# Patient Record
Sex: Male | Born: 1940 | Race: White | Hispanic: No | Marital: Single | Smoking: Former smoker
Health system: Southern US, Community
[De-identification: ages and names within clinical notes are randomized; demographics above are authoritative.]

## PROBLEM LIST (undated history)

## (undated) DIAGNOSIS — I509 Heart failure, unspecified: Secondary | ICD-10-CM

## (undated) DIAGNOSIS — M1990 Unspecified osteoarthritis, unspecified site: Secondary | ICD-10-CM

## (undated) DIAGNOSIS — N400 Enlarged prostate without lower urinary tract symptoms: Secondary | ICD-10-CM

## (undated) DIAGNOSIS — I10 Essential (primary) hypertension: Secondary | ICD-10-CM

## (undated) DIAGNOSIS — I499 Cardiac arrhythmia, unspecified: Secondary | ICD-10-CM

## (undated) DIAGNOSIS — I482 Chronic atrial fibrillation, unspecified: Secondary | ICD-10-CM

## (undated) DIAGNOSIS — E785 Hyperlipidemia, unspecified: Secondary | ICD-10-CM

## (undated) DIAGNOSIS — H547 Unspecified visual loss: Secondary | ICD-10-CM

## (undated) HISTORY — PX: KNEE SURGERY: SHX244

## (undated) HISTORY — PX: HERNIA REPAIR: SHX51

---

## 2004-01-20 ENCOUNTER — Other Ambulatory Visit: Payer: Self-pay

## 2009-10-21 ENCOUNTER — Emergency Department: Payer: Self-pay | Admitting: Emergency Medicine

## 2012-07-17 ENCOUNTER — Ambulatory Visit: Payer: Self-pay | Admitting: General Practice

## 2012-07-17 LAB — URINALYSIS, COMPLETE
Bilirubin,UR: NEGATIVE
Glucose,UR: NEGATIVE mg/dL (ref 0–75)
Ketone: NEGATIVE
Leukocyte Esterase: NEGATIVE
Specific Gravity: 1.026 (ref 1.003–1.030)
Squamous Epithelial: <1

## 2012-07-17 LAB — MRSA PCR SCREENING

## 2012-07-17 LAB — CBC
HCT: 43.7 % (ref 40.0–52.0)
HGB: 14.7 g/dL (ref 13.0–18.0)
MCH: 29.1 pg (ref 26.0–34.0)
MCHC: 33.6 g/dL (ref 32.0–36.0)
RBC: 5.05 10*6/uL (ref 4.40–5.90)
WBC: 6.2 10*3/uL (ref 3.8–10.6)

## 2012-07-17 LAB — BASIC METABOLIC PANEL
BUN: 23 mg/dL — ABNORMAL HIGH (ref 7–18)
Co2: 32 mmol/L (ref 21–32)
Creatinine: 1.13 mg/dL (ref 0.60–1.30)
EGFR (African American): >60
Glucose: 92 mg/dL (ref 65–99)
Sodium: 143 mmol/L (ref 136–145)

## 2012-07-17 LAB — PROTIME-INR: Prothrombin Time: 21.8 secs — ABNORMAL HIGH (ref 11.5–14.7)

## 2012-07-17 LAB — SEDIMENTATION RATE: Erythrocyte Sed Rate: 7 mm/hr (ref 0–20)

## 2012-08-14 ENCOUNTER — Inpatient Hospital Stay: Payer: Self-pay | Admitting: General Practice

## 2012-08-15 LAB — PROTIME-INR: INR: 1.3

## 2012-08-15 LAB — HEMOGLOBIN: HGB: 11.8 g/dL — ABNORMAL LOW (ref 13.0–18.0)

## 2012-08-15 LAB — BASIC METABOLIC PANEL
EGFR (African American): >60
EGFR (Non-African Amer.): >60
Sodium: 141 mmol/L (ref 136–145)

## 2012-08-15 LAB — PLATELET COUNT: Platelet: 131 10*3/uL — ABNORMAL LOW (ref 150–440)

## 2012-08-16 LAB — BASIC METABOLIC PANEL
Anion Gap: 2 — ABNORMAL LOW (ref 7–16)
BUN: 15 mg/dL (ref 7–18)
Calcium, Total: 7.8 mg/dL — ABNORMAL LOW (ref 8.5–10.1)
Chloride: 100 mmol/L (ref 98–107)
EGFR (African American): >60
Osmolality: 274 (ref 275–301)
Potassium: 3.1 mmol/L — ABNORMAL LOW (ref 3.5–5.1)
Sodium: 136 mmol/L (ref 136–145)

## 2012-08-16 LAB — PLATELET COUNT: Platelet: 138 10*3/uL — ABNORMAL LOW (ref 150–440)

## 2012-08-16 LAB — URIC ACID: Uric Acid: 6.7 mg/dL (ref 3.5–7.2)

## 2012-08-17 LAB — BASIC METABOLIC PANEL
Anion Gap: 0 — ABNORMAL LOW (ref 7–16)
Chloride: 99 mmol/L (ref 98–107)
Co2: 37 mmol/L — ABNORMAL HIGH (ref 21–32)
EGFR (African American): >60
EGFR (Non-African Amer.): 58 — ABNORMAL LOW

## 2012-08-18 ENCOUNTER — Encounter: Payer: Self-pay | Admitting: Internal Medicine

## 2012-08-18 LAB — PROTIME-INR
INR: 1.4
Prothrombin Time: 17.2 secs — ABNORMAL HIGH (ref 11.5–14.7)

## 2012-08-19 LAB — PROTIME-INR: Prothrombin Time: 19.3 secs — ABNORMAL HIGH (ref 11.5–14.7)

## 2012-08-22 ENCOUNTER — Encounter: Payer: Self-pay | Admitting: Internal Medicine

## 2013-01-02 ENCOUNTER — Ambulatory Visit: Payer: Self-pay | Admitting: General Practice

## 2013-01-02 LAB — APTT: Activated PTT: 31.8 secs (ref 23.6–35.9)

## 2013-01-02 LAB — CBC
HCT: 39.6 % — ABNORMAL LOW (ref 40.0–52.0)
HGB: 13.6 g/dL (ref 13.0–18.0)
MCH: 28.6 pg (ref 26.0–34.0)
Platelet: 180 10*3/uL (ref 150–440)
RBC: 4.75 10*6/uL (ref 4.40–5.90)
RDW: 14.4 % (ref 11.5–14.5)
WBC: 5.5 10*3/uL (ref 3.8–10.6)

## 2013-01-02 LAB — BASIC METABOLIC PANEL
Anion Gap: 6 — ABNORMAL LOW (ref 7–16)
BUN: 21 mg/dL — ABNORMAL HIGH (ref 7–18)
Calcium, Total: 9 mg/dL (ref 8.5–10.1)
Chloride: 101 mmol/L (ref 98–107)
Co2: 33 mmol/L — ABNORMAL HIGH (ref 21–32)
EGFR (African American): >60
EGFR (Non-African Amer.): 57 — ABNORMAL LOW
Osmolality: 286 (ref 275–301)
Sodium: 140 mmol/L (ref 136–145)

## 2013-01-02 LAB — URINALYSIS, COMPLETE
Bilirubin,UR: NEGATIVE
Blood: NEGATIVE
Glucose,UR: NEGATIVE mg/dL (ref 0–75)
Hyaline Cast: 3
Leukocyte Esterase: NEGATIVE
Nitrite: NEGATIVE
Squamous Epithelial: <1

## 2013-01-02 LAB — SEDIMENTATION RATE: Erythrocyte Sed Rate: 6 mm/hr (ref 0–20)

## 2013-01-02 LAB — PROTIME-INR: INR: 1.5

## 2013-01-15 ENCOUNTER — Inpatient Hospital Stay: Payer: Self-pay | Admitting: General Practice

## 2013-01-16 LAB — BASIC METABOLIC PANEL
Anion Gap: 4 — ABNORMAL LOW (ref 7–16)
Calcium, Total: 8.6 mg/dL (ref 8.5–10.1)
Co2: 32 mmol/L (ref 21–32)
EGFR (African American): >60
Glucose: 108 mg/dL — ABNORMAL HIGH (ref 65–99)
Osmolality: 283 (ref 275–301)
Potassium: 3.1 mmol/L — ABNORMAL LOW (ref 3.5–5.1)

## 2013-01-16 LAB — PLATELET COUNT: Platelet: 165 10*3/uL (ref 150–440)

## 2013-01-16 LAB — PROTIME-INR
INR: 1.2
Prothrombin Time: 15.1 s — ABNORMAL HIGH

## 2013-01-16 LAB — HEMOGLOBIN: HGB: 12.5 g/dL — ABNORMAL LOW (ref 13.0–18.0)

## 2013-01-17 LAB — BASIC METABOLIC PANEL WITH GFR
Anion Gap: 6 — ABNORMAL LOW
BUN: 18 mg/dL
Calcium, Total: 8.9 mg/dL
Chloride: 100 mmol/L
Co2: 30 mmol/L
Creatinine: 1.16 mg/dL
EGFR (African American): >60
EGFR (Non-African Amer.): >60
Glucose: 133 mg/dL — ABNORMAL HIGH
Osmolality: 276
Potassium: 2.9 mmol/L — ABNORMAL LOW
Sodium: 136 mmol/L

## 2013-01-18 LAB — BASIC METABOLIC PANEL
Anion Gap: 5 — ABNORMAL LOW (ref 7–16)
Calcium, Total: 8.8 mg/dL (ref 8.5–10.1)
Co2: 34 mmol/L — ABNORMAL HIGH (ref 21–32)
Creatinine: 1.3 mg/dL (ref 0.60–1.30)
EGFR (Non-African Amer.): 54 — ABNORMAL LOW
Glucose: 104 mg/dL — ABNORMAL HIGH (ref 65–99)
Osmolality: 282 (ref 275–301)
Potassium: 3.2 mmol/L — ABNORMAL LOW (ref 3.5–5.1)
Sodium: 139 mmol/L (ref 136–145)

## 2013-01-18 LAB — PROTIME-INR: INR: 1.3

## 2013-04-16 ENCOUNTER — Emergency Department: Payer: Self-pay | Admitting: Emergency Medicine

## 2013-04-16 LAB — PROTIME-INR
INR: 1.4
Prothrombin Time: 17.1 secs — ABNORMAL HIGH (ref 11.5–14.7)

## 2014-09-13 NOTE — Op Note (Signed)
PATIENT NAME:  Luis Byrd, Luis Byrd MR#:  [id number] DATE OF BIRTH:  1940-07-16  DATE OF PROCEDURE:  08/14/2012   PREOPERATIVE DIAGNOSIS: Degenerative arthrosis of the left knee.   POSTOPERATIVE DIAGNOSIS: Degenerative arthrosis of the left knee.   PROCEDURE PERFORMED: Left total knee arthroplasty using computer-assisted navigation.   SURGEON: Fredirick MaudlinJames P. James Hooten Jr., M.D.   ASSISTANT: Van ClinesJon Wolfe, PA.   ANESTHESIA: Femoral nerve block and spinal.   ESTIMATED BLOOD LOSS: 100 mL.   FLUIDS REPLACED: 1300 mL of crystalloid.   TOURNIQUET TIME: 84 minutes.   DRAINS: Two medium drains to reinfusion system.   SOFT TISSUE RELEASES: Anterior cruciate ligament, posterior cruciate ligament, deep medial collateral ligament and patellofemoral ligament.   IMPLANTS UTILIZED: DePuy PFC Sigma size 4 posterior stabilized femoral component (cemented), size 4, MBT tibial component (cemented), 38 mm, 3-peg oval dome patella (cemented), and a 12.5 mm stabilized rotating platform polyethylene insert.   INDICATIONS FOR SURGERY: The patient is a 74 year old male who has been seen for complaints of progressive left knee pain. X-rays demonstrated severe degenerative changes in a tricompartmental fashion with varus deformity. After discussion of the risks and benefits of surgical intervention, the patient expressed understanding of the risks and benefits and agreed with plans for surgical intervention.   PROCEDURE IN DETAIL: The patient was brought to the Operating Room and after adequate femoral nerve block and spinal anesthesia was achieved, a tourniquet was placed on the patient's upper left thigh. The patient's left knee and leg were cleaned and prepped with alcohol and DuraPrep draped in the usual sterile fashion. A "timeout" was performed as per usual protocol. The left lower extremity was exsanguinated using an Esmarch, and the tourniquet was inflated to 300 mmHg. An anterior longitudinal incision was made  followed by a standard mid vastus approach. A moderate effusion was evacuated. The deep fibers of the medial collateral ligament were elevated in a subperiosteal fashion off the medial flare of the tibia so as to maintain a continuous soft tissue sleeve. The patella was subluxed laterally, and the patellofemoral ligament was incised. Inspection of the knee demonstrated severe degenerative changes in tricompartmental fashion with full thickness loss of articular cartilage to the medial compartment and relative erosion to the medial tibial plateau. Prominent osteophytes were debrided using a rongeur. The anterior and posterior cruciate ligaments were excised. Two 4.0 mm Schanz pins were inserted into the femur and into the tibia for attachment of the array of trackers used for computer-assisted navigation. Hip center was identified using circumduction technique, and distal landmarks were mapped using the computer. The distal femur and proximal tibia were mapped using the computer. Distal femoral cutting guide was positioned using computer-assisted navigation so as to achieve a 5-degree distal valgus cut. Cut was performed and verified using the computer. The distal femur was sized, and it was felt that a size 4 femoral component was appropriate. A size 4 cutting guide was positioned, and the anterior cut was performed and verified using computer. This was followed by completion of the posterior and chamfer cuts. Femoral cutting guide for a central box was then positioned, and the central box cut was performed.   Attention was then directed to the proximal tibia. The medial and lateral menisci were excised. The extramedullary tibial cutting guide was positioned using computer-assisted navigation so as to achieve a 0-degree varus valgus alignment and a 0-degree posterior slope. The cut was performed and verified using the computer. The proximal tibial was sized, and it was felt  that a size 4 tibial tray was  appropriate. Tibial and femoral trials were inserted followed by insertion of first a 10 and subsequently a 12.5 mm polyethylene insert. Excellent mediolateral soft tissue balancing was appreciated both in extension and in flexion. Finally, the patella was cut and prepared so as to accommodate a 38 mm, 3-peg oval dome patella. Patellar trial was placed, and the knee was placed through a range of motion with excellent patellar tracking appreciated.   The femoral component was removed after debridement of posterior osteophytes. Central _____ for the tibial component was reamed followed by insertion of a keel punch. The tibial tray was then removed. The cut surfaces of bone were irrigated with copious amounts of normal saline with antibiotic solution using pulsatile lavage and then suctioned dry. Polymethyl methacrylate cement was prepared in the usual fashion using a vacuum mixer. Cement was applied to the cut surface of the proximal tibia as well as along the undersurface of a size 4 MBT tibial component. The tibial component was positioned and impacted into place. Excess cement was removed using freer elevators. Cement was then applied to the cut surface of the femur as well as along the posterior flanges of the size 4 posterior stabilized femoral component. The femoral component was positioned and impacted into place. Excess cement was removed using freer elevators. A 12.5 mm polyethylene trial was inserted, and the knee was brought into full extension with steady axial compression applied. Finally, cement was applied to the backside of a 38 mm, 3-peg oval dome patella, and the patellar component was positioned and patellar clamp applied. Excess cement was removed using freer elevators.   After adequate curing of cement, the tourniquet was deflated after total tourniquet time of 84 minutes. Hemostasis was achieved using electrocautery. The knee was irrigated with copious amounts of normal saline with antibiotic  solution using pulsatile lavage and then suctioned dry. The knee was inspected for any residual cement debris. Then, 30 mL of 0.25% Marcaine with epinephrine was injected along the posterior capsule. A 12.5 mm stabilized rotating platform polyethylene insert was inserted, and the knee was placed through a range of motion. Excellent mediolateral soft tissue balancing was appreciated both in full extension and in flexion. Excellent patellar tracking was appreciated. Two medium drains were placed in the wound bed and brought out through a separate stab incision to be attached to a reinfusion system. The medial parapatellar portion of the incision was reapproximated using interrupted sutures of #1 Vicryl. The subcutaneous tissue was approximated using first #0 Vicryl followed by 2-0 Vicryl. The skin was closed with skin staples. Sterile dressing was applied.   The patient tolerated the procedure well. He was transported to the recovery room in stable condition.    ____________________________ Illene Labrador. Angie Fava., MD [email or web address] D: 08/14/2012 10:47:29 ET T: 08/14/2012 10:58:59 ET JOB#: [id number]  cc: Illene Labrador. Angie Fava., MD, <Dictator> Illene Labrador Angie Fava MD ELECTRONICALLY SIGNED 08/19/2012 12:29

## 2014-09-13 NOTE — Discharge Summary (Signed)
PATIENT NAME:  Luis RamsayROUP, Khallid B MR#:  [id number] DATE OF BIRTH:  02-Nov-1940  DATE OF ADMISSION:  01/15/2013 DATE OF DISCHARGE:  01/18/2013  ADMITTING DIAGNOSIS: Degenerative arthrosis of the right knee.   DISCHARGE DIAGNOSIS: Degenerative arthritis of the right knee.   OPERATION: On 01/15/2013, the patient had a right total knee arthroplasty using computer-aided navigation.   SURGEON: Dr. Ernest PineHooten, M.D.   ASSISTANT: Van ClinesJon Wolfe, P.A.  ANESTHESIA: Spinal.   ESTIMATED BLOOD LOSS: 150 mL.   TOURNIQUET TIME: 112 minutes.   IMPLANTS USED: DePuy PFC Sigma size 4 posterior-stabilized femoral component that was cemented, size four MBT tibial component (cemented), 38 mm 3-pegged oval dome patella that was cemented, a 10 mm stabilized rotating platform polyethylene insert.   The patient was stabilized, brought to the recovery room, brought down to the orthopedic floor where he was treated for pain control and physical therapy.   HISTORY: The patient is a 74 year old male who presented for an upcoming total knee replacement on the right knee. The patient has been refractory to conservative management. The patient is status post left total knee arthroplasty about 4 months ago and did very well.   PHYSICAL EXAMINATION: GENERAL: Alert male, with a slight antalgic component involving the right knee. The patient does have some mild stiffness in the left knee.   LUNGS: Clear to auscultation.  CARDIAC: Regular rate and rhythm.  MUSCULOSKELETAL: In regard to the right knee the patient has -10 degrees of extension and 120 degrees of flexion. The patient has medial joint line tenderness, with crepitance.   HOSPITAL COURSE: After initial admission after surgery the patient was brought to the ortho  floor after surgery. The patient had a hemoglobin of 12.5 on postoperative day 1, which remained stable there. The patient did have a potassium of 3.1 on postoperative day 1, which dropped down to 2.9 on postoperative  day 2. They increased his K-Dur t.i.d., which increased his potassium to 3.2. The patient also had his lisinopril and hydrochlorothiazide held, and did well. The patient had a protime INR which at 1.2 after surgery, and up to 1.3 before discharge. The patient will go home on Coumadin, and no Lovenox temporarily.   CONDITION AT DISCHARGE: Stable.   DISPOSITION: The patient was sent home with home health physical therapy.   DISCHARGE INSTRUCTIONS: [facility] with Van ClinesJon Wolfe. The patient will do physical therapy, doing gait training and range of motion activities. The patient is to weight-bear as tolerated. The patient will elevate the affected leg on 1 to 2 pillows and use thigh-high TED hose on both legs, removed at bedtime. The patient will elevate his heels off the bed.   The patient's diet is regular.   The patient will use Polar Care to decrease swelling. The patient will keep his dressing clean and try not to get it wet.   The patient will call the clinic if there is any bright red bleeding, calf pain, bowel or bladder difficulty, or fever greater than 101.5.   DISCHARGE MEDICATIONS: Resume home medications, except for oxycodone 5 mg 1 tablet q.4h. as needed for severe pain. Celebrex 2 mg b.i.d.     ____________________________ J. Dedra Skeensodd Rivky Clendenning, [place] [email or web address]:dm D: 01/18/2013 07:33:03 ET T: 01/18/2013 08:50:30 ET JOB#: [id number]  cc: J. Dedra Skeensodd Ceniya Fowers, [place]PA, <Dictator> J Caspian Deleonardis [facility] PA ELECTRONICALLY SIGNED 02/03/2013 6:23

## 2014-09-13 NOTE — Discharge Summary (Signed)
PATIENT NAME:  Luis Byrd, Luis Byrd MR#:  [id number] DATE OF BIRTH:  [birth year]  DATE OF ADMISSION:  08/14/2012 DATE OF DISCHARGE:  08/19/2012   DICTATING FOR: Fayrene Fearing P. Hooten, M.D.  ADMITTING DIAGNOSIS: Degenerative arthrosis of the left knee.   DISCHARGE DIAGNOSES:  1.  Degenerative arthrosis of the left knee. 2.  Pain, ball of foot bilaterally with swelling felt to be gout versus tendinitis.   HISTORY: The patient is a 74 year old who has been followed at [facility] for progression of bilateral knee pain for several years. Stated that the left knee was more symptomatic than the right. The patient had been treated with meloxicam as well as also received intra-articular cortisone injections, Hyalgan injections and Synvisc injections and had also been fitted with an osteoarthritis unloading knee brace. The patient had reported swelling of the left knee as well as near giving way. He had also localized most of the pain along the medial aspect of the knee. His pain was aggravated with weight-bearing activities. At the time of surgery, he was not using any ambulatory aid. The pain had increased to the point that it was significantly interfering with his activities of daily living. X-rays taken in [facility] showed narrowing of the medial cartilage space, with bone-on-bone articulation noted as well as patient being associated with some varus alignment. Osteophyte as well as subchondral sclerosis was noted. He was noted to have degenerative changes to the patellofemoral articulation as well. After discussion of the risks and benefits of surgical intervention, the patient expressed his understanding of the risks and benefits and agreed for surgical intervention.   PROCEDURE: Left total knee arthroplasty using computer-assisted navigation.   ANESTHESIA: Femoral nerve block with spinal.   SOFT TISSUE RELEASE: Anterior cruciate ligament, posterior cruciate ligament, deep medial collateral  ligaments as well as patellofemoral ligament.   IMPLANTS UTILIZED: DePuy PFC Sigma size 4 posterior stabilized femoral component (cemented), size 4 MBT tibial component (cemented), 38 mm 3-pegged oval dome patella (cemented), and a 12.5 mm stabilized rotating platform polyethylene insert.   HOSPITAL COURSE: The patient tolerated the procedure very well. He had no complications. He was then taken to PACU where he was stabilized and then transferred to the orthopedic floor. The patient began receiving anticoagulation therapy of Coumadin 7.5 mg q. 5:00 p.m. His INR was monitored closely. Upon being discharged, INR was 1.4. The patient was also fitted with TED stockings bilaterally. These were allowed to be removed 1 hour per 8-hour shift. The left one was applied on day 2 following removal of the Hemovac and dressing change. The patient was also fitted with the AV-I compression foot pumps set at 80 mm mmHg. These had to be discontinued after the first day secondary to patient complaining of pain to the feet. He has had no evidence of any DVTs of the lower extremities. Calves have been nontender. Negative Homans sign. Heels were elevated off the bed using rolled towels.   The patient has denied any chest pains or any shortness of breath. Vital signs have been stable. He has been afebrile. Hemodynamically he was stable. No transfusions were given other than the Autovac transfusion given the first 6 hours postoperatively.   The patient did complain of severe pain to the balls of the feet bilaterally as well as swelling. This occurred on day 2. He was noted to have some pain to the dorsum aspect of the foot at that time as well. He was having problems with dorsiflexion of the foot  and plantar flexion because of the pain. This was also being treated with passive motion. It was felt that that time to be secondary to tendinitis from the boots. However, the patient insisted that a uric acid level be drawn. He felt he  had a gout attack. There was no family history of any gout in his family, but a uric acid level was performed which was 6.7, which was at the upper limits of normal. He was voicing no other joint complaints.   Physical therapy was initiated on day 1 for gait training and transfers. He did extremely well the first day postoperatively and was ambulating very well without any problems, had good range of motion. However, his feet caused him to have difficulty with ambulation and this slowed his rehab down tremendously. The patient was walking 95 feet on day 1. Upon being discharged, this was significantly less, but had improved over the last several days. His feet also had improved as far as the swelling and discomfort. He was placed on Celebrex and ice was applied to the feet. Also, the foot of the bed was elevated along with the heels been elevated off the bed. Occupational therapy was also initiated on day 1 for ADLs and assistive devices.   The patient's IV, Foley and Hemovac were discontinued on day 2 along with a dressing change. The wound was free of any drainage or signs of any infection.   DRUG ALLERGIES: No known drug allergies.   MEDICATIONS: Norvasc 10 mg b.i.d., Celebrex 200 mg b.i.d., Lanoxin 0.25 mg daily, Senokot-S 1 tablet b.i.d., hydrochlorothiazide 25 mg daily, multivitamin, B-complex with C and folic acid 2 tablets daily, saw palmetto 2 capsules daily, pantoprazole 40 mg b.i.d., potassium chloride ER 20 mEq daily, Coumadin 7.5 mg q. 5:00 p.m., Tylenol ES 500 to 1000 mg q.4-6 hours p.r.n., Mylanta DS 30 mL q.6 hours p.r.n., Dulcolax suppositories 10 mg rectally daily p.r.n., milk of magnesia 30 mL b.i.d. p.r.n., Roxicodone 5 to 10 mg q.4-6 hours p.r.n. for pain, Ultram 50 to 100 mg q.4-6 hours p.r.n. for pain, enema soapsuds if no results with milk of magnesia or Dulcolax.   PAST MEDICAL HISTORY: Atrial fibrillation, congestive heart failure, dilated cardiomyopathy, hyperlipidemia,  thrombocytopenia, leukopenia, benign prostatic hypertrophy, venous stasis with peripheral edema, hypertension, arthritis.    ____________________________ Van ClinesJon Dymphna Wadley, PA [id number] D: 08/18/2012 13:00:00 ET T: 08/18/2012 13:37:18 ET JOB#: [id number]  cc: Van ClinesJon Dejon Lukas, PA, <Dictator> Corinthian Kemler PA ELECTRONICALLY SIGNED 08/19/2012 8:03

## 2014-09-13 NOTE — Op Note (Signed)
PATIENT NAME:  Luis Byrd, Luis Byrd MR#:  [id number] DATE OF BIRTH:  Jul 10, 1940  DATE OF PROCEDURE:  01/15/2013  PREOPERATIVE DIAGNOSIS: Degenerative arthrosis of the right knee.   POSTOPERATIVE DIAGNOSIS: Degenerative arthrosis of the right knee.   PROCEDURE PERFORMED: Right total knee arthroplasty using computer-assisted navigation.   SURGEON: Illene Labrador. Hooten, MD  ASSISTANT: Van Clines, PA (required to maintain retraction throughout the procedure)   ANESTHESIA: Spinal.   ESTIMATED BLOOD LOSS: 150 mL.   FLUIDS REPLACED: 1000 mL of crystalloid.   TOURNIQUET TIME: 112 minutes.   DRAINS: Two medium drains to reinfusion system.   SOFT TISSUE RELEASES: Anterior cruciate ligament, posterior cruciate ligament, deep medial collateral ligament, and patellofemoral ligament.   IMPLANTS UTILIZED: DePuy PFC Sigma size 4 posterior stabilized femoral component (cemented), size 4 MBT tibial component (cemented), 38 mm three-peg oval dome patella (cemented), and a 10 mm stabilized rotating platform polyethylene insert.   INDICATIONS FOR SURGERY: The patient is a 74 year old gentleman who has been seen for complaints of progressive right knee pain. X-rays demonstrated severe degenerative changes with varus deformity. He had previously undergone successful left total knee arthroplasty. The risks and benefits of surgical intervention were discussed with the patient. He expressed understanding of the risks and benefits and agreed with plans for surgical intervention.   PROCEDURE IN DETAIL: The patient was brought to the operating room and, after adequate spinal anesthesia was achieved, a tourniquet was placed on the patient's upper right thigh. The patient's right knee and leg were cleaned and prepped with alcohol and DuraPrep and draped in the usual sterile fashion. A "timeout" was performed as per usual protocol. The right lower extremity was exsanguinated using an Esmarch, and the tourniquet was inflated to 300  mmHg. Anterior longitudinal incision was made followed by a standard mid vastus approach. A moderate effusion was evacuated. Deep fibers of the medial collateral ligament were elevated in subperiosteal fashion off the medial flare of the tibia so as to maintain a continuous soft tissue sleeve. The patella was subluxed laterally, and the patellofemoral ligament was incised. Inspection of the knee demonstrated severe degenerative change with full thickness loss of articular cartilage to the medial compartment. Prominent osteophytes were debrided using a rongeur. Anterior and posterior cruciate ligaments were excised. Two 4.0 mm Schanz pins were inserted into the femur and into the tibia for attachment of the ray of trackers used for computer-assisted navigation. Hip center was identified using circumduction technique. Distal landmarks were mapped using the computer. The distal femur and proximal tibia were mapped using the computer. When the distal femoral cutting guide was positioned, there was an obvious discrepancy between calculated resection levels as per the computer navigation and the actual cuts. It was thus elected to go back and remap the leg with hip center first identified using a circumduction technique, and the distal landmarks were mapped followed by mapping of the distal femur and proximal tibia. The distal femoral cutting guide was again positioned so as to achieve a 5 degree distal valgus cut. More appropriate resection levels were appreciated and cut was performed and verified using the computer. Distal femur was sized and it was felt that a size 4 femoral component was appropriate. A size 4 cutting guide was positioned and the anterior cut was performed and verified using the computer. This was followed by completion of the posterior and chamfer cuts. The femoral cutting guide for the central box was then positioned, and the central box cut was performed.   Attention  was then directed to the  proximal tibia. Medial and lateral menisci were excised. The extramedullary tibial cutting guide was positioned using computer-assisted navigation so as to achieve 0 degree varus valgus alignment and 0 degrees posterior slope. Cut was performed and verified using the computer. The proximal tibia was sized and it was felt that a size 4 tibial tray was appropriate. Tibial and femoral trials were inserted followed by insertion of a 10 mm polyethylene insert. Excellent mediolateral soft tissue balancing was appreciated both in full extension and in flexion. Finally, the patella was cut and prepared so as to accommodate a 38 mm three-peg oval dome patella. Patellar trial was placed and the knee was placed through a range of motion with excellent patellar tracking appreciated. The femoral trial was removed. Central post hole for the tibial component was reamed followed by insertion of a keel punch. Tibial trial was then removed. The cut surfaces of bone were irrigated with copious amounts of normal saline with antibiotic solution using pulsatile lavage and then suctioned dry. Polymethyl methacrylate cement with gentamicin was prepared in the usual fashion using a vacuum mixer. Cement was applied to the cut surface of the proximal tibia as well as along the undersurface of a size 4 MBT tibial component. The tibial component was positioned and impacted into place. Excess cement was removed using Personal assistantreer elevators. Cement was then applied to the cut surface of the femur as well as along the posterior flanges of a size 4 posterior stabilized femoral component. Femoral component was positioned and impacted into place. Excess cement was removed using freer elevators. A 10 mm stabilized rotating platform polyethylene insert was inserted and the knee was brought in full extension with steady axial compression applied. Finally, cement was applied to the backside of a 38 mm three-peg oval dome patella, and the patellar component was  positioned and patellar clamp applied. Excess cement was removed using freer elevators.   After adequate curing of cement, the tourniquet was deflated after total tourniquet time of 112 minutes. Hemostasis was achieved using electrocautery. The knee was irrigated with copious amounts of normal saline with antibiotic solution using pulsatile lavage and then suctioned dry. The knee was inspected for any residual cement debris. 20 mL of Exparel and 40 mL of normal saline was injected along the posterior capsule as well as along the medial and lateral gutters and along the arthrotomy site. A 10 mm stabilized rotating platform polyethylene insert was inserted and the knee was placed through range of motion. Again, excellent medial and lateral soft tissue balancing was appreciated and excellent patellar tracking appreciated. Two medium drains were placed in the wound bed and brought out through a separate stab incision to be attached to a reinfusion system. The medial parapatellar portion of the incision was reapproximated using interrupted sutures of #1 Vicryl. The subcutaneous tissue was approximated in layers using first #0 Vicryl followed by #2-0 Vicryl. Skin was closed with skin staples. A sterile dressing was applied.   The patient tolerated the procedure well. He was transported to the recovery room in stable condition.  ____________________________ Illene LabradorJames P. Angie FavaHooten Jr., MD jph:sb D: 01/15/2013 21:56:48 ET T: 01/16/2013 07:05:14 ET JOB#: [id number]  cc: Illene LabradorJames P. Angie FavaHooten Jr., MD, <Dictator> JAMES P Angie FavaHOOTEN JR MD ELECTRONICALLY SIGNED 01/31/2013 7:13

## 2015-09-17 ENCOUNTER — Other Ambulatory Visit: Payer: Self-pay | Admitting: Ophthalmology

## 2015-09-17 ENCOUNTER — Other Ambulatory Visit
Admission: RE | Admit: 2015-09-17 | Discharge: 2015-09-17 | Disposition: A | Discharge status: Home / Self Care | Payer: Medicare Other | Source: Ambulatory Visit | Attending: Ophthalmology | Admitting: Ophthalmology

## 2015-09-17 DIAGNOSIS — H3412 Central retinal artery occlusion, left eye: Secondary | ICD-10-CM | POA: Insufficient documentation

## 2015-09-17 LAB — CBC WITH DIFFERENTIAL/PLATELET
BASOS PCT: 0 %
Basophils Absolute: 0 10*3/uL (ref 0–0.1)
EOS ABS: 0 10*3/uL (ref 0–0.7)
Eosinophils Relative: 1 %
HCT: 40.5 % (ref 40.0–52.0)
Hemoglobin: 13.8 g/dL (ref 13.0–18.0)
LYMPHS ABS: 0.7 10*3/uL — AB (ref 1.0–3.6)
Lymphocytes Relative: 12 %
MCH: 29.5 pg (ref 26.0–34.0)
MCHC: 34.1 g/dL (ref 32.0–36.0)
MCV: 86.5 fL (ref 80.0–100.0)
Monocytes Absolute: 0.3 10*3/uL (ref 0.2–1.0)
Monocytes Relative: 6 %
NEUTROS PCT: 81 %
Neutro Abs: 4.5 10*3/uL (ref 1.4–6.5)
Platelets: 145 10*3/uL — ABNORMAL LOW (ref 150–440)
RBC: 4.68 MIL/uL (ref 4.40–5.90)
RDW: 14.7 % — AB (ref 11.5–14.5)
WBC: 5.6 10*3/uL (ref 3.8–10.6)

## 2015-09-17 LAB — SEDIMENTATION RATE: SED RATE: 5 mm/h (ref 0–20)

## 2015-09-17 LAB — C-REACTIVE PROTEIN: CRP: 0.6 mg/dL (ref <1.0)

## 2015-09-18 LAB — PAN-ANCA: Atypical P-ANCA titer: <1:20 {titer}

## 2015-09-25 ENCOUNTER — Ambulatory Visit
Admission: RE | Admit: 2015-09-25 | Discharge: 2015-09-25 | Disposition: A | Discharge status: Home / Self Care | Payer: Medicare Other | Source: Ambulatory Visit | Attending: Ophthalmology | Admitting: Ophthalmology

## 2015-09-25 DIAGNOSIS — G360 Neuromyelitis optica [Devic]: Secondary | ICD-10-CM | POA: Diagnosis not present

## 2015-09-25 DIAGNOSIS — I6523 Occlusion and stenosis of bilateral carotid arteries: Secondary | ICD-10-CM

## 2015-09-25 DIAGNOSIS — H3412 Central retinal artery occlusion, left eye: Secondary | ICD-10-CM

## 2015-09-25 DIAGNOSIS — H5461 Unqualified visual loss, right eye, normal vision left eye: Secondary | ICD-10-CM | POA: Diagnosis not present

## 2015-09-27 ENCOUNTER — Emergency Department: Payer: Medicare Other

## 2015-09-27 ENCOUNTER — Inpatient Hospital Stay
Admission: EM | Admit: 2015-09-27 | Discharge: 2015-10-01 | DRG: 059 | Disposition: A | Discharge status: Other Acute Inpatient Hospital | Payer: Medicare Other | Attending: Internal Medicine | Admitting: Internal Medicine

## 2015-09-27 DIAGNOSIS — Z96653 Presence of artificial knee joint, bilateral: Secondary | ICD-10-CM | POA: Diagnosis present

## 2015-09-27 DIAGNOSIS — I482 Chronic atrial fibrillation, unspecified: Secondary | ICD-10-CM | POA: Diagnosis present

## 2015-09-27 DIAGNOSIS — G360 Neuromyelitis optica [Devic]: Principal | ICD-10-CM | POA: Diagnosis present

## 2015-09-27 DIAGNOSIS — E785 Hyperlipidemia, unspecified: Secondary | ICD-10-CM | POA: Diagnosis present

## 2015-09-27 DIAGNOSIS — R42 Dizziness and giddiness: Secondary | ICD-10-CM

## 2015-09-27 DIAGNOSIS — Z79899 Other long term (current) drug therapy: Secondary | ICD-10-CM

## 2015-09-27 DIAGNOSIS — R946 Abnormal results of thyroid function studies: Secondary | ICD-10-CM | POA: Diagnosis present

## 2015-09-27 DIAGNOSIS — H53461 Homonymous bilateral field defects, right side: Secondary | ICD-10-CM | POA: Diagnosis present

## 2015-09-27 DIAGNOSIS — H469 Unspecified optic neuritis: Secondary | ICD-10-CM | POA: Diagnosis present

## 2015-09-27 DIAGNOSIS — H9190 Unspecified hearing loss, unspecified ear: Secondary | ICD-10-CM | POA: Diagnosis present

## 2015-09-27 DIAGNOSIS — Z87891 Personal history of nicotine dependence: Secondary | ICD-10-CM

## 2015-09-27 DIAGNOSIS — I10 Essential (primary) hypertension: Secondary | ICD-10-CM | POA: Diagnosis present

## 2015-09-27 DIAGNOSIS — Z7952 Long term (current) use of systemic steroids: Secondary | ICD-10-CM | POA: Diagnosis not present

## 2015-09-27 DIAGNOSIS — E7800 Pure hypercholesterolemia, unspecified: Secondary | ICD-10-CM | POA: Diagnosis present

## 2015-09-27 DIAGNOSIS — H3410 Central retinal artery occlusion, unspecified eye: Secondary | ICD-10-CM | POA: Diagnosis present

## 2015-09-27 DIAGNOSIS — I639 Cerebral infarction, unspecified: Secondary | ICD-10-CM | POA: Diagnosis present

## 2015-09-27 DIAGNOSIS — Z7901 Long term (current) use of anticoagulants: Secondary | ICD-10-CM | POA: Diagnosis not present

## 2015-09-27 DIAGNOSIS — R519 Headache, unspecified: Secondary | ICD-10-CM

## 2015-09-27 DIAGNOSIS — H3412 Central retinal artery occlusion, left eye: Secondary | ICD-10-CM | POA: Diagnosis present

## 2015-09-27 DIAGNOSIS — H5461 Unqualified visual loss, right eye, normal vision left eye: Secondary | ICD-10-CM | POA: Diagnosis present

## 2015-09-27 DIAGNOSIS — R51 Headache: Secondary | ICD-10-CM

## 2015-09-27 DIAGNOSIS — H547 Unspecified visual loss: Secondary | ICD-10-CM

## 2015-09-27 HISTORY — DX: Unspecified osteoarthritis, unspecified site: M19.90

## 2015-09-27 HISTORY — DX: Heart failure, unspecified: I50.9

## 2015-09-27 HISTORY — DX: Benign prostatic hyperplasia without lower urinary tract symptoms: N40.0

## 2015-09-27 HISTORY — DX: Essential (primary) hypertension: I10

## 2015-09-27 HISTORY — DX: Chronic atrial fibrillation, unspecified: I48.20

## 2015-09-27 HISTORY — DX: Hyperlipidemia, unspecified: E78.5

## 2015-09-27 LAB — BASIC METABOLIC PANEL
Anion gap: 10 (ref 5–15)
BUN: 27 mg/dL — ABNORMAL HIGH (ref 6–20)
CALCIUM: 9.7 mg/dL (ref 8.9–10.3)
CO2: 30 mmol/L (ref 22–32)
CREATININE: 1.66 mg/dL — AB (ref 0.61–1.24)
Chloride: 100 mmol/L — ABNORMAL LOW (ref 101–111)
GFR, EST AFRICAN AMERICAN: 45 mL/min — AB (ref >60)
GFR, EST NON AFRICAN AMERICAN: 38 mL/min — AB (ref >60)
Glucose, Bld: 149 mg/dL — ABNORMAL HIGH (ref 65–99)
Potassium: 3.2 mmol/L — ABNORMAL LOW (ref 3.5–5.1)
Sodium: 140 mmol/L (ref 135–145)

## 2015-09-27 LAB — CBC WITH DIFFERENTIAL/PLATELET
BASOS ABS: 0 10*3/uL (ref 0–0.1)
BASOS PCT: 1 %
EOS ABS: 0.1 10*3/uL (ref 0–0.7)
Eosinophils Relative: 2 %
HCT: 43.6 % (ref 40.0–52.0)
HEMOGLOBIN: 14.5 g/dL (ref 13.0–18.0)
Lymphocytes Relative: 15 %
Lymphs Abs: 0.9 10*3/uL — ABNORMAL LOW (ref 1.0–3.6)
MCH: 29 pg (ref 26.0–34.0)
MCHC: 33.2 g/dL (ref 32.0–36.0)
MCV: 87.4 fL (ref 80.0–100.0)
MONOS PCT: 8 %
Monocytes Absolute: 0.4 10*3/uL (ref 0.2–1.0)
NEUTROS ABS: 4.2 10*3/uL (ref 1.4–6.5)
NEUTROS PCT: 74 %
Platelets: 142 10*3/uL — ABNORMAL LOW (ref 150–440)
RBC: 4.99 MIL/uL (ref 4.40–5.90)
RDW: 14.7 % — ABNORMAL HIGH (ref 11.5–14.5)
WBC: 5.7 10*3/uL (ref 3.8–10.6)

## 2015-09-27 LAB — PROTIME-INR
INR: 3.36
PROTHROMBIN TIME: 33.3 s — AB (ref 11.4–15.0)

## 2015-09-27 MED ORDER — TETRACAINE HCL 0.5 % OP SOLN
OPHTHALMIC | Status: AC
  Administered 2015-09-27: 23:00:00
  Filled 2015-09-27: qty 2

## 2015-09-27 MED ORDER — PREDNISONE 10 MG PO TABS
10.0000 mg | ORAL_TABLET | Freq: Every day | ORAL | Status: DC
  Administered 2015-09-28: 10 mg via ORAL
  Filled 2015-09-27: qty 1

## 2015-09-27 MED ORDER — ONDANSETRON HCL 4 MG/2ML IJ SOLN
4.0000 mg | Freq: Once | INTRAMUSCULAR | Status: AC
  Administered 2015-09-27: 4 mg via INTRAVENOUS
  Filled 2015-09-27: qty 2

## 2015-09-27 MED ORDER — STROKE: EARLY STAGES OF RECOVERY BOOK
Freq: Once | Status: AC
  Administered 2015-09-27

## 2015-09-27 MED ORDER — FLUORESCEIN SODIUM 1 MG OP STRP
ORAL_STRIP | OPHTHALMIC | Status: AC
  Administered 2015-09-27: 23:00:00
  Filled 2015-09-27: qty 1

## 2015-09-27 MED ORDER — OXYCODONE-ACETAMINOPHEN 5-325 MG PO TABS
1.0000 | ORAL_TABLET | Freq: Once | ORAL | Status: AC
  Administered 2015-09-27: 1 via ORAL
  Filled 2015-09-27: qty 1

## 2015-09-27 NOTE — ED Notes (Signed)
Pt. Able to ambulate to bathroom without difficulty.

## 2015-09-27 NOTE — ED Notes (Signed)
Pt saw eye dr for vision loss in left eye 2-3 weeks ago and was told that he had an "eye stroke" - had an us of his bil carotids two days ago. Awoke today with decreased vision to his rt eye.

## 2015-09-27 NOTE — ED Provider Notes (Signed)
[facility] Emergency Department Provider Note   ____________________________________________  Time seen: Approximately 6pm I have reviewed the triage vital signs and the triage nursing note.  HISTORY  Chief Complaint Eye Problem   Historian Patient, somewhat poor historian, difficult to obtain the history Family members provided some history as well.. He has not had a head CT or MRI of the brain or echocardiogram of the heart.  HPI Luis Byrd is a 75 y.o. 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  He's taken coumadin for years.  He's had left eye pain and headache for which he's been prescribed oxycodone.  4 days ago he reports starting to have a milky problem with right eye vision, similar to how the CRAO on the left started, and has progressed to dim vision on the right. Today he is experienced some lightheadedness and dizziness. Not exactly off balance, or any weakness or numbness or slurred speech.  He called to [facility] and Dr. Brooke DareKing referred patient to come to the ED for evaluation of possible stroke given the complaint now that includes dizziness in addition to the vision changes, which are suspected to be CRAO.  He has had an ultrasound of the neck which was reportedly negative, but no CT of the head or MRI of the head.    Past Medical History  Diagnosis Date  . Hypertension   . Arthritis     There are no active problems to display for this patient.   Past Surgical History  Procedure Laterality Date  . Hernia repair    . Knee surgery      Current Outpatient Rx  Name  Route  Sig  Dispense  Refill  . amLODipine (NORVASC) 10 MG tablet   Oral   Take 10 mg by mouth 2 (two) times daily.         Marland Kitchen. DIGOX 125 MCG tablet   Oral   Take 125 mcg by mouth daily.           Dispense as written.   . docusate sodium (COLACE) 50 MG capsule   Oral   Take 150 mg  by mouth at bedtime.          . Glucosamine-Chondroitin 250-200 MG TABS   Oral   Take 2 tablets by mouth daily.         . hydrochlorothiazide (HYDRODIURIL) 25 MG tablet   Oral   Take 25 mg by mouth 2 (two) times daily.         Marland Kitchen. OVER THE COUNTER MEDICATION   Oral   Take 2 tablets by mouth daily. Jonette Eva*Antiiva* for prostate health         . oxyCODONE-acetaminophen (PERCOCET/ROXICET) 5-325 MG tablet   Oral   Take 1 tablet by mouth every 6 (six) hours as needed. For pain.         . potassium chloride SA (K-DUR,KLOR-CON) 20 MEQ tablet   Oral   Take 40 mEq by mouth 2 (two) times daily.         . predniSONE (DELTASONE) 10 MG tablet   Oral   Take 10 mg by mouth daily.         Marland Kitchen. warfarin (COUMADIN) 2 MG tablet   Oral   Take 6 mg by mouth daily.         . Zinc 50 MG TABS   Oral   Take 50 mg by  mouth every morning.             Allergies Review of patient's allergies indicates no known allergies.  No family history on file.  Social History Social History  Substance Use Topics  . Smoking status: Former Games developer  . Smokeless tobacco: None  . Alcohol Use: None    Review of Systems  Constitutional: Negative for fever. Eyes:  ENT: Negative for sore throat. Cardiovascular: Negative for chest pain. Respiratory: Negative for shortness of breath. Gastrointestinal: Negative for abdominal pain, vomiting and diarrhea. Genitourinary: Negative for dysuria. Musculoskeletal: Negative for back pain. Skin: Negative for rash. Neurological: Positive for headache. 10 point Review of Systems otherwise negative ____________________________________________   PHYSICAL EXAM:  VITAL SIGNS: ED Triage Vitals  Enc Vitals Group     BP 09/27/15 1744 157/89 mmHg     Pulse Rate 09/27/15 1744 94     Resp 09/27/15 1744 20     Temp 09/27/15 1744 98.1 F (36.7 C)     Temp Source 09/27/15 1744 Oral     SpO2 09/27/15 1744 97 %     Weight 09/27/15 1744 190 lb (86.183 kg)      Height 09/27/15 1744 5\' 9"  (1.753 m)     Head Cir --      Peak Flow --      Pain Score 09/27/15 1745 8     Pain Loc --      Pain Edu? --      Excl. in GC? --      Constitutional: Alert and orientedBut a bit of a poor historian. Well appearing and in no distress. HEENT   Head: Normocephalic and atraumatic.      Eyes: Conjunctiva slightly injected lateral left sclera.  PERRL. Normal extraocular movements.  Unable to visualize fundus with pinpoint pupils with light.  Intraocular pressure Right .  Left eye unable to appreciate light/dark or vision.  Right eye able to see light/dark as well as holding up fingers.      Ears:         Nose: No congestion/rhinnorhea.   Mouth/Throat: Mucous membranes are moist.   Neck: No stridor. Cardiovascular/Chest: Irregularly irregular, normal rate.  No murmurs, rubs, or gallops. Respiratory: Normal respiratory effort without tachypnea nor retractions. Breath sounds are clear and equal bilaterally. No wheezes/rales/rhonchi. Gastrointestinal: Soft. No distention, no guarding, no rebound. Nontender.    Genitourinary/rectal:Deferred Musculoskeletal: Nontender with normal range of motion in all extremities. No joint effusions.  No lower extremity tenderness.  No edema. Neurologic:  Normal speech and language. No gross or focal neurologic deficits are appreciated. Skin:  Skin is warm, dry and intact. No rash noted. Psychiatric: Mood and affect are normal. Speech and behavior are normal. Patient exhibits appropriate insight and judgment.  ____________________________________________   EKG I, Governor Rooks, MD, the attending physician have personally viewed and interpreted all ECGs.  85 bpm. Atrial fibrillation. Narrow QRS. Normal axis. Nonspecific T-wave ____________________________________________  LABS (pertinent positives/negatives)  Basic metabolic panel significant for potassium 3.2, BUN 27 and creatinine 1.66 CBC within normal limits  other than platelet 132 INR 3.36  ____________________________________________  RADIOLOGY All Xrays were viewed by me. Imaging interpreted by Radiologist.  CT without contrast:  IMPRESSION: No acute intracranial findings. 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 __________________________________________  PROCEDURES  Procedure(s) performed: None  Critical Care performed: CRITICAL CARE Performed by: Governor Rooks   Total critical care time: 30 minutes  Critical care time was exclusive of separately billable procedures and treating other patients.  Critical care was necessary to treat or prevent imminent or life-threatening deterioration.  Critical care was time spent personally by me on the following activities: development of treatment plan with patient and/or surrogate as well as nursing, discussions with consultants, evaluation of patient's response to treatment, examination of patient, obtaining history from patient or surrogate, ordering and performing treatments and interventions, ordering and review of laboratory studies, ordering and review of radiographic studies, pulse oximetry and re-evaluation of patient's condition.   ____________________________________________   ED COURSE / ASSESSMENT AND PLAN  Pertinent labs & imaging results that were available during my care of the patient were reviewed by me and considered in my medical decision making (see chart for details).   This patient arrived with decreased vision in the right I, ongoing for about 4 days now, and associated with some dizziness and intermittent headache for about 2 days as well.  If the symptoms are due to acute stroke, it seems like he is outside any window for tPA for bedtime standpoint. His history is somewhat inconsistent however, because at one point I asked him what time the headache and dizziness  starting said today, this morning, and at other times it sounds like he's had it intermittently over the past 2-4 days.  I was able to speak with Dr. Brooke Dare, ophthalmologist at [facility], who was able to confirm the patient's recent diagnosis of left eye CRAO, and potential concern for right CRAO, but also stroke with a headache and dizziness.  Dr. Brooke Dare recommended no additional emergency evaluation for vision changes, no additional emergency diagnoses or interventions from an ophthalmologic perspective.  I did check the intraocular pressure which was in the normal range.  Patient's head CT is negative for acute finding. I am going to admit him for stroke workup/rule out.  From the standpoint of risk factor for anticoagulation status, his INR is 3.36 today.  CONSULTATIONS: Phone consultation with ophthalmologist, Dr. Brooke Dare. Hospitalist for admission.   Patient / Family / Caregiver informed of clinical course, medical decision-making process, and agree with plan.     ___________________________________________   FINAL CLINICAL IMPRESSION(S) / ED DIAGNOSES   Final diagnoses:  Dim vision of right eye  Dizziness  Acute nonintractable headache, unspecified headache type              Note: This dictation was prepared with Dragon dictation. Any transcriptional errors that result from this process are unintentional   Governor Rooks, MD 09/27/15 2056

## 2015-09-27 NOTE — ED Notes (Signed)
Pt sent by MD Brooke DareKing at eye center.  Pt sts that he has been losing vision in R eye, sts that he has no vision in L eye.  Pt sts he lost vision in L eye 2-3 wks ago.  Pts son sts that pt has been running fever and having h/a for 2-3 days.

## 2015-09-27 NOTE — H&P (Signed)
[facility] [place] [facility]   PATIENT NAME: Luis Byrd    MR#:  [id number]  DATE OF BIRTH:  1941/04/11  DATE OF ADMISSION:  09/27/2015  PRIMARY CARE PHYSICIAN: Marguarite Arbour, MD   REQUESTING/REFERRING PHYSICIAN: Shaune Pollack, MD  CHIEF COMPLAINT:   Chief Complaint  Patient presents with  . Eye Problem    HISTORY OF PRESENT ILLNESS:  [name]  is a 75 y.o. male who presents with Progressing right eye vision loss. 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 Over the last couple of weeks he's been having progressive vision loss in his right eye. He describes this as loss of his right eye peripheral vision. Her last couple days he's also had some dizziness. He states he's been following with his ophthalmologist, though he has not had a full stroke workup. Hospitals were called for admission  PAST MEDICAL HISTORY:   Past Medical History  Diagnosis Date  . Hypertension   . Arthritis   . Chronic a-fib (HCC)   . CHF (congestive heart failure) (HCC)   . HLD (hyperlipidemia)   . BPH (benign prostatic hyperplasia)     PAST SURGICAL HISTORY:   Past Surgical History  Procedure Laterality Date  . Hernia repair    . Knee surgery      SOCIAL HISTORY:   Social History  Substance Use Topics  . Smoking status: Former Games developer  . Smokeless tobacco: Not on file  . Alcohol Use: No    FAMILY HISTORY:   Family History  Problem Relation Age of Onset  . Heart attack Father   . Cancer Mother     DRUG ALLERGIES:  No Known Allergies  MEDICATIONS AT HOME:   Prior to Admission medications   Medication Sig Start Date End Date Taking? Authorizing Provider  amLODipine (NORVASC) 10 MG tablet Take 10 mg by mouth 2 (two) times daily. 07/17/15  Yes Historical Provider, MD  DIGOX 125 MCG tablet Take 125 mcg by mouth daily. 07/17/15  Yes Historical Provider, MD  docusate sodium (COLACE) 50  MG capsule Take 150 mg by mouth at bedtime.    Yes Historical Provider, MD  Glucosamine-Chondroitin 250-200 MG TABS Take 2 tablets by mouth daily.   Yes Historical Provider, MD  hydrochlorothiazide (HYDRODIURIL) 25 MG tablet Take 25 mg by mouth 2 (two) times daily. 07/17/15  Yes Historical Provider, MD  OVER THE COUNTER MEDICATION Take 2 tablets by mouth daily. Jonette Eva* for prostate health   Yes Historical Provider, MD  oxyCODONE-acetaminophen (PERCOCET/ROXICET) 5-325 MG tablet Take 1 tablet by mouth every 6 (six) hours as needed. For pain. 09/24/15  Yes Historical Provider, MD  potassium chloride SA (K-DUR,KLOR-CON) 20 MEQ tablet Take 40 mEq by mouth 2 (two) times daily. 07/17/15  Yes Historical Provider, MD  predniSONE (DELTASONE) 10 MG tablet Take 10 mg by mouth daily. 07/17/15  Yes Historical Provider, MD  warfarin (COUMADIN) 2 MG tablet Take 6 mg by mouth daily.   Yes Historical Provider, MD  Zinc 50 MG TABS Take 50 mg by mouth every morning.   Yes Historical Provider, MD    REVIEW OF SYSTEMS:  Review of Systems  Constitutional: Negative for fever, chills, weight loss and malaise/fatigue.  HENT: Negative for ear pain, hearing loss and tinnitus.   Eyes: Positive for pain. Negative for blurred vision, double vision and redness.       Peripheral vision loss right eye  Respiratory:  Negative for cough, hemoptysis and shortness of breath.   Cardiovascular: Negative for chest pain, palpitations, orthopnea and leg swelling.  Gastrointestinal: Negative for nausea, vomiting, abdominal pain, diarrhea and constipation.  Genitourinary: Negative for dysuria, frequency and hematuria.  Musculoskeletal: Negative for back pain, joint pain and neck pain.  Skin:       No acne, rash, or lesions  Neurological: Negative for dizziness, tremors, focal weakness and weakness.  Endo/Heme/Allergies: Negative for polydipsia. Does not bruise/bleed easily.  Psychiatric/Behavioral: Negative for depression. The patient is  not nervous/anxious and does not have insomnia.      VITAL SIGNS:   Filed Vitals:   09/27/15 1900 09/27/15 1901 09/27/15 1930 09/27/15 2054  BP: 160/92 160/92 155/72 123/89  Pulse: 83 87 94 106  Temp:      TempSrc:      Resp:    21  Height:      Weight:      SpO2: 97% 99% 98% 99%   Wt Readings from Last 3 Encounters:  09/27/15 86.183 kg (190 lb)    PHYSICAL EXAMINATION:  Physical Exam  Vitals reviewed. Constitutional: He is oriented to person, place, and time. He appears well-developed and well-nourished. No distress.  HENT:  Head: Normocephalic and atraumatic.  Mouth/Throat: Oropharynx is clear and moist.  Eyes: Conjunctivae and EOM are normal. Pupils are equal, round, and reactive to light. No scleral icterus.  Neck: Normal range of motion. Neck supple. No JVD present. No thyromegaly present.  Cardiovascular: Normal rate, regular rhythm and intact distal pulses.  Exam reveals no gallop and no friction rub.   No murmur heard. Respiratory: Effort normal and breath sounds normal. No respiratory distress. He has no wheezes. He has no rales.  GI: Soft. Bowel sounds are normal. He exhibits no distension. There is no tenderness.  Musculoskeletal: Normal range of motion. He exhibits no edema.  No arthritis, no gout  Lymphadenopathy:    He has no cervical adenopathy.  Neurological: He is alert and oriented to person, place, and time. No cranial nerve deficit.  Neurologic: Cranial nerves II-XII intact, Sensation intact to light touch/pinprick, 5/5 strength in all extremities, no dysarthria, no aphasia, no dysphagia, memory intact, left eye total field cut, right eye lateral field cut upper and lower quadrants.  Skin: Skin is warm and dry. No rash noted. No erythema.  Psychiatric: He has a normal mood and affect. His behavior is normal. Judgment and thought content normal.    LABORATORY PANEL:   CBC  Recent Labs Lab 09/27/15 1858  WBC 5.7  HGB 14.5  HCT 43.6  PLT 142*    ------------------------------------------------------------------------------------------------------------------  Chemistries   Recent Labs Lab 09/27/15 1858  NA 140  K 3.2*  CL 100*  CO2 30  GLUCOSE 149*  BUN 27*  CREATININE 1.66*  CALCIUM 9.7   ------------------------------------------------------------------------------------------------------------------  Cardiac Enzymes No results for input(s): TROPONINI in the last 168 hours. ------------------------------------------------------------------------------------------------------------------  RADIOLOGY:  Ct Head Wo Contrast  09/27/2015  CLINICAL DATA:  Fever.  Loss of vision. EXAM: CT HEAD WITHOUT CONTRAST TECHNIQUE: Contiguous axial images were obtained from the base of the skull through the vertex without intravenous contrast. COMPARISON:  None. FINDINGS: There is no intracranial hemorrhage, mass or evidence of acute infarction. There is mild generalized atrophy. There is white matter hypodensity which likely chronic and due to small vessel disease. 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 IMPRESSION: No acute intracranial findings. 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 Electronically Signed   By: Ellery Plunk M.D.   On: 09/27/2015 18:43    EKG:   Orders placed or performed in visit on 01/20/04  . EKG 12-Lead    IMPRESSION AND PLAN:  Principal Problem:   Stroke [facility]) - Admit per stroke order set with MRI/MRA, corresponding lab checks including lipids and A1c, echocardiogram, neurology consult. Patient is already anticoagulated, see below. 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 Ophthalmology consult to evaluate right eye for any similar condition.   Chronic atrial fibrillation (HCC) - continue rate  controlling medications and anticoagulation   HTN (hypertension) - permissive hypertension first 24 hours blood pressure goal less than 220/120, then continue home meds   HLD (hyperlipidemia) - continue home meds, lipid check as above  All the records are reviewed and case discussed with ED provider. Management plans discussed with the patient and/or family.  DVT PROPHYLAXIS: Systemic anticoagulation  GI PROPHYLAXIS: None  ADMISSION STATUS: Inpatient  CODE STATUS: Full Code Status History    This patient does not have a recorded code status. Please follow your organizational policy for patients in this situation.      TOTAL TIME TAKING CARE OF THIS PATIENT: 45 minutes.    Antha Niday FIELDING 09/27/2015, 9:47 PM  [facility]  Office  [phone]  CC: Primary care physician; Marguarite Arbour, MD

## 2015-09-28 ENCOUNTER — Inpatient Hospital Stay
Admit: 2015-09-28 | Discharge: 2015-09-28 | Disposition: A | Discharge status: Home / Self Care | Payer: Medicare Other | Attending: Internal Medicine | Admitting: Internal Medicine

## 2015-09-28 ENCOUNTER — Inpatient Hospital Stay: Payer: Medicare Other

## 2015-09-28 DIAGNOSIS — H469 Unspecified optic neuritis: Secondary | ICD-10-CM

## 2015-09-28 LAB — POTASSIUM: Potassium: 3.9 mmol/L (ref 3.5–5.1)

## 2015-09-28 LAB — PROTIME-INR
INR: 3.18
PROTHROMBIN TIME: 32 s — AB (ref 11.4–15.0)

## 2015-09-28 LAB — LIPID PANEL
CHOL/HDL RATIO: 7.2 ratio
CHOLESTEROL: 186 mg/dL (ref 0–200)
HDL: 26 mg/dL — ABNORMAL LOW (ref >40)
LDL Cholesterol: 120 mg/dL — ABNORMAL HIGH (ref 0–99)
TRIGLYCERIDES: 201 mg/dL — AB (ref <150)
VLDL: 40 mg/dL (ref 0–40)

## 2015-09-28 LAB — BASIC METABOLIC PANEL
ANION GAP: 8 (ref 5–15)
BUN: 25 mg/dL — ABNORMAL HIGH (ref 6–20)
CALCIUM: 9.2 mg/dL (ref 8.9–10.3)
CO2: 32 mmol/L (ref 22–32)
Chloride: 98 mmol/L — ABNORMAL LOW (ref 101–111)
Creatinine, Ser: 1.42 mg/dL — ABNORMAL HIGH (ref 0.61–1.24)
GFR calc Af Amer: 54 mL/min — ABNORMAL LOW (ref >60)
GFR calc non Af Amer: 46 mL/min — ABNORMAL LOW (ref >60)
Glucose, Bld: 132 mg/dL — ABNORMAL HIGH (ref 65–99)
Potassium: 2.8 mmol/L — CL (ref 3.5–5.1)
Sodium: 138 mmol/L (ref 135–145)

## 2015-09-28 LAB — SEDIMENTATION RATE: Sed Rate: 6 mm/hr (ref 0–20)

## 2015-09-28 LAB — TSH: TSH: 0.331 u[IU]/mL — AB (ref 0.350–4.500)

## 2015-09-28 LAB — MAGNESIUM: Magnesium: 1.8 mg/dL (ref 1.7–2.4)

## 2015-09-28 LAB — HEMOGLOBIN A1C: Hgb A1c MFr Bld: 5.8 % (ref 4.0–6.0)

## 2015-09-28 LAB — GLUCOSE, CAPILLARY: Glucose-Capillary: 109 mg/dL — ABNORMAL HIGH (ref 65–99)

## 2015-09-28 MED ORDER — SENNOSIDES-DOCUSATE SODIUM 8.6-50 MG PO TABS
2.0000 | ORAL_TABLET | Freq: Two times a day (BID) | ORAL | Status: DC | PRN
  Administered 2015-09-28 – 2015-10-01 (×3): 2 via ORAL
  Filled 2015-09-28 (×3): qty 2

## 2015-09-28 MED ORDER — POTASSIUM CHLORIDE CRYS ER 20 MEQ PO TBCR
20.0000 meq | EXTENDED_RELEASE_TABLET | Freq: Once | ORAL | Status: AC
  Administered 2015-09-28: 20 meq via ORAL
  Filled 2015-09-28: qty 1

## 2015-09-28 MED ORDER — DIGOXIN 125 MCG PO TABS
0.1250 mg | ORAL_TABLET | Freq: Every day | ORAL | Status: DC
  Administered 2015-09-28 – 2015-10-01 (×4): 0.125 mg via ORAL
  Filled 2015-09-28 (×4): qty 1

## 2015-09-28 MED ORDER — POTASSIUM CHLORIDE 20 MEQ PO PACK
40.0000 meq | PACK | Freq: Once | ORAL | Status: AC
  Administered 2015-09-28: 10:00:00 40 meq via ORAL
  Filled 2015-09-28: qty 2

## 2015-09-28 MED ORDER — SODIUM CHLORIDE 0.9 % IV SOLN
250.0000 mg | Freq: Four times a day (QID) | INTRAVENOUS | Status: DC
  Administered 2015-09-28 – 2015-10-01 (×12): 250 mg via INTRAVENOUS
  Filled 2015-09-28 (×15): qty 2

## 2015-09-28 MED ORDER — PROMETHAZINE HCL 25 MG PO TABS
12.5000 mg | ORAL_TABLET | Freq: Four times a day (QID) | ORAL | Status: DC | PRN
  Administered 2015-09-28: 12.5 mg via ORAL
  Filled 2015-09-28: qty 1

## 2015-09-28 MED ORDER — POTASSIUM CHLORIDE 10 MEQ/100ML IV SOLN
10.0000 meq | INTRAVENOUS | Status: AC
  Administered 2015-09-28 (×4): 10 meq via INTRAVENOUS
  Filled 2015-09-28 (×4): qty 100

## 2015-09-28 MED ORDER — AMLODIPINE BESYLATE 10 MG PO TABS
10.0000 mg | ORAL_TABLET | Freq: Every day | ORAL | Status: DC
  Administered 2015-09-28 – 2015-10-01 (×4): 10 mg via ORAL
  Filled 2015-09-28 (×4): qty 1

## 2015-09-28 MED ORDER — HYDROCODONE-ACETAMINOPHEN 5-325 MG PO TABS
1.0000 | ORAL_TABLET | ORAL | Status: DC | PRN
  Administered 2015-09-28 (×4): 1 via ORAL
  Filled 2015-09-28 (×4): qty 1

## 2015-09-28 MED ORDER — WARFARIN - PHARMACIST DOSING INPATIENT
Freq: Every day | Status: DC
  Administered 2015-09-29 – 2015-09-30 (×2)

## 2015-09-28 MED ORDER — GADOBENATE DIMEGLUMINE 529 MG/ML IV SOLN
10.0000 mL | Freq: Once | INTRAVENOUS | Status: AC | PRN
  Administered 2015-09-28: 9 mL via INTRAVENOUS

## 2015-09-28 MED ORDER — MORPHINE SULFATE (PF) 2 MG/ML IV SOLN: 2.0000 mg | INTRAVENOUS | Status: DC | PRN

## 2015-09-28 NOTE — Progress Notes (Signed)
Dr. Amado CoeGouru made aware of potassium 2.8.  Dr. Amado CoeGouru to put in new orders.  Orson Apeanielle Darlyn Repsher, RN

## 2015-09-28 NOTE — Progress Notes (Signed)
*  PRELIMINARY RESULTS* Echocardiogram 2D Echocardiogram has been performed.  Luis Byrd 09/28/2015, 8:27 AM

## 2015-09-28 NOTE — Consult Note (Addendum)
Referring Physician: Gouru    Chief Complaint: Vision loss  HPI: Luis Byrd is an 75 y.o. male who presents with Progressing right eye vision loss. 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  Had an outpatient evaluation at that time that he reports included an echocardiogram and carotid doppler that were unremarkable.  Over the last couple of weeks he describes progressive vision loss in his right eye. He describes this as loss of his right eye peripheral vision. Her last couple days he's also had some dizziness. Initial NIHSS of 3.    Date last known well: Unable to determine Time last known well: Unable to determine tPA Given: No: Outside time window, On Coumadin with supratherapeutic INR  Past Medical History  Diagnosis Date  . Hypertension   . Arthritis   . Chronic a-fib ([place])   . CHF (congestive heart failure) ([place])   . HLD (hyperlipidemia)   . BPH (benign prostatic hyperplasia)     Past Surgical History  Procedure Laterality Date  . Hernia repair    . Knee surgery      Family History  Problem Relation Age of Onset  . Heart attack Father   . Cancer Mother    Social History:  reports that he has quit smoking. He does not have any smokeless tobacco history on file. He reports that he does not drink alcohol or use illicit drugs.  Allergies: No Known Allergies  Medications:  I have reviewed the patient's current medications. Prior to Admission:  Prescriptions prior to admission  Medication Sig Dispense Refill Last Dose  . amLODipine (NORVASC) 10 MG tablet Take 10 mg by mouth 2 (two) times daily.   09/27/2015 at Unknown time  . DIGOX 125 MCG tablet Take 125 mcg by mouth daily.   09/27/2015 at Unknown time  . docusate sodium (COLACE) 50 MG capsule Take 150 mg by mouth at bedtime.    09/26/2015 at Unknown time  . Glucosamine-Chondroitin 250-200 MG TABS Take 2 tablets by mouth daily.   09/27/2015 at  Unknown time  . hydrochlorothiazide (HYDRODIURIL) 25 MG tablet Take 25 mg by mouth 2 (two) times daily.   09/27/2015 at Unknown time  . OVER THE COUNTER MEDICATION Take 2 tablets by mouth daily. Joneen Caraway* for prostate health   09/27/2015 at Unknown time  . oxyCODONE-acetaminophen (PERCOCET/ROXICET) 5-325 MG tablet Take 1 tablet by mouth every 6 (six) hours as needed. For pain.   09/27/2015 at Unknown time  . potassium chloride SA (K-DUR,KLOR-CON) 20 MEQ tablet Take 40 mEq by mouth 2 (two) times daily.   09/27/2015 at Unknown time  . predniSONE (DELTASONE) 10 MG tablet Take 10 mg by mouth daily.   09/27/2015 at Unknown time  . warfarin (COUMADIN) 2 MG tablet Take 6 mg by mouth daily.   09/26/2015 at Unknown time  . Zinc 50 MG TABS Take 50 mg by mouth every morning.   09/27/2015 at Unknown time   Scheduled: . amLODipine  10 mg Oral Daily  . digoxin  0.125 mg Oral Daily  . potassium chloride  10 mEq Intravenous Q1 Hr x 4  . predniSONE  10 mg Oral Daily  . [START ON 09/29/2015] Warfarin - Pharmacist Dosing Inpatient   Does not apply q1800    ROS: History obtained from the patient  General ROS: negative for - chills, fatigue, fever, night sweats, weight gain or weight loss Psychological ROS: negative for -  behavioral disorder, hallucinations, memory difficulties, mood swings or suicidal ideation Ophthalmic ROS: negative for - blurry vision, double vision, eye pain or loss of vision ENT ROS: negative for - epistaxis, nasal discharge, oral lesions, sore throat, tinnitus or vertigo Allergy and Immunology ROS: negative for - hives or itchy/watery eyes Hematological and Lymphatic ROS: negative for - bleeding problems, bruising or swollen lymph nodes Endocrine ROS: negative for - galactorrhea, hair pattern changes, polydipsia/polyuria or temperature intolerance Respiratory ROS: negative for - cough, hemoptysis, shortness of breath or wheezing Cardiovascular ROS: negative for - chest pain, dyspnea on exertion, edema  or irregular heartbeat Gastrointestinal ROS: negative for - abdominal pain, diarrhea, hematemesis, nausea/vomiting or stool incontinence Genito-Urinary ROS: negative for - dysuria, hematuria, incontinence or urinary frequency/urgency Musculoskeletal ROS: negative for - joint swelling or muscular weakness Neurological ROS: as noted in HPI Dermatological ROS: negative for rash and skin lesion changes  Physical Examination: Blood pressure 140/78, pulse 72, temperature 98.2 F (36.8 C), temperature source Oral, resp. rate 20, height 5' 9"  (1.753 m), weight 86.183 kg (190 lb), SpO2 94 %.  HEENT-  Normocephalic, no lesions, without obvious abnormality.  Normal external eye and conjunctiva.  Normal TM's bilaterally.  Normal auditory canals and external ears. Normal external nose, mucus membranes and septum.  Normal pharynx. Cardiovascular- S1, S2 normal, pulses palpable throughout   Lungs- chest clear, no wheezing, rales, normal symmetric air entry Abdomen- soft, non-tender; bowel sounds normal; no masses,  no organomegaly Extremities- no edema Lymph-no adenopathy palpable Musculoskeletal-no joint tenderness, deformity or swelling Skin-warm and dry, no hyperpigmentation, vitiligo, or suspicious lesions  Neurological Examination Mental Status: Alert, oriented, thought content appropriate.  Speech fluent without evidence of aphasia.  Able to follow 3 step commands without difficulty. Cranial Nerves: II: Blind left eye.  No vision in temporal visual field of right eye.  20/50 vision in medial vision with glasses on.   III,IV, VI: ptosis not present, extra-ocular motions intact bilaterally V,VII: smile symmetric, facial light touch sensation normal bilaterally VIII: hearing normal bilaterally IX,X: gag reflex present XI: bilateral shoulder shrug XII: midline tongue extension Motor: Right : Upper extremity   5/5    Left:     Upper extremity   5/5  Lower extremity   5/5     Lower extremity    5/5 Tone and bulk:normal tone throughout; no atrophy noted Sensory: Pinprick and light touch intact throughout, bilaterally Deep Tendon Reflexes: 2+ in the upper extremities and mildly increased in the LLE as compared to the RLE Plantars: Right: downgoing   Left: downgoing Cerebellar: Normal finger-to-nose and normal heel-to-shin testing bilaterally Gait: not tested due to safety concerns   Laboratory Studies:  Basic Metabolic Panel:  Recent Labs Lab 09/27/15 1858 09/28/15 0922  NA 140 138  K 3.2* 2.8*  CL 100* 98*  CO2 30 32  GLUCOSE 149* 132*  BUN 27* 25*  CREATININE 1.66* 1.42*  CALCIUM 9.7 9.2    Liver Function Tests: No results for input(s): AST, ALT, ALKPHOS, BILITOT, PROT, ALBUMIN in the last 168 hours. No results for input(s): LIPASE, AMYLASE in the last 168 hours. No results for input(s): AMMONIA in the last 168 hours.  CBC:  Recent Labs Lab 09/27/15 1858  WBC 5.7  NEUTROABS 4.2  HGB 14.5  HCT 43.6  MCV 87.4  PLT 142*    Cardiac Enzymes: No results for input(s): CKTOTAL, CKMB, CKMBINDEX, TROPONINI in the last 168 hours.  BNP: Invalid input(s): POCBNP  CBG:  Recent Labs Lab 09/28/15  [place]    Microbiology: Results for orders placed or performed in visit on 01/02/13  Urine culture     Status: None   Collection Time: 01/02/13 10:14 AM  Result Value Ref Range Status   Micro Text Report   Final       SOURCE: [facility] CATCH    ORGANISM 1                1000 CFU/ML GRAM NEGATIVE ROD   ANTIBIOTIC                                                      MRSA PCR Screening     Status: None   Collection Time: 01/02/13 10:25 AM  Result Value Ref Range Status   Micro Text Report   Final       COMMENT                   NEGATIVE - MRSA target DNA not detected   ANTIBIOTIC                                                        Coagulation Studies:  Recent Labs  09/27/15 1858 09/28/15 0922  LABPROT 33.3* 32.0*  INR 3.36 3.18     Urinalysis: No results for input(s): COLORURINE, LABSPEC, PHURINE, GLUCOSEU, HGBUR, BILIRUBINUR, KETONESUR, PROTEINUR, UROBILINOGEN, NITRITE, LEUKOCYTESUR in the last 168 hours.  Invalid input(s): APPERANCEUR  Lipid Panel:    Component Value Date/Time   CHOL 186 09/28/2015 0608   TRIG 201* 09/28/2015 0608   HDL 26* 09/28/2015 0608   CHOLHDL 7.2 09/28/2015 0608   VLDL 40 09/28/2015 0608   LDLCALC 120* 09/28/2015 0608    HgbA1C: No results found for: HGBA1C  Urine Drug Screen:  No results found for: LABOPIA, COCAINSCRNUR, LABBENZ, AMPHETMU, THCU, LABBARB  Alcohol Level: No results for input(s): ETH in the last 168 hours.   Imaging: Ct Head Wo Contrast  09/27/2015  CLINICAL DATA:  Fever.  Loss of vision. EXAM: CT HEAD WITHOUT CONTRAST TECHNIQUE: Contiguous axial images were obtained from the base of the skull through the vertex without intravenous contrast. COMPARISON:  None. FINDINGS: There is no intracranial hemorrhage, mass or evidence of acute infarction. There is mild generalized atrophy. There is white matter hypodensity which likely chronic and due to small vessel disease. 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 IMPRESSION: No acute intracranial findings. 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 Electronically Signed   By: Andreas Newport M.D.   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 Right visual loss occurring over the past couple weeks. Subsequent encounter. EXAM: MR HEAD WITHOUT CONTRAST MR CIRCLE OF WILLIS WITHOUT CONTRAST MRA OF THE NECK WITHOUT AND WITH CONTRAST TECHNIQUE: Multiplanar, multiecho pulse sequences of the brain and surrounding structures were obtained according to standard protocol without  intravenous contrast.; Angiographic images of the Circle of Willis were obtained using MRA technique  without intravenous contrast.; Multiplanar and multiecho pulse sequences of the neck were obtained without and with intravenous contrast. Angiographic images of the neck were obtained using MRA technique without and with intravenous contast. CONTRAST:  9 cc MultiHance. Half dose contrast utilized secondary to GFR of 39. COMPARISON:  09/27/2015 CT.  No comparison MR. FINDINGS: MR HEAD FINDINGS Abnormal signal within the left optic nerve, optic chiasm bilaterally greater on the left and along the optic tracts bilaterally greater on the left. This includes restricted motion on diffusion imaging. Although ischemia has been clinically considered as a possible cause of visual loss, the distribution raises possibility of result of demyelinating process (neuromyelitis optica), inflammatory process, infectious process or vasculitis as a cause of the optic nerve/chiasm/ track abnormality. Tumor is a less likely consideration. The present brain MR was performed without contrast. Patient received contrast for MR angiogram of the neck. Patient has a low GFR of 39 and half dose contrast was utilized. If dedicated contrast enhanced orbital MR were to be obtained, exam should be delayed for at least 48 hours (to avoid multiple dosing of gadolinium in a patient with a low GFR secondary to possibility of developing nephrogenic systemic fibrosis) and patient's GFR should be rechecked prior to administration contrast (contact neuro radiologist if there is any question). Minimal exophthalmos. Symmetric normal appearance of the extra-ocular muscles. Mild to moderate nonspecific periventricular and subcortical white matter changes. Typically in a hypertensive patient of this age, these white matter changes are felt to be related to result of chronic microvascular changes. Tiny area of altered signal intensity deep dependent aspect of the  left lateral ventricle without fluid level may represent a tiny calcification rather intracranial hemorrhage. Overall no intracranial hemorrhage noted. 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 Global atrophy without hydrocephalus. Minimal mucosal thickening ethmoid sinus air cells. MR CIRCLE OF WILLIS FINDINGS Artifact extends through the vertebral arteries which are patent on MR angiogram of the neck. Nonvisualized right posterior inferior cerebellar artery and left anterior inferior cerebellar artery. No significant stenosis of the basilar artery. Mild irregularity and narrowing of portions of the superior cerebellar arteries and distal posterior cerebral artery branches bilaterally. Mild to moderate narrowing distal M1 segment left middle cerebral artery. Otherwise anterior circulation without medium or large size vessel significant stenosis or occlusion. Branch vessel irregularity. No aneurysm noted. MRA NECK FINDINGS Three vessel arch. Mild to moderate narrowing proximal right subclavian artery. Plaque right carotid bifurcation without measurable stenosis of the proximal right internal carotid artery. Plaque left carotid bifurcation with 34% diameter stenosis proximal left internal carotid artery. Codominant vertebral arteries without significant narrowing. IMPRESSION: MR HEAD Abnormal signal within the left optic nerve, optic chiasm bilaterally greater on the left and along the optic tracts bilaterally greater on the left. This includes restricted motion on diffusion imaging. Although ischemia has been clinically considered as a possible cause of visual loss, the distribution raises possibility of result of demyelinating process (neuromyelitis optica), inflammatory process, infectious process or vasculitis as a cause of the optic nerve/chiasm/ track abnormality. Tumor is a less likely consideration. Prior to considering contrast enhanced orbital MR,  please see above discussion. MR CIRCLE OF WILLIS Nonvisualized right posterior inferior cerebellar artery and left anterior inferior cerebellar artery. Mild irregularity and narrowing of portions of the superior cerebellar arteries and distal posterior cerebral artery branches bilaterally. Mild to moderate narrowing distal M1 segment left middle cerebral artery. Otherwise anterior circulation without medium or large size vessel significant  stenosis or occlusion. Branch vessel irregularity. MRA NECK Mild to moderate narrowing proximal right subclavian artery. Plaque right carotid bifurcation without measurable stenosis of the proximal right internal carotid artery. Plaque left carotid bifurcation with 34% diameter stenosis proximal left internal carotid artery. Codominant vertebral arteries without significant narrowing. These results will be called to the ordering clinician or representative by the Radiologist Assistant, and communication documented in the PACS or zVision Dashboard. Electronically Signed   By: Genia Del M.D.   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 Right visual loss occurring over the past couple weeks. Subsequent encounter. EXAM: MR HEAD WITHOUT CONTRAST MR CIRCLE OF WILLIS WITHOUT CONTRAST MRA OF THE NECK WITHOUT AND WITH CONTRAST TECHNIQUE: Multiplanar, multiecho pulse sequences of the brain and surrounding structures were obtained according to standard protocol without intravenous contrast.; Angiographic images of the Circle of Willis were obtained using MRA technique without intravenous contrast.; Multiplanar and multiecho pulse sequences of the neck were obtained without and with intravenous contrast. Angiographic images of the neck were obtained using MRA technique without and with intravenous contast. CONTRAST:  9 cc MultiHance. Half dose contrast  utilized secondary to GFR of 39. COMPARISON:  09/27/2015 CT.  No comparison MR. FINDINGS: MR HEAD FINDINGS Abnormal signal within the left optic nerve, optic chiasm bilaterally greater on the left and along the optic tracts bilaterally greater on the left. This includes restricted motion on diffusion imaging. Although ischemia has been clinically considered as a possible cause of visual loss, the distribution raises possibility of result of demyelinating process (neuromyelitis optica), inflammatory process, infectious process or vasculitis as a cause of the optic nerve/chiasm/ track abnormality. Tumor is a less likely consideration. The present brain MR was performed without contrast. Patient received contrast for MR angiogram of the neck. Patient has a low GFR of 39 and half dose contrast was utilized. If dedicated contrast enhanced orbital MR were to be obtained, exam should be delayed for at least 48 hours (to avoid multiple dosing of gadolinium in a patient with a low GFR secondary to possibility of developing nephrogenic systemic fibrosis) and patient's GFR should be rechecked prior to administration contrast (contact neuro radiologist if there is any question). Minimal exophthalmos. Symmetric normal appearance of the extra-ocular muscles. Mild to moderate nonspecific periventricular and subcortical white matter changes. Typically in a hypertensive patient of this age, these white matter changes are felt to be related to result of chronic microvascular changes. Tiny area of altered signal intensity deep dependent aspect of the left lateral ventricle without fluid level may represent a tiny calcification rather intracranial hemorrhage. Overall no intracranial hemorrhage noted. 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 Global atrophy without hydrocephalus. Minimal mucosal thickening ethmoid sinus air cells. MR CIRCLE OF WILLIS FINDINGS Artifact extends  through the vertebral arteries which are patent on MR angiogram of the neck. Nonvisualized right posterior inferior cerebellar artery and left anterior inferior cerebellar artery. No significant stenosis of the basilar artery. Mild irregularity and narrowing of portions of the superior cerebellar arteries and distal posterior cerebral artery branches bilaterally. Mild to moderate narrowing distal M1 segment left middle cerebral artery. Otherwise anterior circulation without medium or large size vessel significant stenosis or occlusion. Branch vessel irregularity. No aneurysm noted. MRA NECK FINDINGS Three vessel arch. Mild to moderate narrowing proximal right subclavian artery. Plaque right  carotid bifurcation without measurable stenosis of the proximal right internal carotid artery. Plaque left carotid bifurcation with 34% diameter stenosis proximal left internal carotid artery. Codominant vertebral arteries without significant narrowing. IMPRESSION: MR HEAD Abnormal signal within the left optic nerve, optic chiasm bilaterally greater on the left and along the optic tracts bilaterally greater on the left. This includes restricted motion on diffusion imaging. Although ischemia has been clinically considered as a possible cause of visual loss, the distribution raises possibility of result of demyelinating process (neuromyelitis optica), inflammatory process, infectious process or vasculitis as a cause of the optic nerve/chiasm/ track abnormality. Tumor is a less likely consideration. Prior to considering contrast enhanced orbital MR, please see above discussion. MR CIRCLE OF WILLIS Nonvisualized right posterior inferior cerebellar artery and left anterior inferior cerebellar artery. Mild irregularity and narrowing of portions of the superior cerebellar arteries and distal posterior cerebral artery branches bilaterally. Mild to moderate narrowing distal M1 segment left middle cerebral artery. Otherwise anterior  circulation without medium or large size vessel significant stenosis or occlusion. Branch vessel irregularity. MRA NECK Mild to moderate narrowing proximal right subclavian artery. Plaque right carotid bifurcation without measurable stenosis of the proximal right internal carotid artery. Plaque left carotid bifurcation with 34% diameter stenosis proximal left internal carotid artery. Codominant vertebral arteries without significant narrowing. These results will be called to the ordering clinician or representative by the Radiologist Assistant, and communication documented in the PACS or zVision Dashboard. Electronically Signed   By: Genia Del M.D.   On: 09/28/2015 12:46   Mr Luis Byrd Head/brain Wo Cm  09/28/2015  CLINICAL DATA:  75 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 Right visual loss occurring over the past couple weeks. Subsequent encounter. EXAM: MR HEAD WITHOUT CONTRAST MR CIRCLE OF WILLIS WITHOUT CONTRAST MRA OF THE NECK WITHOUT AND WITH CONTRAST TECHNIQUE: Multiplanar, multiecho pulse sequences of the brain and surrounding structures were obtained according to standard protocol without intravenous contrast.; Angiographic images of the Circle of Willis were obtained using MRA technique without intravenous contrast.; Multiplanar and multiecho pulse sequences of the neck were obtained without and with intravenous contrast. Angiographic images of the neck were obtained using MRA technique without and with intravenous contast. CONTRAST:  9 cc MultiHance. Half dose contrast utilized secondary to GFR of 39. COMPARISON:  09/27/2015 CT.  No comparison MR. FINDINGS: MR HEAD FINDINGS Abnormal signal within the left optic nerve, optic chiasm bilaterally greater on the left and along the optic tracts bilaterally greater on the left. This includes restricted motion on diffusion imaging. Although ischemia has been clinically considered as a possible cause  of visual loss, the distribution raises possibility of result of demyelinating process (neuromyelitis optica), inflammatory process, infectious process or vasculitis as a cause of the optic nerve/chiasm/ track abnormality. Tumor is a less likely consideration. The present brain MR was performed without contrast. Patient received contrast for MR angiogram of the neck. Patient has a low GFR of 39 and half dose contrast was utilized. If dedicated contrast enhanced orbital MR were to be obtained, exam should be delayed for at least 48 hours (to avoid multiple dosing of gadolinium in a patient with a low GFR secondary to possibility of developing nephrogenic systemic fibrosis) and patient's GFR should be rechecked prior to administration contrast (contact neuro radiologist if there is any question). Minimal exophthalmos. Symmetric normal appearance of the extra-ocular muscles. Mild to moderate nonspecific periventricular and subcortical white matter changes. Typically in a  hypertensive patient of this age, these white matter changes are felt to be related to result of chronic microvascular changes. Tiny area of altered signal intensity deep dependent aspect of the left lateral ventricle without fluid level may represent a tiny calcification rather intracranial hemorrhage. Overall no intracranial hemorrhage noted. 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 Global atrophy without hydrocephalus. Minimal mucosal thickening ethmoid sinus air cells. MR CIRCLE OF WILLIS FINDINGS Artifact extends through the vertebral arteries which are patent on MR angiogram of the neck. Nonvisualized right posterior inferior cerebellar artery and left anterior inferior cerebellar artery. No significant stenosis of the basilar artery. Mild irregularity and narrowing of portions of the superior cerebellar arteries and distal posterior cerebral artery branches bilaterally. Mild to moderate  narrowing distal M1 segment left middle cerebral artery. Otherwise anterior circulation without medium or large size vessel significant stenosis or occlusion. Branch vessel irregularity. No aneurysm noted. MRA NECK FINDINGS Three vessel arch. Mild to moderate narrowing proximal right subclavian artery. Plaque right carotid bifurcation without measurable stenosis of the proximal right internal carotid artery. Plaque left carotid bifurcation with 34% diameter stenosis proximal left internal carotid artery. Codominant vertebral arteries without significant narrowing. IMPRESSION: MR HEAD Abnormal signal within the left optic nerve, optic chiasm bilaterally greater on the left and along the optic tracts bilaterally greater on the left. This includes restricted motion on diffusion imaging. Although ischemia has been clinically considered as a possible cause of visual loss, the distribution raises possibility of result of demyelinating process (neuromyelitis optica), inflammatory process, infectious process or vasculitis as a cause of the optic nerve/chiasm/ track abnormality. Tumor is a less likely consideration. Prior to considering contrast enhanced orbital MR, please see above discussion. MR CIRCLE OF WILLIS Nonvisualized right posterior inferior cerebellar artery and left anterior inferior cerebellar artery. Mild irregularity and narrowing of portions of the superior cerebellar arteries and distal posterior cerebral artery branches bilaterally. Mild to moderate narrowing distal M1 segment left middle cerebral artery. Otherwise anterior circulation without medium or large size vessel significant stenosis or occlusion. Branch vessel irregularity. MRA NECK Mild to moderate narrowing proximal right subclavian artery. Plaque right carotid bifurcation without measurable stenosis of the proximal right internal carotid artery. Plaque left carotid bifurcation with 34% diameter stenosis proximal left internal carotid artery.  Codominant vertebral arteries without significant narrowing. These results will be called to the ordering clinician or representative by the Radiologist Assistant, and communication documented in the PACS or zVision Dashboard. Electronically Signed   By: Genia Del M.D.   On: 09/28/2015 12:46    Assessment: 75 y.o. male with vision loss over the past three weeks, first involving the left and now involving the right.  MRI of the brain personally reviewed and is concerning for an inflammatory process as opposed to an ischemic one.  Patient on Coumadin.  Would benefit from a trial of steroids at this point.  Stroke work up includes a carotid doppler that shows no hemodynamically significant stenosis.  Echocardiogram is pending.  A1c is 5.8.  LDL 120.    Stroke Risk Factors - atrial fibrillation, hyperlipidemia and hypertension  Plan: 1. Solumedrol 245m IV q 6 hours.  D/C prednisone. 2. GI prophylaxis 3. Lipid management 4. ESR, TSH 5. Obtain old records  LAlexis Goodell MD Neurology [phone]/11/2015, 1:49 PM

## 2015-09-28 NOTE — Evaluation (Signed)
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   Clinical Impression  Patient with recent L eye vision loss, now beginning to develop R eye peripheral vision deficits. Lateral superior and inferior fields are not present currently, though no other strength/sensory deficits found by this author in LEs/UEs. During ambulation he tends to drift to his left, likely a result of vision loss. No loss of balance though and patient appeared to be able to self correct with cuing to scan the environment by PT. Would recommend home safety/set up evaluation given his visual deficits, he does not appear to require assistive device currently.     Follow Up Recommendations Home health PT    Equipment Recommendations       Recommendations for Other Services       Precautions / Restrictions Precautions Precautions: Fall Restrictions Weight Bearing Restrictions: No      Mobility  Bed Mobility Overal bed mobility: Independent             General bed mobility comments: No deficits in bed mobility.   Transfers Overall transfer level: Needs assistance Equipment used: None Transfers: Sit to/from Stand Sit to Stand: Supervision         General transfer comment: No loss of balance or requirement for PT assistance noted.   Ambulation/Gait Ambulation/Gait assistance: Min guard Ambulation Distance (Feet): 360 Feet   Gait Pattern/deviations: Decreased step length - right;Decreased step length - left;Drifts right/left   Gait velocity interpretation: Below normal speed for age/gender General Gait Details: Patient does not show L side neglect, however he drifts to the  left during gait due to decreased visual input. No loss of balance with head turns, decreased stride length noted bilaterally.   Stairs            Wheelchair Mobility    Modified Rankin (Stroke Patients Only)       Balance Overall balance assessment: Needs assistance Sitting-balance support: No upper extremity supported Sitting balance-Leahy Scale: Good     Standing balance support: No upper extremity supported Standing balance-Leahy Scale: Fair Standing balance comment: Modified DGI of 11/12 without AD                              Pertinent Vitals/Pain Pain Assessment: No/denies pain    Home Living Family/patient expects to be discharged to:: Private residence Living Arrangements: Spouse/significant other Available Help at Discharge: Family (Son can drive to store) Type of Home: House Home Access: Stairs to enter Entrance Stairs-Rails: Left Entrance Stairs-Number of Steps: 3 Home Layout: One level Home Equipment: Walker - 2 wheels;Cane - single point      Prior Function Level of Independence: Independent         Comments: Patient reports no falls, no recent use of AD.      Hand Dominance        Extremity/Trunk Assessment   Upper Extremity Assessment: Overall WFL for tasks assessed           Lower Extremity Assessment: Overall WFL for tasks assessed (No deficits in strength/sensation noted)         Communication   Communication: No difficulties  Cognition Arousal/Alertness: Awake/alert Behavior During Therapy: WFL for tasks assessed/performed Overall Cognitive Status: Within Functional Limits for tasks assessed                      General Comments      Exercises        Assessment/Plan    PT Assessment Patient needs continued PT services  PT Diagnosis Difficulty walking   PT Problem List Decreased strength;Decreased safety awareness;Decreased balance;Decreased mobility;Other (comment) (Visual deficits leading  to mobility deficits)  PT Treatment Interventions DME instruction;Gait training;Stair training;Therapeutic activities;Therapeutic exercise;Balance training;Patient/family education   PT Goals (Current goals can be found in the Care Plan section) Acute Rehab PT Goals Patient Stated Goal: To return home  PT Goal Formulation: With patient/family Time For Goal Achievement: 10/12/15 Potential to Achieve Goals: Good    Frequency Min 2X/week   Barriers to discharge Decreased caregiver support 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     Co-evaluation               End of Session Equipment Utilized During Treatment: Gait belt Activity Tolerance: Patient tolerated treatment well Patient left: in chair;with call bell/phone within reach;with chair alarm set;with family/visitor present Nurse Communication: Mobility status         Time: 1001-1017 PT Time Calculation (min) (ACUTE ONLY): 16 min   Charges:   PT Evaluation $PT Eval Moderate Complexity: 1 Procedure     PT G Codes:       Kerin RansomPatrick A McNamara, PT, DPT    09/28/2015, 1:02 PM

## 2015-09-28 NOTE — Progress Notes (Addendum)
Made Dr. Amado CoeGouru aware that lab called second potassium of 2.9.  Potassium had to be slowed down due to pt c/o burning.  Pt still has 1 more potassium bag to infuse however potassium has barely increased.  One time order given by Dr. Amado CoeGouru for K-dur 20MEq once.  Orson Apeanielle Jhoselin Crume, RN

## 2015-09-28 NOTE — Progress Notes (Signed)
[facility] - [place] at [facility]   PATIENT NAME: Luis Byrd    MR#:  [id number]  DATE OF BIRTH:  03/16/1941  SUBJECTIVE:  CHIEF COMPLAINT:  Patient is reporting balance issues and loss of vision on the left side a few weeks ago and now decreased vision on the right side. Hard of hearing. Son at bedside.  REVIEW OF SYSTEMS:  CONSTITUTIONAL: No fever, fatigue or weakness.  EYES: Reporting loss of vision in the left side and now evolving on the right EARS, NOSE, AND THROAT: No tinnitus or ear pain.  RESPIRATORY: No cough, shortness of breath, wheezing or hemoptysis.  CARDIOVASCULAR: No chest pain, orthopnea, edema.  GASTROINTESTINAL: No nausea, vomiting, diarrhea or abdominal pain.  GENITOURINARY: No dysuria, hematuria.  ENDOCRINE: No polyuria, nocturia,  HEMATOLOGY: No anemia, easy bruising or bleeding SKIN: No rash or lesion. MUSCULOSKELETAL: No joint pain or arthritis.   NEUROLOGIC: No tingling, numbness, weakness.  PSYCHIATRY: No anxiety or depression.   DRUG ALLERGIES:  No Known Allergies  VITALS:  Blood pressure 140/78, pulse 72, temperature 98.2 F (36.8 C), temperature source Oral, resp. rate 20, height 5\' 9"  (1.753 m), weight 86.183 kg (190 lb), SpO2 94 %.  PHYSICAL EXAMINATION:  GENERAL:  75 y.o.-year-old patient lying in the bed with no acute distress.  EYES: Pupils equal, round, reactive to light and accommodation. No scleral icterus. Extraocular muscles intact.  HEENT: Head atraumatic, normocephalic. Oropharynx and nasopharynx clear.  NECK:  Supple, no jugular venous distention. No thyroid enlargement, no tenderness.  LUNGS: Normal breath sounds bilaterally, no wheezing, rales,rhonchi or crepitation. No use of accessory muscles of respiration.  CARDIOVASCULAR: S1, S2 normal. No murmurs, rubs, or gallops.  ABDOMEN: Soft, nontender, nondistended. Bowel sounds present. No organomegaly or mass.  EXTREMITIES: No pedal edema, cyanosis, or  clubbing.  NEUROLOGIC: Left eye is blind and decreased vision on the right  Muscle strength 5/5 in all extremities. Sensation intact. Gait not checked.  PSYCHIATRIC: The patient is alert and oriented x 3.  SKIN: No obvious rash, lesion, or ulcer.    LABORATORY PANEL:   CBC  Recent Labs Lab 09/27/15 1858  WBC 5.7  HGB 14.5  HCT 43.6  PLT 142*   ------------------------------------------------------------------------------------------------------------------  Chemistries   Recent Labs Lab 09/28/15 0922  NA 138  K 2.8*  CL 98*  CO2 32  GLUCOSE 132*  BUN 25*  CREATININE 1.42*  CALCIUM 9.2   ------------------------------------------------------------------------------------------------------------------  Cardiac Enzymes No results for input(s): TROPONINI in the last 168 hours. ------------------------------------------------------------------------------------------------------------------  RADIOLOGY:  Ct Head Wo Contrast  09/27/2015  CLINICAL DATA:  Fever.  Loss of vision. EXAM: CT HEAD WITHOUT CONTRAST TECHNIQUE: Contiguous axial images were obtained from the base of the skull through the vertex without intravenous contrast. COMPARISON:  None. FINDINGS: There is no intracranial hemorrhage, mass or evidence of acute infarction. There is mild generalized atrophy. There is white matter hypodensity which likely chronic and due to small vessel disease. 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 IMPRESSION: No acute intracranial findings. 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 Electronically Signed   By: Ellery Plunk M.D.   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 Right visual loss occurring over the past  couple weeks. Subsequent encounter. EXAM: MR HEAD WITHOUT CONTRAST  MR CIRCLE OF WILLIS WITHOUT CONTRAST MRA OF THE NECK WITHOUT AND WITH CONTRAST TECHNIQUE: Multiplanar, multiecho pulse sequences of the brain and surrounding structures were obtained according to standard protocol without intravenous contrast.; Angiographic images of the Circle of Willis were obtained using MRA technique without intravenous contrast.; Multiplanar and multiecho pulse sequences of the neck were obtained without and with intravenous contrast. Angiographic images of the neck were obtained using MRA technique without and with intravenous contast. CONTRAST:  9 cc MultiHance. Half dose contrast utilized secondary to GFR of 39. COMPARISON:  09/27/2015 CT.  No comparison MR. FINDINGS: MR HEAD FINDINGS Abnormal signal within the left optic nerve, optic chiasm bilaterally greater on the left and along the optic tracts bilaterally greater on the left. This includes restricted motion on diffusion imaging. Although ischemia has been clinically considered as a possible cause of visual loss, the distribution raises possibility of result of demyelinating process (neuromyelitis optica), inflammatory process, infectious process or vasculitis as a cause of the optic nerve/chiasm/ track abnormality. Tumor is a less likely consideration. The present brain MR was performed without contrast. Patient received contrast for MR angiogram of the neck. Patient has a low GFR of 39 and half dose contrast was utilized. If dedicated contrast enhanced orbital MR were to be obtained, exam should be delayed for at least 48 hours (to avoid multiple dosing of gadolinium in a patient with a low GFR secondary to possibility of developing nephrogenic systemic fibrosis) and patient's GFR should be rechecked prior to administration contrast (contact neuro radiologist if there is any question). Minimal exophthalmos. Symmetric normal appearance of the extra-ocular muscles. Mild  to moderate nonspecific periventricular and subcortical white matter changes. Typically in a hypertensive patient of this age, these white matter changes are felt to be related to result of chronic microvascular changes. Tiny area of altered signal intensity deep dependent aspect of the left lateral ventricle without fluid level may represent a tiny calcification rather intracranial hemorrhage. Overall no intracranial hemorrhage noted. 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 Global atrophy without hydrocephalus. Minimal mucosal thickening ethmoid sinus air cells. MR CIRCLE OF WILLIS FINDINGS Artifact extends through the vertebral arteries which are patent on MR angiogram of the neck. Nonvisualized right posterior inferior cerebellar artery and left anterior inferior cerebellar artery. No significant stenosis of the basilar artery. Mild irregularity and narrowing of portions of the superior cerebellar arteries and distal posterior cerebral artery branches bilaterally. Mild to moderate narrowing distal M1 segment left middle cerebral artery. Otherwise anterior circulation without medium or large size vessel significant stenosis or occlusion. Branch vessel irregularity. No aneurysm noted. MRA NECK FINDINGS Three vessel arch. Mild to moderate narrowing proximal right subclavian artery. Plaque right carotid bifurcation without measurable stenosis of the proximal right internal carotid artery. Plaque left carotid bifurcation with 34% diameter stenosis proximal left internal carotid artery. Codominant vertebral arteries without significant narrowing. IMPRESSION: MR HEAD Abnormal signal within the left optic nerve, optic chiasm bilaterally greater on the left and along the optic tracts bilaterally greater on the left. This includes restricted motion on diffusion imaging. Although ischemia has been clinically considered as a possible cause of visual loss, the  distribution raises possibility of result of demyelinating process (neuromyelitis optica), inflammatory process, infectious process or vasculitis as a cause of the optic nerve/chiasm/ track abnormality. Tumor is a less likely consideration. Prior to considering contrast enhanced orbital MR, please see above discussion. MR CIRCLE OF WILLIS Nonvisualized right posterior inferior cerebellar  artery and left anterior inferior cerebellar artery. Mild irregularity and narrowing of portions of the superior cerebellar arteries and distal posterior cerebral artery branches bilaterally. Mild to moderate narrowing distal M1 segment left middle cerebral artery. Otherwise anterior circulation without medium or large size vessel significant stenosis or occlusion. Branch vessel irregularity. MRA NECK Mild to moderate narrowing proximal right subclavian artery. Plaque right carotid bifurcation without measurable stenosis of the proximal right internal carotid artery. Plaque left carotid bifurcation with 34% diameter stenosis proximal left internal carotid artery. Codominant vertebral arteries without significant narrowing. These results will be called to the ordering clinician or representative by the Radiologist Assistant, and communication documented in the PACS or zVision Dashboard. Electronically Signed   By: Lacy Duverney M.D.   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 Right visual loss occurring over the past couple weeks. Subsequent encounter. EXAM: MR HEAD WITHOUT CONTRAST MR CIRCLE OF WILLIS WITHOUT CONTRAST MRA OF THE NECK WITHOUT AND WITH CONTRAST TECHNIQUE: Multiplanar, multiecho pulse sequences of the brain and surrounding structures were obtained according to standard protocol without intravenous contrast.; Angiographic images of the Circle of Willis were obtained using MRA  technique without intravenous contrast.; Multiplanar and multiecho pulse sequences of the neck were obtained without and with intravenous contrast. Angiographic images of the neck were obtained using MRA technique without and with intravenous contast. CONTRAST:  9 cc MultiHance. Half dose contrast utilized secondary to GFR of 39. COMPARISON:  09/27/2015 CT.  No comparison MR. FINDINGS: MR HEAD FINDINGS Abnormal signal within the left optic nerve, optic chiasm bilaterally greater on the left and along the optic tracts bilaterally greater on the left. This includes restricted motion on diffusion imaging. Although ischemia has been clinically considered as a possible cause of visual loss, the distribution raises possibility of result of demyelinating process (neuromyelitis optica), inflammatory process, infectious process or vasculitis as a cause of the optic nerve/chiasm/ track abnormality. Tumor is a less likely consideration. The present brain MR was performed without contrast. Patient received contrast for MR angiogram of the neck. Patient has a low GFR of 39 and half dose contrast was utilized. If dedicated contrast enhanced orbital MR were to be obtained, exam should be delayed for at least 48 hours (to avoid multiple dosing of gadolinium in a patient with a low GFR secondary to possibility of developing nephrogenic systemic fibrosis) and patient's GFR should be rechecked prior to administration contrast (contact neuro radiologist if there is any question). Minimal exophthalmos. Symmetric normal appearance of the extra-ocular muscles. Mild to moderate nonspecific periventricular and subcortical white matter changes. Typically in a hypertensive patient of this age, these white matter changes are felt to be related to result of chronic microvascular changes. Tiny area of altered signal intensity deep dependent aspect of the left lateral ventricle without fluid level may represent a tiny calcification rather  intracranial hemorrhage. Overall no intracranial hemorrhage noted. 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 Global atrophy without hydrocephalus. Minimal mucosal thickening ethmoid sinus air cells. MR CIRCLE OF WILLIS FINDINGS Artifact extends through the vertebral arteries which are patent on MR angiogram of the neck. Nonvisualized right posterior inferior cerebellar artery and left anterior inferior cerebellar artery. No significant stenosis of the basilar artery. Mild irregularity and narrowing of portions of the superior cerebellar arteries and distal posterior cerebral artery branches bilaterally.  Mild to moderate narrowing distal M1 segment left middle cerebral artery. Otherwise anterior circulation without medium or large size vessel significant stenosis or occlusion. Branch vessel irregularity. No aneurysm noted. MRA NECK FINDINGS Three vessel arch. Mild to moderate narrowing proximal right subclavian artery. Plaque right carotid bifurcation without measurable stenosis of the proximal right internal carotid artery. Plaque left carotid bifurcation with 34% diameter stenosis proximal left internal carotid artery. Codominant vertebral arteries without significant narrowing. IMPRESSION: MR HEAD Abnormal signal within the left optic nerve, optic chiasm bilaterally greater on the left and along the optic tracts bilaterally greater on the left. This includes restricted motion on diffusion imaging. Although ischemia has been clinically considered as a possible cause of visual loss, the distribution raises possibility of result of demyelinating process (neuromyelitis optica), inflammatory process, infectious process or vasculitis as a cause of the optic nerve/chiasm/ track abnormality. Tumor is a less likely consideration. Prior to considering contrast enhanced orbital MR, please see above discussion. MR CIRCLE OF WILLIS Nonvisualized right posterior inferior  cerebellar artery and left anterior inferior cerebellar artery. Mild irregularity and narrowing of portions of the superior cerebellar arteries and distal posterior cerebral artery branches bilaterally. Mild to moderate narrowing distal M1 segment left middle cerebral artery. Otherwise anterior circulation without medium or large size vessel significant stenosis or occlusion. Branch vessel irregularity. MRA NECK Mild to moderate narrowing proximal right subclavian artery. Plaque right carotid bifurcation without measurable stenosis of the proximal right internal carotid artery. Plaque left carotid bifurcation with 34% diameter stenosis proximal left internal carotid artery. Codominant vertebral arteries without significant narrowing. These results will be called to the ordering clinician or representative by the Radiologist Assistant, and communication documented in the PACS or zVision Dashboard. Electronically Signed   By: Lacy Duverney M.D.   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 Right visual loss occurring over the past couple weeks. Subsequent encounter. EXAM: MR HEAD WITHOUT CONTRAST MR CIRCLE OF WILLIS WITHOUT CONTRAST MRA OF THE NECK WITHOUT AND WITH CONTRAST TECHNIQUE: Multiplanar, multiecho pulse sequences of the brain and surrounding structures were obtained according to standard protocol without intravenous contrast.; Angiographic images of the Circle of Willis were obtained using MRA technique without intravenous contrast.; Multiplanar and multiecho pulse sequences of the neck were obtained without and with intravenous contrast. Angiographic images of the neck were obtained using MRA technique without and with intravenous contast. CONTRAST:  9 cc MultiHance. Half dose contrast utilized secondary to GFR of 39. COMPARISON:  09/27/2015 CT.  No comparison MR.  FINDINGS: MR HEAD FINDINGS Abnormal signal within the left optic nerve, optic chiasm bilaterally greater on the left and along the optic tracts bilaterally greater on the left. This includes restricted motion on diffusion imaging. Although ischemia has been clinically considered as a possible cause of visual loss, the distribution raises possibility of result of demyelinating process (neuromyelitis optica), inflammatory process, infectious process or vasculitis as a cause of the optic nerve/chiasm/ track abnormality. Tumor is a less likely consideration. The present brain MR was performed without contrast. Patient received contrast for MR angiogram of the neck. Patient has a low GFR of 39 and half dose contrast was utilized. If dedicated contrast enhanced orbital MR were to be obtained, exam should be delayed for at least 48 hours (to avoid multiple dosing of gadolinium in a patient with a low GFR secondary to  possibility of developing nephrogenic systemic fibrosis) and patient's GFR should be rechecked prior to administration contrast (contact neuro radiologist if there is any question). Minimal exophthalmos. Symmetric normal appearance of the extra-ocular muscles. Mild to moderate nonspecific periventricular and subcortical white matter changes. Typically in a hypertensive patient of this age, these white matter changes are felt to be related to result of chronic microvascular changes. Tiny area of altered signal intensity deep dependent aspect of the left lateral ventricle without fluid level may represent a tiny calcification rather intracranial hemorrhage. Overall no intracranial hemorrhage noted. 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 Global atrophy without hydrocephalus. Minimal mucosal thickening ethmoid sinus air cells. MR CIRCLE OF WILLIS FINDINGS Artifact extends through the vertebral arteries which are patent on MR angiogram of the neck.  Nonvisualized right posterior inferior cerebellar artery and left anterior inferior cerebellar artery. No significant stenosis of the basilar artery. Mild irregularity and narrowing of portions of the superior cerebellar arteries and distal posterior cerebral artery branches bilaterally. Mild to moderate narrowing distal M1 segment left middle cerebral artery. Otherwise anterior circulation without medium or large size vessel significant stenosis or occlusion. Branch vessel irregularity. No aneurysm noted. MRA NECK FINDINGS Three vessel arch. Mild to moderate narrowing proximal right subclavian artery. Plaque right carotid bifurcation without measurable stenosis of the proximal right internal carotid artery. Plaque left carotid bifurcation with 34% diameter stenosis proximal left internal carotid artery. Codominant vertebral arteries without significant narrowing. IMPRESSION: MR HEAD Abnormal signal within the left optic nerve, optic chiasm bilaterally greater on the left and along the optic tracts bilaterally greater on the left. This includes restricted motion on diffusion imaging. Although ischemia has been clinically considered as a possible cause of visual loss, the distribution raises possibility of result of demyelinating process (neuromyelitis optica), inflammatory process, infectious process or vasculitis as a cause of the optic nerve/chiasm/ track abnormality. Tumor is a less likely consideration. Prior to considering contrast enhanced orbital MR, please see above discussion. MR CIRCLE OF WILLIS Nonvisualized right posterior inferior cerebellar artery and left anterior inferior cerebellar artery. Mild irregularity and narrowing of portions of the superior cerebellar arteries and distal posterior cerebral artery branches bilaterally. Mild to moderate narrowing distal M1 segment left middle cerebral artery. Otherwise anterior circulation without medium or large size vessel significant stenosis or occlusion.  Branch vessel irregularity. MRA NECK Mild to moderate narrowing proximal right subclavian artery. Plaque right carotid bifurcation without measurable stenosis of the proximal right internal carotid artery. Plaque left carotid bifurcation with 34% diameter stenosis proximal left internal carotid artery. Codominant vertebral arteries without significant narrowing. These results will be called to the ordering clinician or representative by the Radiologist Assistant, and communication documented in the PACS or zVision Dashboard. Electronically Signed   By: Lacy Duverney M.D.   On: 09/28/2015 12:46    EKG:   Orders placed or performed in visit on 01/20/04  . 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 Ophthalmology consult to evaluate right eye for any similar condition.  #Chronic atrial fibrillation (HCC) - continue rate controlling medications and anticoagulation with Coumadin and INR at 3.18  # HTN (hypertension) - blood pressure is better today. Continue close monitoring. continue home meds   #HLD (hyperlipidemia) - continue home meds, LDL at 120     All the records are reviewed and case discussed with Care Management/Social Workerr. Management plans discussed with the patient, and his son at bedside. They both verbalized understanding of the plan    CODE STATUS: fc, son is the HCPOA  TOTAL TIME TAKING CARE OF THIS PATIENT: 35 minutes.   POSSIBLE D/C IN 2  DAYS, DEPENDING ON CLINICAL CONDITION.   Ramonita Lab M.D on 09/28/2015 at 2:51 PM  Between 7am to 6pm - Pager - [phone] After 6pm go to [email or web address] - password [email or web address] [facility]  [place] [place] [facility]  Office  [phone]  CC: Primary care physician; Marguarite Arbour, MD

## 2015-09-28 NOTE — Progress Notes (Signed)
ANTICOAGULATION CONSULT NOTE - Initial Consult  Pharmacy Consult for warfarin dosing Indication: atrial fibrillation  No Known Allergies  Patient Measurements: Height: 5\' 9"  (175.3 cm) Weight: 190 lb (86.183 kg) IBW/kg (Calculated) : 70.7 Heparin Dosing Weight: n/a  Vital Signs: Temp: 100.3 F (37.9 C) (05/07 0529) Temp Source: Oral (05/07 0529) BP: 133/72 mmHg (05/07 0529) Pulse Rate: 80 (05/07 0529)  Labs:  Recent Labs  09/27/15 1858  HGB 14.5  HCT 43.6  PLT 142*  LABPROT 33.3*  INR 3.36  CREATININE 1.66*    Estimated Creatinine Clearance: 41.2 mL/min (by C-G formula based on Cr of 1.66).   Medical History: Past Medical History  Diagnosis Date  . Hypertension   . Arthritis   . Chronic a-fib (HCC)   . CHF (congestive heart failure) (HCC)   . HLD (hyperlipidemia)   . BPH (benign prostatic hyperplasia)     Medications:    Assessment: INR 3.36  Goal of Therapy:  INR 2-3 Monitor platelets by anticoagulation protocol: Yes   Plan:  INR 3.36. No dose today. F/U INR in AM.  Yesli Vanderhoff S 09/28/2015,7:03 AM

## 2015-09-29 LAB — BASIC METABOLIC PANEL
ANION GAP: 12 (ref 5–15)
BUN: 28 mg/dL — AB (ref 6–20)
CALCIUM: 8.7 mg/dL — AB (ref 8.9–10.3)
CO2: 25 mmol/L (ref 22–32)
CREATININE: 1.25 mg/dL — AB (ref 0.61–1.24)
Chloride: 100 mmol/L — ABNORMAL LOW (ref 101–111)
GFR calc Af Amer: >60 mL/min (ref >60)
GFR calc non Af Amer: 54 mL/min — ABNORMAL LOW (ref >60)
Glucose, Bld: 166 mg/dL — ABNORMAL HIGH (ref 65–99)
POTASSIUM: 3.8 mmol/L (ref 3.5–5.1)
Sodium: 137 mmol/L (ref 135–145)

## 2015-09-29 LAB — MAGNESIUM: Magnesium: 1.8 mg/dL (ref 1.7–2.4)

## 2015-09-29 LAB — ECHOCARDIOGRAM COMPLETE
HEIGHTINCHES: 69 in
Weight: 3040 oz

## 2015-09-29 LAB — PROTIME-INR
INR: 1.99
Prothrombin Time: 22.5 seconds — ABNORMAL HIGH (ref 11.4–15.0)

## 2015-09-29 MED ORDER — WARFARIN SODIUM 2.5 MG PO TABS
5.0000 mg | ORAL_TABLET | Freq: Every day | ORAL | Status: DC
Start: 2015-09-29 — End: 2015-09-30
  Administered 2015-09-29: 17:00:00 5 mg via ORAL
  Filled 2015-09-29: qty 2

## 2015-09-29 NOTE — Progress Notes (Addendum)
Subjective: Patient with some improvement in vision in the right eye today per his report.  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  No evidence of CRAO in the right eye.  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   Objective: Current vital signs: BP 159/91 mmHg  Pulse 75  Temp(Src) 98.7 F (37.1 C) (Oral)  Resp 18  Ht 5\' 9"  (1.753 m)  Wt 86.183 kg (190 lb)  BMI 28.05 kg/m2  SpO2 95% Vital signs in last 24 hours: Temp:  [97.9 F (36.6 C)-98.7 F (37.1 C)] 98.7 F (37.1 C) (05/08 0833) Pulse Rate:  [68-91] 75 (05/08 0833) Resp:  [18-20] 18 (05/08 0833) BP: (127-159)/(64-91) 159/91 mmHg (05/08 0833) SpO2:  [90 %-97 %] 95 % (05/08 0833)  Intake/Output from previous day: 05/07 0701 - 05/08 0700 In: 1172 [P.O.:720; IV Piggyback:452] Out: 1325 [Urine:1325] Intake/Output this shift: Total I/O In: 240 [P.O.:240] Out: 150 [Urine:150] Nutritional status: Diet Heart Room service appropriate?: Yes; Fluid consistency:: Thin  Neurologic Exam: Mental Status: Alert, oriented, thought content appropriate. Speech fluent without evidence of aphasia. Able to follow 3 step commands without difficulty. Cranial Nerves: II: Blind left eye. Some ability to see objects in the inferior portion of the temporal visual field in the right eye.    III,IV, VI: ptosis not present, extra-ocular motions intact bilaterally V,VII: smile symmetric, facial light touch sensation normal bilaterally VIII: hearing normal bilaterally IX,X: gag reflex present XI: bilateral shoulder shrug XII: midline tongue extension Motor: Right :Upper extremity 5/5Left: Upper extremity 5/5 Lower extremity 5/5Lower extremity 5/5   Lab  Results: Basic Metabolic Panel:  Recent Labs Lab 09/27/15 1858 09/28/15 0922 09/28/15 1446 09/28/15 1816 09/29/15 0420  NA 140 138  --   --  137  K 3.2* 2.8*  --  3.9 3.8  CL 100* 98*  --   --  100*  CO2 30 32  --   --  25  GLUCOSE 149* 132*  --   --  166*  BUN 27* 25*  --   --  28*  CREATININE 1.66* 1.42*  --   --  1.25*  CALCIUM 9.7 9.2  --   --  8.7*  MG  --   --  1.8  --  1.8    Liver Function Tests: No results for input(s): AST, ALT, ALKPHOS, BILITOT, PROT, ALBUMIN in the last 168 hours. No results for input(s): LIPASE, AMYLASE in the last 168 hours. No results for input(s): AMMONIA in the last 168 hours.  CBC:  Recent Labs Lab 09/27/15 1858  WBC 5.7  NEUTROABS 4.2  HGB 14.5  HCT 43.6  MCV 87.4  PLT 142*    Cardiac Enzymes: No results for input(s): CKTOTAL, CKMB, CKMBINDEX, TROPONINI in the last 168 hours.  Lipid Panel:  Recent Labs Lab 09/28/15 0608  CHOL 186  TRIG 201*  HDL 26*  CHOLHDL 7.2  VLDL 40  LDLCALC 161*    CBG:  Recent Labs Lab 09/28/15 0745  GLUCAP 109*    Microbiology: Results for orders placed or performed in visit on 01/02/13  Urine culture     Status: None   Collection Time: 01/02/13 10:14 AM  Result Value Ref Range Status   Micro Text Report   Final       SOURCE: CLE[facility] CATCH    ORGANISM 1  1000 CFU/ML GRAM NEGATIVE ROD   ANTIBIOTIC                                                      MRSA PCR Screening     Status: None   Collection Time: 01/02/13 10:25 AM  Result Value Ref Range Status   Micro Text Report   Final       COMMENT                   NEGATIVE - MRSA target DNA not detected   ANTIBIOTIC                                                        Coagulation Studies:  Recent Labs  09/27/15 1858 09/28/15 0922 09/29/15 0420  LABPROT 33.3* 32.0* 22.5*  INR 3.36 3.18 1.99    Imaging: Ct Head Wo Contrast  09/27/2015  CLINICAL DATA:  Fever.  Loss of vision. EXAM: CT HEAD WITHOUT  CONTRAST TECHNIQUE: Contiguous axial images were obtained from the base of the skull through the vertex without intravenous contrast. COMPARISON:  None. FINDINGS: There is no intracranial hemorrhage, mass or evidence of acute infarction. There is mild generalized atrophy. There is white matter hypodensity which likely chronic and due to small vessel disease. 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 IMPRESSION: No acute intracranial findings. 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 Electronically Signed   By: Ellery Plunk M.D.   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 Right visual loss occurring over the past couple weeks. Subsequent encounter. EXAM: MR HEAD WITHOUT CONTRAST MR CIRCLE OF WILLIS WITHOUT CONTRAST MRA OF THE NECK WITHOUT AND WITH CONTRAST TECHNIQUE: Multiplanar, multiecho pulse sequences of the brain and surrounding structures were obtained according to standard protocol without intravenous contrast.; Angiographic images of the Circle of Willis were obtained using MRA technique without intravenous contrast.; Multiplanar and multiecho pulse sequences of the neck were obtained without and with intravenous contrast. Angiographic images of the neck were obtained using MRA technique without and with intravenous contast. CONTRAST:  9 cc MultiHance. Half dose contrast utilized secondary to GFR of 39. COMPARISON:  09/27/2015 CT.  No comparison MR. FINDINGS: MR HEAD FINDINGS Abnormal signal within the left optic nerve, optic chiasm bilaterally greater on the left and along the optic tracts bilaterally greater on the left. This includes restricted motion on diffusion imaging. Although ischemia has been clinically considered as a possible  cause of visual loss, the distribution raises possibility of result of demyelinating process (neuromyelitis optica), inflammatory process, infectious process or vasculitis as a cause of the optic nerve/chiasm/ track abnormality. Tumor is a less likely consideration. The present brain MR was performed without contrast. Patient received contrast for MR angiogram of the neck. Patient has a low GFR of 39 and half dose contrast was utilized. If dedicated contrast enhanced orbital MR were to be obtained, exam should be delayed for at least  48 hours (to avoid multiple dosing of gadolinium in a patient with a low GFR secondary to possibility of developing nephrogenic systemic fibrosis) and patient's GFR should be rechecked prior to administration contrast (contact neuro radiologist if there is any question). Minimal exophthalmos. Symmetric normal appearance of the extra-ocular muscles. Mild to moderate nonspecific periventricular and subcortical white matter changes. Typically in a hypertensive patient of this age, these white matter changes are felt to be related to result of chronic microvascular changes. Tiny area of altered signal intensity deep dependent aspect of the left lateral ventricle without fluid level may represent a tiny calcification rather intracranial hemorrhage. Overall no intracranial hemorrhage noted. 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 Global atrophy without hydrocephalus. Minimal mucosal thickening ethmoid sinus air cells. MR CIRCLE OF WILLIS FINDINGS Artifact extends through the vertebral arteries which are patent on MR angiogram of the neck. Nonvisualized right posterior inferior cerebellar artery and left anterior inferior cerebellar artery. No significant stenosis of the basilar artery. Mild irregularity and narrowing of portions of the superior cerebellar arteries and distal posterior cerebral artery branches bilaterally. Mild to  moderate narrowing distal M1 segment left middle cerebral artery. Otherwise anterior circulation without medium or large size vessel significant stenosis or occlusion. Branch vessel irregularity. No aneurysm noted. MRA NECK FINDINGS Three vessel arch. Mild to moderate narrowing proximal right subclavian artery. Plaque right carotid bifurcation without measurable stenosis of the proximal right internal carotid artery. Plaque left carotid bifurcation with 34% diameter stenosis proximal left internal carotid artery. Codominant vertebral arteries without significant narrowing. IMPRESSION: MR HEAD Abnormal signal within the left optic nerve, optic chiasm bilaterally greater on the left and along the optic tracts bilaterally greater on the left. This includes restricted motion on diffusion imaging. Although ischemia has been clinically considered as a possible cause of visual loss, the distribution raises possibility of result of demyelinating process (neuromyelitis optica), inflammatory process, infectious process or vasculitis as a cause of the optic nerve/chiasm/ track abnormality. Tumor is a less likely consideration. Prior to considering contrast enhanced orbital MR, please see above discussion. MR CIRCLE OF WILLIS Nonvisualized right posterior inferior cerebellar artery and left anterior inferior cerebellar artery. Mild irregularity and narrowing of portions of the superior cerebellar arteries and distal posterior cerebral artery branches bilaterally. Mild to moderate narrowing distal M1 segment left middle cerebral artery. Otherwise anterior circulation without medium or large size vessel significant stenosis or occlusion. Branch vessel irregularity. MRA NECK Mild to moderate narrowing proximal right subclavian artery. Plaque right carotid bifurcation without measurable stenosis of the proximal right internal carotid artery. Plaque left carotid bifurcation with 34% diameter stenosis proximal left internal carotid  artery. Codominant vertebral arteries without significant narrowing. These results will be called to the ordering clinician or representative by the Radiologist Assistant, and communication documented in the PACS or zVision Dashboard. Electronically Signed   By: Lacy DuverneySteven  Olson M.D.   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 Right visual loss occurring over the past couple weeks. Subsequent encounter. EXAM: MR HEAD WITHOUT CONTRAST MR CIRCLE OF WILLIS WITHOUT CONTRAST MRA OF THE NECK WITHOUT AND WITH CONTRAST TECHNIQUE: Multiplanar, multiecho pulse sequences of the brain and surrounding structures were obtained according to standard protocol without intravenous contrast.; Angiographic images of the Circle of Willis were obtained using MRA technique without intravenous contrast.; Multiplanar and multiecho  pulse sequences of the neck were obtained without and with intravenous contrast. Angiographic images of the neck were obtained using MRA technique without and with intravenous contast. CONTRAST:  9 cc MultiHance. Half dose contrast utilized secondary to GFR of 39. COMPARISON:  09/27/2015 CT.  No comparison MR. FINDINGS: MR HEAD FINDINGS Abnormal signal within the left optic nerve, optic chiasm bilaterally greater on the left and along the optic tracts bilaterally greater on the left. This includes restricted motion on diffusion imaging. Although ischemia has been clinically considered as a possible cause of visual loss, the distribution raises possibility of result of demyelinating process (neuromyelitis optica), inflammatory process, infectious process or vasculitis as a cause of the optic nerve/chiasm/ track abnormality. Tumor is a less likely consideration. The present brain MR was performed without contrast. Patient received contrast for MR angiogram of the neck. Patient has  a low GFR of 39 and half dose contrast was utilized. If dedicated contrast enhanced orbital MR were to be obtained, exam should be delayed for at least 48 hours (to avoid multiple dosing of gadolinium in a patient with a low GFR secondary to possibility of developing nephrogenic systemic fibrosis) and patient's GFR should be rechecked prior to administration contrast (contact neuro radiologist if there is any question). Minimal exophthalmos. Symmetric normal appearance of the extra-ocular muscles. Mild to moderate nonspecific periventricular and subcortical white matter changes. Typically in a hypertensive patient of this age, these white matter changes are felt to be related to result of chronic microvascular changes. Tiny area of altered signal intensity deep dependent aspect of the left lateral ventricle without fluid level may represent a tiny calcification rather intracranial hemorrhage. Overall no intracranial hemorrhage noted. 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 Global atrophy without hydrocephalus. Minimal mucosal thickening ethmoid sinus air cells. MR CIRCLE OF WILLIS FINDINGS Artifact extends through the vertebral arteries which are patent on MR angiogram of the neck. Nonvisualized right posterior inferior cerebellar artery and left anterior inferior cerebellar artery. No significant stenosis of the basilar artery. Mild irregularity and narrowing of portions of the superior cerebellar arteries and distal posterior cerebral artery branches bilaterally. Mild to moderate narrowing distal M1 segment left middle cerebral artery. Otherwise anterior circulation without medium or large size vessel significant stenosis or occlusion. Branch vessel irregularity. No aneurysm noted. MRA NECK FINDINGS Three vessel arch. Mild to moderate narrowing proximal right subclavian artery. Plaque right carotid bifurcation without measurable stenosis of the proximal  right internal carotid artery. Plaque left carotid bifurcation with 34% diameter stenosis proximal left internal carotid artery. Codominant vertebral arteries without significant narrowing. IMPRESSION: MR HEAD Abnormal signal within the left optic nerve, optic chiasm bilaterally greater on the left and along the optic tracts bilaterally greater on the left. This includes restricted motion on diffusion imaging. Although ischemia has been clinically considered as a possible cause of visual loss, the distribution raises possibility of result of demyelinating process (neuromyelitis optica), inflammatory process, infectious process or vasculitis as a cause of the optic nerve/chiasm/ track abnormality. Tumor is a less likely consideration. Prior to considering contrast enhanced orbital MR, please see above discussion. MR CIRCLE OF WILLIS Nonvisualized right posterior inferior cerebellar artery and left anterior inferior cerebellar artery. Mild irregularity and narrowing of portions of the superior cerebellar arteries and distal posterior cerebral artery branches bilaterally. Mild to moderate narrowing distal M1 segment left middle cerebral artery. Otherwise anterior circulation without medium or large size vessel significant stenosis or occlusion. Branch vessel irregularity.  MRA NECK Mild to moderate narrowing proximal right subclavian artery. Plaque right carotid bifurcation without measurable stenosis of the proximal right internal carotid artery. Plaque left carotid bifurcation with 34% diameter stenosis proximal left internal carotid artery. Codominant vertebral arteries without significant narrowing. These results will be called to the ordering clinician or representative by the Radiologist Assistant, and communication documented in the PACS or zVision Dashboard. Electronically Signed   By: Lacy Duverney M.D.   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 Right visual loss occurring over the past couple weeks. Subsequent encounter. EXAM: MR HEAD WITHOUT CONTRAST MR CIRCLE OF WILLIS WITHOUT CONTRAST MRA OF THE NECK WITHOUT AND WITH CONTRAST TECHNIQUE: Multiplanar, multiecho pulse sequences of the brain and surrounding structures were obtained according to standard protocol without intravenous contrast.; Angiographic images of the Circle of Willis were obtained using MRA technique without intravenous contrast.; Multiplanar and multiecho pulse sequences of the neck were obtained without and with intravenous contrast. Angiographic images of the neck were obtained using MRA technique without and with intravenous contast. CONTRAST:  9 cc MultiHance. Half dose contrast utilized secondary to GFR of 39. COMPARISON:  09/27/2015 CT.  No comparison MR. FINDINGS: MR HEAD FINDINGS Abnormal signal within the left optic nerve, optic chiasm bilaterally greater on the left and along the optic tracts bilaterally greater on the left. This includes restricted motion on diffusion imaging. Although ischemia has been clinically considered as a possible cause of visual loss, the distribution raises possibility of result of demyelinating process (neuromyelitis optica), inflammatory process, infectious process or vasculitis as a cause of the optic nerve/chiasm/ track abnormality. Tumor is a less likely consideration. The present brain MR was performed without contrast. Patient received contrast for MR angiogram of the neck. Patient has a low GFR of 39 and half dose contrast was utilized. If dedicated contrast enhanced orbital MR were to be obtained, exam should be delayed for at least 48 hours (to avoid multiple dosing of gadolinium in a patient with a low GFR secondary to possibility of developing nephrogenic systemic fibrosis) and patient's GFR should be rechecked prior to administration contrast (contact neuro  radiologist if there is any question). Minimal exophthalmos. Symmetric normal appearance of the extra-ocular muscles. Mild to moderate nonspecific periventricular and subcortical white matter changes. Typically in a hypertensive patient of this age, these white matter changes are felt to be related to result of chronic microvascular changes. Tiny area of altered signal intensity deep dependent aspect of the left lateral ventricle without fluid level may represent a tiny calcification rather intracranial hemorrhage. Overall no intracranial hemorrhage noted. 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 Global atrophy without hydrocephalus. Minimal mucosal thickening ethmoid sinus air cells. MR CIRCLE OF WILLIS FINDINGS Artifact extends through the vertebral arteries which are patent on MR angiogram of the neck. Nonvisualized right posterior inferior cerebellar artery and left anterior inferior cerebellar artery. No significant stenosis of the basilar artery. Mild irregularity and narrowing of portions of the superior cerebellar arteries and distal posterior cerebral artery branches bilaterally. Mild to moderate narrowing distal M1 segment left middle cerebral artery. Otherwise anterior circulation without medium or large size vessel significant stenosis or occlusion. Branch vessel irregularity. No aneurysm noted. MRA NECK FINDINGS Three vessel arch. Mild to moderate narrowing proximal right subclavian artery. Plaque right carotid bifurcation without measurable stenosis  of the proximal right internal carotid artery. Plaque left carotid bifurcation with 34% diameter stenosis proximal left internal carotid artery. Codominant vertebral arteries without significant narrowing. IMPRESSION: MR HEAD Abnormal signal within the left optic nerve, optic chiasm bilaterally greater on the left and along the optic tracts bilaterally greater on the left. This includes restricted  motion on diffusion imaging. Although ischemia has been clinically considered as a possible cause of visual loss, the distribution raises possibility of result of demyelinating process (neuromyelitis optica), inflammatory process, infectious process or vasculitis as a cause of the optic nerve/chiasm/ track abnormality. Tumor is a less likely consideration. Prior to considering contrast enhanced orbital MR, please see above discussion. MR CIRCLE OF WILLIS Nonvisualized right posterior inferior cerebellar artery and left anterior inferior cerebellar artery. Mild irregularity and narrowing of portions of the superior cerebellar arteries and distal posterior cerebral artery branches bilaterally. Mild to moderate narrowing distal M1 segment left middle cerebral artery. Otherwise anterior circulation without medium or large size vessel significant stenosis or occlusion. Branch vessel irregularity. MRA NECK Mild to moderate narrowing proximal right subclavian artery. Plaque right carotid bifurcation without measurable stenosis of the proximal right internal carotid artery. Plaque left carotid bifurcation with 34% diameter stenosis proximal left internal carotid artery. Codominant vertebral arteries without significant narrowing. These results will be called to the ordering clinician or representative by the Radiologist Assistant, and communication documented in the PACS or zVision Dashboard. Electronically Signed   By: Lacy Duverney M.D.   On: 09/28/2015 12:46    Medications:  I have reviewed the patient's current medications. Scheduled: . amLODipine  10 mg Oral Daily  . digoxin  0.125 mg Oral Daily  . methylPREDNISolone (SOLU-MEDROL) injection  250 mg Intravenous Q6H  . warfarin  5 mg Oral q1800  . Warfarin - Pharmacist Dosing Inpatient   Does not apply q1800    Assessment/Plan: Patient reports some improvement in vision.  Tolerating steroids.    Recommendations: 1.  Continue steroids for 3 days-12 doses.      LOS: 2 days   Thana Farr, MD Neurology [phone] 09/29/2015  10:23 AM

## 2015-09-29 NOTE — Progress Notes (Signed)
[facility] - [place] at [facility]   PATIENT NAME: Luis Byrd    MR#:  [id number]  DATE OF BIRTH:  August 09, 1940  SUBJECTIVE:  CHIEF COMPLAINT:  Patient is Resting comfortably, no overnight events , reporting loss of vision on the left side a few weeks ago and now decreased vision on the right side. Hard of hearing.   REVIEW OF SYSTEMS:  CONSTITUTIONAL: No fever, fatigue or weakness.  EYES: Reporting loss of vision in the left side and now evolving on the right EARS, NOSE, AND THROAT: No tinnitus or ear pain.  RESPIRATORY: No cough, shortness of breath, wheezing or hemoptysis.  CARDIOVASCULAR: No chest pain, orthopnea, edema.  GASTROINTESTINAL: No nausea, vomiting, diarrhea or abdominal pain.  GENITOURINARY: No dysuria, hematuria.  ENDOCRINE: No polyuria, nocturia,  HEMATOLOGY: No anemia, easy bruising or bleeding SKIN: No rash or lesion. MUSCULOSKELETAL: No joint pain or arthritis.   NEUROLOGIC: No tingling, numbness, weakness.  PSYCHIATRY: No anxiety or depression.   DRUG ALLERGIES:  No Known Allergies  VITALS:  Blood pressure 124/78, pulse 96, temperature 97.8 F (36.6 C), temperature source Oral, resp. rate 20, height 5\' 9"  (1.753 m), weight 86.183 kg (190 lb), SpO2 98 %.  PHYSICAL EXAMINATION:  GENERAL:  75 y.o.-year-old patient lying in the bed with no acute distress.  EYES: Pupils equal, round, reactive to light and accommodation. No scleral icterus. Extraocular muscles intact.  HEENT: Head atraumatic, normocephalic. Oropharynx and nasopharynx clear.  NECK:  Supple, no jugular venous distention. No thyroid enlargement, no tenderness.  LUNGS: Normal breath sounds bilaterally, no wheezing, rales,rhonchi or crepitation. No use of accessory muscles of respiration.  CARDIOVASCULAR: S1, S2 normal. No murmurs, rubs, or gallops.  ABDOMEN: Soft, nontender, nondistended. Bowel sounds present. No organomegaly or mass.  EXTREMITIES: No pedal edema, cyanosis,  or clubbing.  NEUROLOGIC: Left eye is blind and decreased vision on the right  Muscle strength 5/5 in all extremities. Sensation intact. Gait not checked.  PSYCHIATRIC: The patient is alert and oriented x 3.  SKIN: No obvious rash, lesion, or ulcer.    LABORATORY PANEL:   CBC  Recent Labs Lab 09/27/15 1858  WBC 5.7  HGB 14.5  HCT 43.6  PLT 142*   ------------------------------------------------------------------------------------------------------------------  Chemistries   Recent Labs Lab 09/29/15 0420  NA 137  K 3.8  CL 100*  CO2 25  GLUCOSE 166*  BUN 28*  CREATININE 1.25*  CALCIUM 8.7*  MG 1.8   ------------------------------------------------------------------------------------------------------------------  Cardiac Enzymes No results for input(s): TROPONINI in the last 168 hours. ------------------------------------------------------------------------------------------------------------------  RADIOLOGY:  Ct Head Wo Contrast  09/27/2015  CLINICAL DATA:  Fever.  Loss of vision. EXAM: CT HEAD WITHOUT CONTRAST TECHNIQUE: Contiguous axial images were obtained from the base of the skull through the vertex without intravenous contrast. COMPARISON:  None. FINDINGS: There is no intracranial hemorrhage, mass or evidence of acute infarction. There is mild generalized atrophy. There is white matter hypodensity which likely chronic and due to small vessel disease. 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 IMPRESSION: No acute intracranial findings. 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 Electronically Signed   By: Ellery Plunk M.D.   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 Right visual loss occurring over  the past couple weeks. Subsequent encounter. EXAM:  MR HEAD WITHOUT CONTRAST MR CIRCLE OF WILLIS WITHOUT CONTRAST MRA OF THE NECK WITHOUT AND WITH CONTRAST TECHNIQUE: Multiplanar, multiecho pulse sequences of the brain and surrounding structures were obtained according to standard protocol without intravenous contrast.; Angiographic images of the Circle of Willis were obtained using MRA technique without intravenous contrast.; Multiplanar and multiecho pulse sequences of the neck were obtained without and with intravenous contrast. Angiographic images of the neck were obtained using MRA technique without and with intravenous contast. CONTRAST:  9 cc MultiHance. Half dose contrast utilized secondary to GFR of 39. COMPARISON:  09/27/2015 CT.  No comparison MR. FINDINGS: MR HEAD FINDINGS Abnormal signal within the left optic nerve, optic chiasm bilaterally greater on the left and along the optic tracts bilaterally greater on the left. This includes restricted motion on diffusion imaging. Although ischemia has been clinically considered as a possible cause of visual loss, the distribution raises possibility of result of demyelinating process (neuromyelitis optica), inflammatory process, infectious process or vasculitis as a cause of the optic nerve/chiasm/ track abnormality. Tumor is a less likely consideration. The present brain MR was performed without contrast. Patient received contrast for MR angiogram of the neck. Patient has a low GFR of 39 and half dose contrast was utilized. If dedicated contrast enhanced orbital MR were to be obtained, exam should be delayed for at least 48 hours (to avoid multiple dosing of gadolinium in a patient with a low GFR secondary to possibility of developing nephrogenic systemic fibrosis) and patient's GFR should be rechecked prior to administration contrast (contact neuro radiologist if there is any question). Minimal exophthalmos. Symmetric normal appearance of the extra-ocular  muscles. Mild to moderate nonspecific periventricular and subcortical white matter changes. Typically in a hypertensive patient of this age, these white matter changes are felt to be related to result of chronic microvascular changes. Tiny area of altered signal intensity deep dependent aspect of the left lateral ventricle without fluid level may represent a tiny calcification rather intracranial hemorrhage. Overall no intracranial hemorrhage noted. 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 Global atrophy without hydrocephalus. Minimal mucosal thickening ethmoid sinus air cells. MR CIRCLE OF WILLIS FINDINGS Artifact extends through the vertebral arteries which are patent on MR angiogram of the neck. Nonvisualized right posterior inferior cerebellar artery and left anterior inferior cerebellar artery. No significant stenosis of the basilar artery. Mild irregularity and narrowing of portions of the superior cerebellar arteries and distal posterior cerebral artery branches bilaterally. Mild to moderate narrowing distal M1 segment left middle cerebral artery. Otherwise anterior circulation without medium or large size vessel significant stenosis or occlusion. Branch vessel irregularity. No aneurysm noted. MRA NECK FINDINGS Three vessel arch. Mild to moderate narrowing proximal right subclavian artery. Plaque right carotid bifurcation without measurable stenosis of the proximal right internal carotid artery. Plaque left carotid bifurcation with 34% diameter stenosis proximal left internal carotid artery. Codominant vertebral arteries without significant narrowing. IMPRESSION: MR HEAD Abnormal signal within the left optic nerve, optic chiasm bilaterally greater on the left and along the optic tracts bilaterally greater on the left. This includes restricted motion on diffusion imaging. Although ischemia has been clinically considered as a possible cause of visual  loss, the distribution raises possibility of result of demyelinating process (neuromyelitis optica), inflammatory process, infectious process or vasculitis as a cause of the optic nerve/chiasm/ track abnormality. Tumor is a less likely consideration. Prior to considering contrast enhanced orbital MR, please see above discussion. MR CIRCLE OF WILLIS Nonvisualized  right posterior inferior cerebellar artery and left anterior inferior cerebellar artery. Mild irregularity and narrowing of portions of the superior cerebellar arteries and distal posterior cerebral artery branches bilaterally. Mild to moderate narrowing distal M1 segment left middle cerebral artery. Otherwise anterior circulation without medium or large size vessel significant stenosis or occlusion. Branch vessel irregularity. MRA NECK Mild to moderate narrowing proximal right subclavian artery. Plaque right carotid bifurcation without measurable stenosis of the proximal right internal carotid artery. Plaque left carotid bifurcation with 34% diameter stenosis proximal left internal carotid artery. Codominant vertebral arteries without significant narrowing. These results will be called to the ordering clinician or representative by the Radiologist Assistant, and communication documented in the PACS or zVision Dashboard. Electronically Signed   By: Lacy Duverney M.D.   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 Right visual loss occurring over the past couple weeks. Subsequent encounter. EXAM: MR HEAD WITHOUT CONTRAST MR CIRCLE OF WILLIS WITHOUT CONTRAST MRA OF THE NECK WITHOUT AND WITH CONTRAST TECHNIQUE: Multiplanar, multiecho pulse sequences of the brain and surrounding structures were obtained according to standard protocol without intravenous contrast.; Angiographic images of the Circle of Willis were obtained using  MRA technique without intravenous contrast.; Multiplanar and multiecho pulse sequences of the neck were obtained without and with intravenous contrast. Angiographic images of the neck were obtained using MRA technique without and with intravenous contast. CONTRAST:  9 cc MultiHance. Half dose contrast utilized secondary to GFR of 39. COMPARISON:  09/27/2015 CT.  No comparison MR. FINDINGS: MR HEAD FINDINGS Abnormal signal within the left optic nerve, optic chiasm bilaterally greater on the left and along the optic tracts bilaterally greater on the left. This includes restricted motion on diffusion imaging. Although ischemia has been clinically considered as a possible cause of visual loss, the distribution raises possibility of result of demyelinating process (neuromyelitis optica), inflammatory process, infectious process or vasculitis as a cause of the optic nerve/chiasm/ track abnormality. Tumor is a less likely consideration. The present brain MR was performed without contrast. Patient received contrast for MR angiogram of the neck. Patient has a low GFR of 39 and half dose contrast was utilized. If dedicated contrast enhanced orbital MR were to be obtained, exam should be delayed for at least 48 hours (to avoid multiple dosing of gadolinium in a patient with a low GFR secondary to possibility of developing nephrogenic systemic fibrosis) and patient's GFR should be rechecked prior to administration contrast (contact neuro radiologist if there is any question). Minimal exophthalmos. Symmetric normal appearance of the extra-ocular muscles. Mild to moderate nonspecific periventricular and subcortical white matter changes. Typically in a hypertensive patient of this age, these white matter changes are felt to be related to result of chronic microvascular changes. Tiny area of altered signal intensity deep dependent aspect of the left lateral ventricle without fluid level may represent a tiny calcification rather  intracranial hemorrhage. Overall no intracranial hemorrhage noted. 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 Global atrophy without hydrocephalus. Minimal mucosal thickening ethmoid sinus air cells. MR CIRCLE OF WILLIS FINDINGS Artifact extends through the vertebral arteries which are patent on MR angiogram of the neck. Nonvisualized right posterior inferior cerebellar artery and left anterior inferior cerebellar artery. No significant stenosis of the basilar artery. Mild irregularity and narrowing of portions of the superior cerebellar arteries and distal posterior  cerebral artery branches bilaterally. Mild to moderate narrowing distal M1 segment left middle cerebral artery. Otherwise anterior circulation without medium or large size vessel significant stenosis or occlusion. Branch vessel irregularity. No aneurysm noted. MRA NECK FINDINGS Three vessel arch. Mild to moderate narrowing proximal right subclavian artery. Plaque right carotid bifurcation without measurable stenosis of the proximal right internal carotid artery. Plaque left carotid bifurcation with 34% diameter stenosis proximal left internal carotid artery. Codominant vertebral arteries without significant narrowing. IMPRESSION: MR HEAD Abnormal signal within the left optic nerve, optic chiasm bilaterally greater on the left and along the optic tracts bilaterally greater on the left. This includes restricted motion on diffusion imaging. Although ischemia has been clinically considered as a possible cause of visual loss, the distribution raises possibility of result of demyelinating process (neuromyelitis optica), inflammatory process, infectious process or vasculitis as a cause of the optic nerve/chiasm/ track abnormality. Tumor is a less likely consideration. Prior to considering contrast enhanced orbital MR, please see above discussion. MR CIRCLE OF WILLIS Nonvisualized right posterior inferior  cerebellar artery and left anterior inferior cerebellar artery. Mild irregularity and narrowing of portions of the superior cerebellar arteries and distal posterior cerebral artery branches bilaterally. Mild to moderate narrowing distal M1 segment left middle cerebral artery. Otherwise anterior circulation without medium or large size vessel significant stenosis or occlusion. Branch vessel irregularity. MRA NECK Mild to moderate narrowing proximal right subclavian artery. Plaque right carotid bifurcation without measurable stenosis of the proximal right internal carotid artery. Plaque left carotid bifurcation with 34% diameter stenosis proximal left internal carotid artery. Codominant vertebral arteries without significant narrowing. These results will be called to the ordering clinician or representative by the Radiologist Assistant, and communication documented in the PACS or zVision Dashboard. Electronically Signed   By: Lacy DuverneySteven  Olson M.D.   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 Right visual loss occurring over the past couple weeks. Subsequent encounter. EXAM: MR HEAD WITHOUT CONTRAST MR CIRCLE OF WILLIS WITHOUT CONTRAST MRA OF THE NECK WITHOUT AND WITH CONTRAST TECHNIQUE: Multiplanar, multiecho pulse sequences of the brain and surrounding structures were obtained according to standard protocol without intravenous contrast.; Angiographic images of the Circle of Willis were obtained using MRA technique without intravenous contrast.; Multiplanar and multiecho pulse sequences of the neck were obtained without and with intravenous contrast. Angiographic images of the neck were obtained using MRA technique without and with intravenous contast. CONTRAST:  9 cc MultiHance. Half dose contrast utilized secondary to GFR of 39. COMPARISON:  09/27/2015 CT.  No comparison MR.  FINDINGS: MR HEAD FINDINGS Abnormal signal within the left optic nerve, optic chiasm bilaterally greater on the left and along the optic tracts bilaterally greater on the left. This includes restricted motion on diffusion imaging. Although ischemia has been clinically considered as a possible cause of visual loss, the distribution raises possibility of result of demyelinating process (neuromyelitis optica), inflammatory process, infectious process or vasculitis as a cause of the optic nerve/chiasm/ track abnormality. Tumor is a less likely consideration. The present brain MR was performed without contrast. Patient received contrast for MR angiogram of the neck. Patient has a low GFR of 39 and half dose contrast was utilized. If dedicated contrast enhanced orbital MR were to be obtained, exam should be delayed for at least 48 hours (to avoid multiple dosing of gadolinium in a patient with a  low GFR secondary to possibility of developing nephrogenic systemic fibrosis) and patient's GFR should be rechecked prior to administration contrast (contact neuro radiologist if there is any question). Minimal exophthalmos. Symmetric normal appearance of the extra-ocular muscles. Mild to moderate nonspecific periventricular and subcortical white matter changes. Typically in a hypertensive patient of this age, these white matter changes are felt to be related to result of chronic microvascular changes. Tiny area of altered signal intensity deep dependent aspect of the left lateral ventricle without fluid level may represent a tiny calcification rather intracranial hemorrhage. Overall no intracranial hemorrhage noted. 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 Global atrophy without hydrocephalus. Minimal mucosal thickening ethmoid sinus air cells. MR CIRCLE OF WILLIS FINDINGS Artifact extends through the vertebral arteries which are patent on MR angiogram of the neck.  Nonvisualized right posterior inferior cerebellar artery and left anterior inferior cerebellar artery. No significant stenosis of the basilar artery. Mild irregularity and narrowing of portions of the superior cerebellar arteries and distal posterior cerebral artery branches bilaterally. Mild to moderate narrowing distal M1 segment left middle cerebral artery. Otherwise anterior circulation without medium or large size vessel significant stenosis or occlusion. Branch vessel irregularity. No aneurysm noted. MRA NECK FINDINGS Three vessel arch. Mild to moderate narrowing proximal right subclavian artery. Plaque right carotid bifurcation without measurable stenosis of the proximal right internal carotid artery. Plaque left carotid bifurcation with 34% diameter stenosis proximal left internal carotid artery. Codominant vertebral arteries without significant narrowing. IMPRESSION: MR HEAD Abnormal signal within the left optic nerve, optic chiasm bilaterally greater on the left and along the optic tracts bilaterally greater on the left. This includes restricted motion on diffusion imaging. Although ischemia has been clinically considered as a possible cause of visual loss, the distribution raises possibility of result of demyelinating process (neuromyelitis optica), inflammatory process, infectious process or vasculitis as a cause of the optic nerve/chiasm/ track abnormality. Tumor is a less likely consideration. Prior to considering contrast enhanced orbital MR, please see above discussion. MR CIRCLE OF WILLIS Nonvisualized right posterior inferior cerebellar artery and left anterior inferior cerebellar artery. Mild irregularity and narrowing of portions of the superior cerebellar arteries and distal posterior cerebral artery branches bilaterally. Mild to moderate narrowing distal M1 segment left middle cerebral artery. Otherwise anterior circulation without medium or large size vessel significant stenosis or occlusion.  Branch vessel irregularity. MRA NECK Mild to moderate narrowing proximal right subclavian artery. Plaque right carotid bifurcation without measurable stenosis of the proximal right internal carotid artery. Plaque left carotid bifurcation with 34% diameter stenosis proximal left internal carotid artery. Codominant vertebral arteries without significant narrowing. These results will be called to the ordering clinician or representative by the Radiologist Assistant, and communication documented in the PACS or zVision Dashboard. Electronically Signed   By: Lacy Duverney M.D.   On: 09/28/2015 12:46    EKG:   Orders placed or performed in visit on 01/20/04  . 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 Ophthalmology consult to evaluate right eye for any similar condition.  #Chronic atrial fibrillation (HCC) - continue rate controlling medications and anticoagulation with Coumadin and INR at 1.99    # HTN (hypertension) - blood pressure is better today. Continue close monitoring. continue home meds   #HLD (hyperlipidemia) - continue home meds, LDL at 120   #abnormal TSH-check free T4 and T3    occupational therapy is recommending home health OT    All the records are reviewed and case discussed with Care Management/Social Workerr. Management plans discussed with the patient, and his son Mr. [phone]. They both verbalized understanding of the plan   CODE STATUS: fc, son is the HCPOA  TOTAL TIME TAKING CARE OF THIS PATIENT: 35 minutes.   POSSIBLE D/C IN 2  DAYS, DEPENDING ON CLINICAL CONDITION.   Ramonita Lab M.D on 09/29/2015 at 1:54 PM  Between 7am to 6pm - Pager - [phone] After 6pm go to [email or web address] - password [email or web address] [facility]  [facility]  Office  [phone]  CC: Primary care physician; Marguarite Arbour, MD

## 2015-09-29 NOTE — Progress Notes (Signed)
Physical Therapy Treatment Patient Details Name: Luis RamsayLorin B Martensen MRN: [id number] DOB: 1940/09/27 Today's Date: 09/29/2015    History of Present Illness Patient is a 75 y/o male that presents with recent history of L central retinal occlusion (2-3 weeks ago) and new onset R repipheral vision loss. 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     PT Comments    Pt has no voiced complaints other than visual deficits. Agreeable to PT. Pt physically able to perform all mobility tasks well in terns of motion and strength. Visual deficits make ambulation and stair climbing tasks challenging; however, pt is able to perform well with only Min guard and directional cues to navigate. Pt does not require any assistive devices. Pt performs stairs reciprocally without effort using 1 handrail; however, encouraged to perform step to for an added safety measure. Pt understands and demonstrates well. Pt educated on seated and supine exercises to perform several times a day to avoid issues of weakness and/or stiffness. Pt is accustom to exercises and quite familiar with exercises taught. Continue PT to improve balance and ambulation to adapt to visual deficits for improved safety with functional mobility.   Follow Up Recommendations  Home health PT     Equipment Recommendations       Recommendations for Other Services       Precautions / Restrictions Restrictions Weight Bearing Restrictions: No    Mobility  Bed Mobility Overal bed mobility: Independent                Transfers Overall transfer level: Needs assistance Equipment used: None Transfers: Sit to/from Stand Sit to Stand: Supervision         General transfer comment: Use of bed rail due to vision deficits to steady self in stand  Ambulation/Gait Ambulation/Gait assistance: Min guard Ambulation Distance (Feet): 330 Feet Assistive device: None Gait Pattern/deviations: Step-through  pattern;Decreased stride length Gait velocity: reduced Gait velocity interpretation: Below normal speed for age/gender General Gait Details: Decreased stide seemingly due to visual deficits primarily to ensure footing and navigating about obstacles. No loss of balance and follows directional cues well.    Stairs Stairs: Yes Stairs assistance: Min guard Stair Management: One rail Left;Alternating pattern;Step to pattern Number of Stairs: 6 General stair comments: Pt initially performed reciprocally; encouraged to perform with step to pattern for increased safety ensurring foot on step and greater control. Pt able to perform step to well also.  Wheelchair Mobility    Modified Rankin (Stroke Patients Only)       Balance Overall balance assessment: Needs assistance Sitting-balance support: Feet supported Sitting balance-Leahy Scale: Normal     Standing balance support: No upper extremity supported Standing balance-Leahy Scale: Good Standing balance comment: Visual deficits require increased cues for verbal visual imput and directional instructions. Pt requires use of hands to feel for objects as well to navigate at times.                     Cognition Arousal/Alertness: Awake/alert Behavior During Therapy: WFL for tasks assessed/performed Overall Cognitive Status: Within Functional Limits for tasks assessed                      Exercises General Exercises - Lower Extremity Long Arc Quad: AROM;AAROM;Both;10 reps;Seated Hip ABduction/ADduction: AROM;Strengthening;Both;10 reps;Supine Straight Leg Raises: Strengthening;Both;10 reps;Supine Hip Flexion/Marching: AROM;Strengthening;Both;10 reps;Seated Toe Raises: AROM;Both;10 reps;Seated Heel Raises: AROM;Both;10 reps;Seated Mini-Sqauts: Strengthening;Both;10 reps;Standing    General Comments  Pertinent Vitals/Pain Pain Assessment: No/denies pain    Home Living                      Prior  Function            PT Goals (current goals can now be found in the care plan section) Progress towards PT goals: Progressing toward goals    Frequency  Min 2X/week    PT Plan Current plan remains appropriate    Co-evaluation             End of Session Equipment Utilized During Treatment: Gait belt Activity Tolerance: Patient tolerated treatment well Patient left: in bed;with call bell/phone within reach;with bed alarm set;with family/visitor present     Time: 1402-1440 PT Time Calculation (min) (ACUTE ONLY): 38 min  Charges:                       G CodesKristeen Miss, PTA 09/29/2015, 3:08 PM

## 2015-09-29 NOTE — Care Management (Signed)
Presented to [facility] with the diagnosis of stroke. Lives with wife, Luis Byrd. Son is Luis Byrd [phone]). Last seen Luis Byrd a couple of months ago. Home Health through [facility] in 2014 following knee replacement. No skilled facility. No home oxygen. Cane and rolling walker in the home, if needed. No falls. Appetite "too good." Takes care of all basic activities of daily living himself, doesn't drive anymore. Daughter -in -law and son helps with errands. Son will transport. Physical therapy evaluation completed. Recommends home health and physical therapy in the home. Discussed home health agencies. Would like to discuss with family before making decision. Luis Byrd [phone]

## 2015-09-29 NOTE — Evaluation (Signed)
Occupational Therapy Evaluation Patient Details Name: Luis Byrd MRN: [id number] DOB: [birth year] Today's Date: 09/29/2015    History of Present Illness Patient is a 75 y/o male that presents with recent history of L central retinal occlusion (2-3 weeks ago) and new onset R repipheral vision loss. 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    Clinical Impression   Pt. Is a 75 y.o. male who presents with Left retinal occlusion (2-3 weeks ago), and new onset of right sided peripheral vision loss. Pt.'s new onset of visual impairments hinder ADL functioning, and IADL tasks caring for his wife. Pt. Could benefit from skilled OT services for education about visual compensatory techniques during ADLs, IADLs, and home modification to improve independence and safety.    Follow Up Recommendations    Home Health OT   Equipment Recommendations       Recommendations for Other Services       Precautions / Restrictions        Mobility Bed Mobility                  Transfers  Pt. Up in chair upon arrival                      Balance                                            ADL Overall ADL's : Needs assistance/impaired Eating/Feeding: Set up   Grooming: Set up               Lower Body Dressing: Supervision/safety;Set up                 General ADL Comments: pt. requires set-up for ADL tasks, and verbal cues for location of items secondary to vision loss.     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   Perception     Praxis      Pertinent Vitals/Pain Pain Assessment: 0-10 Pain Score: 0-No pain     Hand Dominance     Extremity/Trunk Assessment Upper Extremity Assessment Upper Extremity Assessment: Overall WFL for tasks assessed            Communication Communication Communication: No difficulties   Cognition                           General Comments       Exercises       Shoulder Instructions      Home Living Family/patient expects to be discharged to:: Private residence Living Arrangements: Spouse/significant other Available Help at Discharge: Family Type of Home: House Home Access: Stairs to enter   Entrance Stairs-Rails: Left Home Layout: One level     Bathroom Shower/Tub: Tub/shower unit;Door Shower/tub characteristics: Door Firefighter: Standard Bathroom Accessibility: Yes   Home Equipment: Environmental consultant - 2 wheels;Cane - single point;Shower seat          Prior Functioning/Environment Level of Independence: Independent        Comments: Patient reports no falls, no recent use of AD.     OT Diagnosis: Generalized weakness;Blindness and low vision   OT Problem List: Impaired vision/perception;Decreased safety awareness;Decreased activity tolerance;Decreased knowledge of use  of DME or AE   OT Treatment/Interventions: Self-care/ADL training;Therapeutic activities;Patient/family education;Visual/perceptual remediation/compensation    OT Goals(Current goals can be found in the care plan section)    OT Frequency: Min 1X/week   Barriers to D/C:            Co-evaluation              End of Session    Activity Tolerance: Patient tolerated treatment well Patient left: in chair;with chair alarm set;with family/visitor present   Time: 0950-1020 OT Time Calculation (min): 30 min Charges:  OT General Charges $OT Visit: 1 Procedure OT Evaluation $OT Eval Moderate Complexity: 1 Procedure G-Codes:    Olegario MessierElaine Martavia Tye, MS, OTR/L Olegario MessierElaine Sanaai Doane 09/29/2015, 10:36 AM

## 2015-09-29 NOTE — Progress Notes (Addendum)
Speech Therapy Note: received order, reviewed chart notes. Consulted NSG and met w/ pt. No reports of swallowing difficulty were given; pt conversed at conversational level w/ no Cognitive-linguistic deficits noted. 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 Speech intelligible. Pt does have vision deficits and is somewhat HOH baseline; new vision changes as well. No skilled ST services indicated at this time. NSG to reconsult if necessary. Pt agreed.

## 2015-09-29 NOTE — Care Management Important Message (Signed)
Important Message  Patient Details  Name: Faye RamsayLorin B Pates MRN: [id number] Date of Birth: 12-Jun-1940   Medicare Important Message Given:  Yes    Olegario MessierKathy A Tianna Baus 09/29/2015, 10:38 AM

## 2015-09-29 NOTE — Progress Notes (Signed)
ANTICOAGULATION CONSULT NOTE - Follow Up Consult  Pharmacy Consult for Warfarin dosing Indication: atrial fibrillation  No Known Allergies  Patient Measurements: Height: 5\' 9"  (175.3 cm) Weight: 190 lb (86.183 kg) IBW/kg (Calculated) : 70.7 Heparin Dosing Weight: na  Vital Signs: Temp: 98.7 F (37.1 C) (05/08 0833) Temp Source: Oral (05/08 0833) BP: 159/91 mmHg (05/08 0833) Pulse Rate: 75 (05/08 0833)  Labs:  Recent Labs  09/27/15 1858 09/28/15 0922 09/29/15 0420  HGB 14.5  --   --   HCT 43.6  --   --   PLT 142*  --   --   LABPROT 33.3* 32.0* 22.5*  INR 3.36 3.18 1.99  CREATININE 1.66* 1.42* 1.25*    Estimated Creatinine Clearance: 54.7 mL/min (by C-G formula based on Cr of 1.25).   Medications:  Prescriptions prior to admission  Medication Sig Dispense Refill Last Dose  . amLODipine (NORVASC) 10 MG tablet Take 10 mg by mouth 2 (two) times daily.   09/27/2015 at Unknown time  . DIGOX 125 MCG tablet Take 125 mcg by mouth daily.   09/27/2015 at Unknown time  . docusate sodium (COLACE) 50 MG capsule Take 150 mg by mouth at bedtime.    09/26/2015 at Unknown time  . Glucosamine-Chondroitin 250-200 MG TABS Take 2 tablets by mouth daily.   09/27/2015 at Unknown time  . hydrochlorothiazide (HYDRODIURIL) 25 MG tablet Take 25 mg by mouth 2 (two) times daily.   09/27/2015 at Unknown time  . OVER THE COUNTER MEDICATION Take 2 tablets by mouth daily. Jonette Eva*Antiiva* for prostate health   09/27/2015 at Unknown time  . oxyCODONE-acetaminophen (PERCOCET/ROXICET) 5-325 MG tablet Take 1 tablet by mouth every 6 (six) hours as needed. For pain.   09/27/2015 at Unknown time  . potassium chloride SA (K-DUR,KLOR-CON) 20 MEQ tablet Take 40 mEq by mouth 2 (two) times daily.   09/27/2015 at Unknown time  . predniSONE (DELTASONE) 10 MG tablet Take 10 mg by mouth daily.   09/27/2015 at Unknown time  . warfarin (COUMADIN) 2 MG tablet Take 6 mg by mouth daily.   09/26/2015 at Unknown time  . Zinc 50 MG TABS Take 50 mg by  mouth every morning.   09/27/2015 at Unknown time    Assessment: Patient is a 75yo male admitted with loss of vision. Patient was taking Coumadin 6mg  daily prior to admission for afib. INR was supratherapeutic on admission, appears that last dose of Coumadin was on 09/26/15.  5/6 INR: 3.36, no warfarin 5/7 INR: 3.18, no warfarin 5/8 INR: 1.99  Goal of Therapy:  INR 2-3  Plan:  Will order Warfarin 5mg  daily and check daily INRs.  Clovia CuffLisa Jett Kulzer, PharmD, BCPS 09/29/2015 10:16 AM

## 2015-09-29 NOTE — Consult Note (Signed)
Reason for Consult: decreased vision Referring Physician:  Dr. Revonda Humphrey is an 75 y.o. male.  Chief complaint: decreased vision in right eye and "stumbling around."  History of decreased vision in the left eye.  Stroke [facility])  HPI:    Pleasant gentleman with PMH significant for afib and hypercholesterolemia, on coumadin who presents for urgent evaluation of vision loss.   3 weeks ago, he experienced progressive loss of vision in the left eye.  This started nasally, and progressed to involved the entire field of vision.  It was accompanied by mild eye pain which persisted until about 3 days ago.  He was evaluated at [facility] by Dr. 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  Visual fields performed at time demonstrated normal field in the right eye and a central island of vision in the left eye.  Starting about 2 days ago he experienced painless decreased vision in the right eye and dizziness, "stumbling around."  He called the Festus Aloe MD on call Dr. Edison Pace, who instructed him to go the ED for a stroke evaluation.  He has had a CT, MRI/MRA of the brain and neck and carotid dopplers of the neck as well as a neurology consult.   He denies jaw claudication, temple tenderness, headache, fevers, weight loss.  Past Medical History  Diagnosis Date  . Hypertension   . Arthritis   . Chronic a-fib ([place])   . CHF (congestive heart failure) ([place])   . HLD (hyperlipidemia)   . BPH (benign prostatic hyperplasia)     ROS  Past Surgical History  Procedure Laterality Date  . Hernia repair    . Knee surgery      Family History  Problem Relation Age of Onset  . Heart attack Father   . Cancer Mother     Social History:  reports that he has quit smoking. He does not have any smokeless tobacco history on file. He reports  that he does not drink alcohol or use illicit drugs.  Allergies: No Known Allergies  Medications: reviewed in epic.  Significant for warfarin and 71m prednisone po outpatient, now taking high dose IV steroids.  Results for orders placed or performed during the hospital encounter of 09/27/15 (from the past 48 hour(s))  Basic metabolic panel     Status: Abnormal   Collection Time: 09/27/15  6:58 PM  Result Value Ref Range   Sodium 140 135 - 145 mmol/L   Potassium 3.2 (L) 3.5 - 5.1 mmol/L   Chloride 100 (L) 101 - 111 mmol/L   CO2 30 22 - 32 mmol/L   Glucose, Bld 149 (H) 65 - 99 mg/dL   BUN 27 (H) 6 - 20 mg/dL   Creatinine, Ser 1.66 (H) 0.61 - 1.24 mg/dL   Calcium 9.7 8.9 - 10.3 mg/dL   GFR calc non Af Amer 38 (L) >60 mL/min   GFR calc Af Amer 45 (L) >60 mL/min    Comment: (NOTE) The eGFR has been calculated using the CKD EPI equation. This calculation has not been validated in all clinical situations. eGFR's persistently <60 mL/min signify possible Chronic Kidney Disease.    Anion gap 10 5 - 15  CBC with Differential     Status: Abnormal   Collection Time: 09/27/15  6:58 PM  Result Value Ref Range  WBC 5.7 3.8 - 10.6 K/uL   RBC 4.99 4.40 - 5.90 MIL/uL   Hemoglobin 14.5 13.0 - 18.0 g/dL   HCT 43.6 40.0 - 52.0 %   MCV 87.4 80.0 - 100.0 fL   MCH 29.0 26.0 - 34.0 pg   MCHC 33.2 32.0 - 36.0 g/dL   RDW 14.7 (H) 11.5 - 14.5 %   Platelets 142 (L) 150 - 440 K/uL   Neutrophils Relative % 74 %   Neutro Abs 4.2 1.4 - 6.5 K/uL   Lymphocytes Relative 15 %   Lymphs Abs 0.9 (L) 1.0 - 3.6 K/uL   Monocytes Relative 8 %   Monocytes Absolute 0.4 0.2 - 1.0 K/uL   Eosinophils Relative 2 %   Eosinophils Absolute 0.1 0 - 0.7 K/uL   Basophils Relative 1 %   Basophils Absolute 0.0 0 - 0.1 K/uL  Protime-INR     Status: Abnormal   Collection Time: 09/27/15  6:58 PM  Result Value Ref Range   Prothrombin Time 33.3 (H) 11.4 - 15.0 seconds   INR 3.36   Hemoglobin A1c     Status: None    Collection Time: 09/28/15  6:08 AM  Result Value Ref Range   Hgb A1c MFr Bld 5.8 4.0 - 6.0 %  Lipid panel     Status: Abnormal   Collection Time: 09/28/15  6:08 AM  Result Value Ref Range   Cholesterol 186 0 - 200 mg/dL   Triglycerides 201 (H) <150 mg/dL   HDL 26 (L) >40 mg/dL   Total CHOL/HDL Ratio 7.2 RATIO   VLDL 40 0 - 40 mg/dL   LDL Cholesterol 120 (H) 0 - 99 mg/dL    Comment:        Total Cholesterol/HDL:CHD Risk Coronary Heart Disease Risk Table                     Men   Women  1/2 Average Risk   3.4   3.3  Average Risk       5.0   4.4  2 X Average Risk   9.6   7.1  3 X Average Risk  23.4   11.0        Use the calculated Patient Ratio above and the CHD Risk Table to determine the patient's CHD Risk.        ATP III CLASSIFICATION (LDL):  <100     mg/dL   Optimal  100-129  mg/dL   Near or Above                    Optimal  130-159  mg/dL   Borderline  160-189  mg/dL   High  >190     mg/dL   Very High   Glucose, capillary     Status: Abnormal   Collection Time: 09/28/15  7:45 AM  Result Value Ref Range   Glucose-Capillary 109 (H) 65 - 99 mg/dL  Protime-INR     Status: Abnormal   Collection Time: 09/28/15  9:22 AM  Result Value Ref Range   Prothrombin Time 32.0 (H) 11.4 - 15.0 seconds   INR 4.65   Basic metabolic panel     Status: Abnormal   Collection Time: 09/28/15  9:22 AM  Result Value Ref Range   Sodium 138 135 - 145 mmol/L   Potassium 2.8 (LL) 3.5 - 5.1 mmol/L    Comment: CRITICAL RESULT CALLED TO, READ BACK BY AND VERIFIED WITH DANIELLE JACOBS ON 09/28/15  AT 0956 BY [facility]    Chloride 98 (L) 101 - 111 mmol/L   CO2 32 22 - 32 mmol/L   Glucose, Bld 132 (H) 65 - 99 mg/dL   BUN 25 (H) 6 - 20 mg/dL   Creatinine, Ser 1.42 (H) 0.61 - 1.24 mg/dL   Calcium 9.2 8.9 - 10.3 mg/dL   GFR calc non Af Amer 46 (L) >60 mL/min   GFR calc Af Amer 54 (L) >60 mL/min    Comment: (NOTE) The eGFR has been calculated using the CKD EPI equation. This calculation has not been  validated in all clinical situations. eGFR's persistently <60 mL/min signify possible Chronic Kidney Disease.    Anion gap 8 5 - 15  Magnesium     Status: None   Collection Time: 09/28/15  2:46 PM  Result Value Ref Range   Magnesium 1.8 1.7 - 2.4 mg/dL  Sedimentation rate     Status: None   Collection Time: 09/28/15  2:46 PM  Result Value Ref Range   Sed Rate 6 0 - 20 mm/hr  TSH     Status: Abnormal   Collection Time: 09/28/15  2:46 PM  Result Value Ref Range   TSH 0.331 (L) 0.350 - 4.500 uIU/mL  Potassium     Status: None   Collection Time: 09/28/15  6:16 PM  Result Value Ref Range   Potassium 3.9 3.5 - 5.1 mmol/L  Protime-INR     Status: Abnormal   Collection Time: 09/29/15  4:20 AM  Result Value Ref Range   Prothrombin Time 22.5 (H) 11.4 - 15.0 seconds   INR 0.17   Basic metabolic panel     Status: Abnormal   Collection Time: 09/29/15  4:20 AM  Result Value Ref Range   Sodium 137 135 - 145 mmol/L   Potassium 3.8 3.5 - 5.1 mmol/L   Chloride 100 (L) 101 - 111 mmol/L   CO2 25 22 - 32 mmol/L   Glucose, Bld 166 (H) 65 - 99 mg/dL   BUN 28 (H) 6 - 20 mg/dL   Creatinine, Ser 1.25 (H) 0.61 - 1.24 mg/dL   Calcium 8.7 (L) 8.9 - 10.3 mg/dL   GFR calc non Af Amer 54 (L) >60 mL/min   GFR calc Af Amer >60 >60 mL/min    Comment: (NOTE) The eGFR has been calculated using the CKD EPI equation. This calculation has not been validated in all clinical situations. eGFR's persistently <60 mL/min signify possible Chronic Kidney Disease.    Anion gap 12 5 - 15  Magnesium     Status: None   Collection Time: 09/29/15  4:20 AM  Result Value Ref Range   Magnesium 1.8 1.7 - 2.4 mg/dL    Ct Head Wo Contrast  09/27/2015  CLINICAL DATA:  Fever.  Loss of vision. EXAM: CT HEAD WITHOUT CONTRAST TECHNIQUE: Contiguous axial images were obtained from the base of the skull through the vertex without intravenous contrast. COMPARISON:  None. FINDINGS: There is no intracranial hemorrhage, mass or  evidence of acute infarction. There is mild generalized atrophy. There is white matter hypodensity which likely chronic and due to small vessel disease. 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 IMPRESSION: No acute intracranial findings. 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 Electronically Signed   By: Andreas Newport M.D.   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 Right visual loss occurring over the past couple weeks. Subsequent encounter. EXAM: MR HEAD WITHOUT CONTRAST MR CIRCLE OF WILLIS WITHOUT CONTRAST MRA OF THE NECK WITHOUT AND WITH CONTRAST TECHNIQUE: Multiplanar, multiecho pulse sequences of the brain and surrounding structures were obtained according to standard protocol without intravenous contrast.; Angiographic images of the Circle of Willis were obtained using MRA technique without intravenous contrast.; Multiplanar and multiecho pulse sequences of the neck were obtained without and with intravenous contrast. Angiographic images of the neck were obtained using MRA technique without and with intravenous contast. CONTRAST:  9 cc MultiHance. Half dose contrast utilized secondary to GFR of 39. COMPARISON:  09/27/2015 CT.  No comparison MR. FINDINGS: MR HEAD FINDINGS Abnormal signal within the left optic nerve, optic chiasm bilaterally greater on the left and along the optic tracts bilaterally greater on the left. This includes restricted motion on diffusion imaging. Although ischemia has been clinically considered as a possible cause of visual loss, the distribution raises possibility of result of demyelinating process (neuromyelitis optica), inflammatory process, infectious process or vasculitis as a cause of the optic  nerve/chiasm/ track abnormality. Tumor is a less likely consideration. The present brain MR was performed without contrast. Patient received contrast for MR angiogram of the neck. Patient has a low GFR of 39 and half dose contrast was utilized. If dedicated contrast enhanced orbital MR were to be obtained, exam should be delayed for at least 48 hours (to avoid multiple dosing of gadolinium in a patient with a low GFR secondary to possibility of developing nephrogenic systemic fibrosis) and patient's GFR should be rechecked prior to administration contrast (contact neuro radiologist if there is any question). Minimal exophthalmos. Symmetric normal appearance of the extra-ocular muscles. Mild to moderate nonspecific periventricular and subcortical white matter changes. Typically in a hypertensive patient of this age, these white matter changes are felt to be related to result of chronic microvascular changes. Tiny area of altered signal intensity deep dependent aspect of the left lateral ventricle without fluid level may represent a tiny calcification rather intracranial hemorrhage. Overall no intracranial hemorrhage noted. 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 Global atrophy without hydrocephalus. Minimal mucosal thickening ethmoid sinus air cells. MR CIRCLE OF WILLIS FINDINGS Artifact extends through the vertebral arteries which are patent on MR angiogram of the neck. Nonvisualized right posterior inferior cerebellar artery and left anterior inferior cerebellar artery. No significant stenosis of the basilar artery. Mild irregularity and narrowing of portions of the superior cerebellar arteries and distal posterior cerebral artery branches bilaterally. Mild to moderate narrowing distal M1 segment left middle cerebral artery. Otherwise anterior circulation without medium or large size vessel significant stenosis or occlusion. Branch vessel irregularity. No  aneurysm noted. MRA NECK FINDINGS Three vessel arch. Mild to moderate narrowing proximal right subclavian artery. Plaque right carotid bifurcation without measurable stenosis of the proximal right internal carotid artery. Plaque left carotid bifurcation with 34% diameter stenosis proximal left internal carotid artery. Codominant vertebral arteries without significant narrowing. IMPRESSION: MR HEAD Abnormal signal within the left optic nerve, optic chiasm bilaterally greater on the left and along the optic tracts bilaterally greater on the left. This includes restricted motion on diffusion imaging. Although ischemia has been clinically considered as a possible cause of visual loss, the distribution raises possibility of result of demyelinating process (neuromyelitis optica), inflammatory process, infectious process or vasculitis as a cause of  the optic nerve/chiasm/ track abnormality. Tumor is a less likely consideration. Prior to considering contrast enhanced orbital MR, please see above discussion. MR CIRCLE OF WILLIS Nonvisualized right posterior inferior cerebellar artery and left anterior inferior cerebellar artery. Mild irregularity and narrowing of portions of the superior cerebellar arteries and distal posterior cerebral artery branches bilaterally. Mild to moderate narrowing distal M1 segment left middle cerebral artery. Otherwise anterior circulation without medium or large size vessel significant stenosis or occlusion. Branch vessel irregularity. MRA NECK Mild to moderate narrowing proximal right subclavian artery. Plaque right carotid bifurcation without measurable stenosis of the proximal right internal carotid artery. Plaque left carotid bifurcation with 34% diameter stenosis proximal left internal carotid artery. Codominant vertebral arteries without significant narrowing. These results will be called to the ordering clinician or representative by the Radiologist Assistant, and communication documented  in the PACS or zVision Dashboard. Electronically Signed   By: Genia Del M.D.   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 Right visual loss occurring over the past couple weeks. Subsequent encounter. EXAM: MR HEAD WITHOUT CONTRAST MR CIRCLE OF WILLIS WITHOUT CONTRAST MRA OF THE NECK WITHOUT AND WITH CONTRAST TECHNIQUE: Multiplanar, multiecho pulse sequences of the brain and surrounding structures were obtained according to standard protocol without intravenous contrast.; Angiographic images of the Circle of Willis were obtained using MRA technique without intravenous contrast.; Multiplanar and multiecho pulse sequences of the neck were obtained without and with intravenous contrast. Angiographic images of the neck were obtained using MRA technique without and with intravenous contast. CONTRAST:  9 cc MultiHance. Half dose contrast utilized secondary to GFR of 39. COMPARISON:  09/27/2015 CT.  No comparison MR. FINDINGS: MR HEAD FINDINGS Abnormal signal within the left optic nerve, optic chiasm bilaterally greater on the left and along the optic tracts bilaterally greater on the left. This includes restricted motion on diffusion imaging. Although ischemia has been clinically considered as a possible cause of visual loss, the distribution raises possibility of result of demyelinating process (neuromyelitis optica), inflammatory process, infectious process or vasculitis as a cause of the optic nerve/chiasm/ track abnormality. Tumor is a less likely consideration. The present brain MR was performed without contrast. Patient received contrast for MR angiogram of the neck. Patient has a low GFR of 39 and half dose contrast was utilized. If dedicated contrast enhanced orbital MR were to be obtained, exam should be delayed for at least 48 hours (to avoid multiple dosing of gadolinium  in a patient with a low GFR secondary to possibility of developing nephrogenic systemic fibrosis) and patient's GFR should be rechecked prior to administration contrast (contact neuro radiologist if there is any question). Minimal exophthalmos. Symmetric normal appearance of the extra-ocular muscles. Mild to moderate nonspecific periventricular and subcortical white matter changes. Typically in a hypertensive patient of this age, these white matter changes are felt to be related to result of chronic microvascular changes. Tiny area of altered signal intensity deep dependent aspect of the left lateral ventricle without fluid level may represent a tiny calcification rather intracranial hemorrhage. Overall no intracranial hemorrhage noted. 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 Global atrophy without hydrocephalus. Minimal mucosal thickening ethmoid sinus air cells. MR CIRCLE OF WILLIS FINDINGS Artifact extends through the vertebral arteries which are patent on MR angiogram of the neck. Nonvisualized right posterior inferior cerebellar artery  and left anterior inferior cerebellar artery. No significant stenosis of the basilar artery. Mild irregularity and narrowing of portions of the superior cerebellar arteries and distal posterior cerebral artery branches bilaterally. Mild to moderate narrowing distal M1 segment left middle cerebral artery. Otherwise anterior circulation without medium or large size vessel significant stenosis or occlusion. Branch vessel irregularity. No aneurysm noted. MRA NECK FINDINGS Three vessel arch. Mild to moderate narrowing proximal right subclavian artery. Plaque right carotid bifurcation without measurable stenosis of the proximal right internal carotid artery. Plaque left carotid bifurcation with 34% diameter stenosis proximal left internal carotid artery. Codominant vertebral arteries without significant narrowing. IMPRESSION:  MR HEAD Abnormal signal within the left optic nerve, optic chiasm bilaterally greater on the left and along the optic tracts bilaterally greater on the left. This includes restricted motion on diffusion imaging. Although ischemia has been clinically considered as a possible cause of visual loss, the distribution raises possibility of result of demyelinating process (neuromyelitis optica), inflammatory process, infectious process or vasculitis as a cause of the optic nerve/chiasm/ track abnormality. Tumor is a less likely consideration. Prior to considering contrast enhanced orbital MR, please see above discussion. MR CIRCLE OF WILLIS Nonvisualized right posterior inferior cerebellar artery and left anterior inferior cerebellar artery. Mild irregularity and narrowing of portions of the superior cerebellar arteries and distal posterior cerebral artery branches bilaterally. Mild to moderate narrowing distal M1 segment left middle cerebral artery. Otherwise anterior circulation without medium or large size vessel significant stenosis or occlusion. Branch vessel irregularity. MRA NECK Mild to moderate narrowing proximal right subclavian artery. Plaque right carotid bifurcation without measurable stenosis of the proximal right internal carotid artery. Plaque left carotid bifurcation with 34% diameter stenosis proximal left internal carotid artery. Codominant vertebral arteries without significant narrowing. These results will be called to the ordering clinician or representative by the Radiologist Assistant, and communication documented in the PACS or zVision Dashboard. Electronically Signed   By: Genia Del M.D.   On: 09/28/2015 12:46   Mr Jodene Nam Head/brain Wo Cm  09/28/2015  CLINICAL DATA:  75 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 Right visual loss occurring over the past couple weeks. Subsequent encounter. EXAM: MR HEAD WITHOUT CONTRAST MR CIRCLE OF  WILLIS WITHOUT CONTRAST MRA OF THE NECK WITHOUT AND WITH CONTRAST TECHNIQUE: Multiplanar, multiecho pulse sequences of the brain and surrounding structures were obtained according to standard protocol without intravenous contrast.; Angiographic images of the Circle of Willis were obtained using MRA technique without intravenous contrast.; Multiplanar and multiecho pulse sequences of the neck were obtained without and with intravenous contrast. Angiographic images of the neck were obtained using MRA technique without and with intravenous contast. CONTRAST:  9 cc MultiHance. Half dose contrast utilized secondary to GFR of 39. COMPARISON:  09/27/2015 CT.  No comparison MR. FINDINGS: MR HEAD FINDINGS Abnormal signal within the left optic nerve, optic chiasm bilaterally greater on the left and along the optic tracts bilaterally greater on the left. This includes restricted motion on diffusion imaging. Although ischemia has been clinically considered as a possible cause of visual loss, the distribution raises possibility of result of demyelinating process (neuromyelitis optica), inflammatory process, infectious process or vasculitis as a cause of the optic nerve/chiasm/ track abnormality. Tumor is a less likely consideration. The present brain MR was performed without contrast. Patient received contrast for MR angiogram of the neck. Patient has a low GFR of 39 and half dose contrast was utilized. If dedicated contrast  enhanced orbital MR were to be obtained, exam should be delayed for at least 48 hours (to avoid multiple dosing of gadolinium in a patient with a low GFR secondary to possibility of developing nephrogenic systemic fibrosis) and patient's GFR should be rechecked prior to administration contrast (contact neuro radiologist if there is any question). Minimal exophthalmos. Symmetric normal appearance of the extra-ocular muscles. Mild to moderate nonspecific periventricular and subcortical white matter changes.  Typically in a hypertensive patient of this age, these white matter changes are felt to be related to result of chronic microvascular changes. Tiny area of altered signal intensity deep dependent aspect of the left lateral ventricle without fluid level may represent a tiny calcification rather intracranial hemorrhage. Overall no intracranial hemorrhage noted. 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 Global atrophy without hydrocephalus. Minimal mucosal thickening ethmoid sinus air cells. MR CIRCLE OF WILLIS FINDINGS Artifact extends through the vertebral arteries which are patent on MR angiogram of the neck. Nonvisualized right posterior inferior cerebellar artery and left anterior inferior cerebellar artery. No significant stenosis of the basilar artery. Mild irregularity and narrowing of portions of the superior cerebellar arteries and distal posterior cerebral artery branches bilaterally. Mild to moderate narrowing distal M1 segment left middle cerebral artery. Otherwise anterior circulation without medium or large size vessel significant stenosis or occlusion. Branch vessel irregularity. No aneurysm noted. MRA NECK FINDINGS Three vessel arch. Mild to moderate narrowing proximal right subclavian artery. Plaque right carotid bifurcation without measurable stenosis of the proximal right internal carotid artery. Plaque left carotid bifurcation with 34% diameter stenosis proximal left internal carotid artery. Codominant vertebral arteries without significant narrowing. IMPRESSION: MR HEAD Abnormal signal within the left optic nerve, optic chiasm bilaterally greater on the left and along the optic tracts bilaterally greater on the left. This includes restricted motion on diffusion imaging. Although ischemia has been clinically considered as a possible cause of visual loss, the distribution raises possibility of result of demyelinating process (neuromyelitis  optica), inflammatory process, infectious process or vasculitis as a cause of the optic nerve/chiasm/ track abnormality. Tumor is a less likely consideration. Prior to considering contrast enhanced orbital MR, please see above discussion. MR CIRCLE OF WILLIS Nonvisualized right posterior inferior cerebellar artery and left anterior inferior cerebellar artery. Mild irregularity and narrowing of portions of the superior cerebellar arteries and distal posterior cerebral artery branches bilaterally. Mild to moderate narrowing distal M1 segment left middle cerebral artery. Otherwise anterior circulation without medium or large size vessel significant stenosis or occlusion. Branch vessel irregularity. MRA NECK Mild to moderate narrowing proximal right subclavian artery. Plaque right carotid bifurcation without measurable stenosis of the proximal right internal carotid artery. Plaque left carotid bifurcation with 34% diameter stenosis proximal left internal carotid artery. Codominant vertebral arteries without significant narrowing. These results will be called to the ordering clinician or representative by the Radiologist Assistant, and communication documented in the PACS or zVision Dashboard. Electronically Signed   By: Genia Del M.D.   On: 09/28/2015 12:46    Blood pressure 127/74, pulse 76, temperature 97.9 F (36.6 C), temperature source Oral, resp. rate 18, height 5' 9"  (1.753 m), weight 86.183 kg (190 lb), SpO2 96 %.  Mental status: Alert and Oriented x 4  Visual Acuity:   20/70+ near card with OTC readers OD Hand motion OS    Pupils:  Round/, left is unreactive to light with an afferent pupillary defect (by reverse).  Right is poorly reactive to light 3-->2.  Motility:  Grossly full, poor effort in upgaze OU.  Visual Fields:  Very poor, severely constricted in the left eye.  In the right eye by confrontation there is full temporal field defect respecting the vertical midline.    IOP:  Soft by  palpation.  External/ Lids/ Lashes:  Normal  Anterior Segment:  Conjunctiva:  Normal  OU  Cornea:  Normal  OU  Anterior Chamber: Normal  OU  Lens:   Moderate nuclear sclerotic cataract OU  Posterior Segment: Dilated OU with 1% Tropicamide and 2.5% Phenylephrine  20 and 28 D lens exam:              Vitreous:                      PVD in the right eye.  Discs:   In the right, c:d: 0.3 c/d ratio, no pallor, no edema, no hemorrhages.     In the left, the disc is pale.   Macula:  Right: normal:     Left:  Severe pallor, scattered hemorrhages.  Vessels/ Periphery: Right: normal:     Left: vascular attenuation.      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  MRI demonstrates abnormal signal within the left optic nerve, optic chiasm bilaterally greater on the left and along the optic tracts bilaterally greater on the left.  Clinical exam is significant for  1.  poorly reactive pupils with an APD in the left eye 2.  Left optic nerve ischemia and retinal ischemia consistent with central retinal artery occlusion. 3.  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 4.  Normal appearing optic nerve and retina in the right eye.   Assessment:   1.  Inflammatory optic neuritis involving the chiasm, and  causing new decreased vision in the right eye including temporal hemianopsia.  Low suspicion for giant cell arteritis given normal ESR, CRP, low platelets.  He has been on 10 mg prednisone by mouth which could blunt inflammatory markers, but I would expect to see some elevation in ESR or CRP if temporal arteritis were the underlying disease.  2.  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  Very poor visual prognosis for the left eye. Although the patient has carotid plaques and  risk factors for CVA, I suspect the cause of the CRAO may be an inflammatory vasculitis given the MRI findings.  Emboli from afib is also a possibility, but the patient has been anticoagulated and this would not explain the enhancement of the visual pathways on MRI.    Plan: 1.  Agree with high dose IV steroids. 2.  Consider transfer to tertiary center such as [facility] for further evaluation. 3.  Consider rheumatology consult for further evaluation of inflammatory vasculitic process.   4.  No further ophthalmic specific treatment indicated at this time, but will need ongoing outpatient ophthalmology followup to evaluate for potential complications from  Ocular neovascularization.   Benay Pillow  09/29/2015, 8:28 AM  [phone]

## 2015-09-30 LAB — T4, FREE: Free T4: 0.97 ng/dL (ref 0.61–1.12)

## 2015-09-30 LAB — PROTIME-INR
INR: 1.5
PROTHROMBIN TIME: 18.2 s — AB (ref 11.4–15.0)

## 2015-09-30 MED ORDER — WARFARIN SODIUM 4 MG PO TABS
6.0000 mg | ORAL_TABLET | Freq: Once | ORAL | Status: AC
  Administered 2015-09-30: 18:00:00 6 mg via ORAL
  Filled 2015-09-30: qty 2

## 2015-09-30 NOTE — Progress Notes (Signed)
ANTICOAGULATION CONSULT NOTE - Follow Up Consult  Pharmacy Consult for Warfarin dosing Indication: atrial fibrillation  No Known Allergies  Patient Measurements: Height: 5\' 9"  (175.3 cm) Weight: 190 lb (86.183 kg) IBW/kg (Calculated) : 70.7 Heparin Dosing Weight: na  Vital Signs: Temp: 97.7 F (36.5 C) (05/09 0503) Temp Source: Oral (05/09 0503) BP: 135/75 mmHg (05/09 0503) Pulse Rate: 68 (05/09 0503)  Labs:  Recent Labs  09/27/15 1858 09/28/15 0922 09/29/15 0420 09/30/15 0548  HGB 14.5  --   --   --   HCT 43.6  --   --   --   PLT 142*  --   --   --   LABPROT 33.3* 32.0* 22.5* 18.2*  INR 3.36 3.18 1.99 1.50  CREATININE 1.66* 1.42* 1.25*  --     Estimated Creatinine Clearance: 54.7 mL/min (by C-G formula based on Cr of 1.25).   Medications:  Prescriptions prior to admission  Medication Sig Dispense Refill Last Dose  . amLODipine (NORVASC) 10 MG tablet Take 10 mg by mouth 2 (two) times daily.   09/27/2015 at Unknown time  . DIGOX 125 MCG tablet Take 125 mcg by mouth daily.   09/27/2015 at Unknown time  . docusate sodium (COLACE) 50 MG capsule Take 150 mg by mouth at bedtime.    09/26/2015 at Unknown time  . Glucosamine-Chondroitin 250-200 MG TABS Take 2 tablets by mouth daily.   09/27/2015 at Unknown time  . hydrochlorothiazide (HYDRODIURIL) 25 MG tablet Take 25 mg by mouth 2 (two) times daily.   09/27/2015 at Unknown time  . OVER THE COUNTER MEDICATION Take 2 tablets by mouth daily. Jonette Eva*Antiiva* for prostate health   09/27/2015 at Unknown time  . oxyCODONE-acetaminophen (PERCOCET/ROXICET) 5-325 MG tablet Take 1 tablet by mouth every 6 (six) hours as needed. For pain.   09/27/2015 at Unknown time  . potassium chloride SA (K-DUR,KLOR-CON) 20 MEQ tablet Take 40 mEq by mouth 2 (two) times daily.   09/27/2015 at Unknown time  . predniSONE (DELTASONE) 10 MG tablet Take 10 mg by mouth daily.   09/27/2015 at Unknown time  . warfarin (COUMADIN) 2 MG tablet Take 6 mg by mouth daily.   09/26/2015 at  Unknown time  . Zinc 50 MG TABS Take 50 mg by mouth every morning.   09/27/2015 at Unknown time    Assessment: Patient is a 75yo male admitted with loss of vision. Patient was taking Coumadin 6mg  daily prior to admission for afib. INR was supratherapeutic on admission, appears that last dose of Coumadin was on 09/26/15.  5/6 INR: 3.36, no warfarin 5/7 INR: 3.18, no warfarin 5/8 INR: 1.99, warfarin 5mg  5/9 INR: 1.50  INR is now subtherapeutic due to dose being held for 3 days.  Goal of Therapy:  INR 2-3  Plan:  Will order Warfarin 6mg  for today and check daily INRs.  Clovia CuffLisa Illias Pantano, PharmD, BCPS 09/30/2015 10:04 AM

## 2015-09-30 NOTE — Progress Notes (Signed)
Physical Therapy Treatment Patient Details Name: Luis Byrd MRN: [id number] DOB: 1941-01-28 Today's Date: 09/30/2015    History of Present Illness Patient is a 75 y/o male that presents with recent history of L central retinal occlusion (2-3 weeks ago) and new onset R repipheral vision loss. 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     PT Comments    Pt notes vision has worsened this morning; notes only seeing a "sliver" out of his right eye. Pt ambulates over 400 ft requiring Min guard for safety and verbal cues for navigation and occasionally to avoid objects. Pt reaches for rail on left when available. Discussed use of rolling walker for greater security with ambulation due to visual deficits and pt notes he does have rolling walker at home. Discussed session with nursing initially and later case management for possible other more specialized therapy/services that may benefit pt more in dealing with vision loss and adapting to this with function, possibly through blind association. Pt may benefit from learning to use walking stick or seeing eye dog more so than traditional PT. Care management notes they will look into services available. Continue with PT to progress ambulation safety. Suggest using rolling walker next session to compare quality/safety/confidence with ambulation.  Follow Up Recommendations  Home health PT     Equipment Recommendations       Recommendations for Other Services       Precautions / Restrictions Restrictions Weight Bearing Restrictions: No    Mobility  Bed Mobility Overal bed mobility: Independent                Transfers Overall transfer level: Needs assistance Equipment used: None Transfers: Sit to/from Stand Sit to Stand: Supervision         General transfer comment: Use of bed rail due to vision deficits to steady self in stand  Ambulation/Gait Ambulation/Gait assistance: Min  guard Ambulation Distance (Feet): 450 Feet (later in room around bed to chair) Assistive device: None Gait Pattern/deviations: Step-through pattern;Decreased stride length   Gait velocity interpretation:  (subjectively about baseline) General Gait Details: Uses rail on left where available. Occasional bumping shoulder into soap dispensers on L. Pt notes primarily blurry vision with ability to see straight ahead and on L side; no R peripheral vision.    Stairs            Wheelchair Mobility    Modified Rankin (Stroke Patients Only)       Balance Overall balance assessment: Needs assistance   Sitting balance-Leahy Scale: Normal     Standing balance support: No upper extremity supported Standing balance-Leahy Scale: Good Standing balance comment: Visual deficits require increased cues for verbal visual input and directional instructions. Pt requires use of hands to feels for objects and L rail to navigate at times.                     Cognition Arousal/Alertness: Awake/alert Behavior During Therapy: WFL for tasks assessed/performed Overall Cognitive Status: Within Functional Limits for tasks assessed                      Exercises      General Comments        Pertinent Vitals/Pain Pain Assessment: No/denies pain    Home Living                      Prior Function  PT Goals (current goals can now be found in the care plan section) Progress towards PT goals: Progressing toward goals    Frequency  Min 2X/week    PT Plan Current plan remains appropriate    Co-evaluation             End of Session Equipment Utilized During Treatment: Gait belt Activity Tolerance: Patient tolerated treatment well Patient left: in chair;with call bell/phone within reach;with chair alarm set     Time:  -     Charges:  $Gait Training: 23-37 mins                    G Codes:      Kristeen MissHeidi Elizabeth Bishop, PTA 09/30/2015, 11:39  AM

## 2015-09-30 NOTE — Progress Notes (Signed)
[facility] [place] [facility]   PATIENT NAME: Luis Byrd    MR#:  [id number]  DATE OF BIRTH:  Apr 06, 1941  SUBJECTIVE:  CHIEF COMPLAINT:  Patient is Resting comfortably, no overnight events , reporting loss of vision on the left side a few weeks ago and now Some improvement of vision on the right side with steroids. No other complaints Hard of hearing.   REVIEW OF SYSTEMS:  CONSTITUTIONAL: No fever, fatigue or weakness.  EYES: Reporting loss of vision in the left side and now evolving on the right,But somewhat better with steroids EARS, NOSE, AND THROAT: No tinnitus or ear pain.  RESPIRATORY: No cough, shortness of breath, wheezing or hemoptysis.  CARDIOVASCULAR: No chest pain, orthopnea, edema.  GASTROINTESTINAL: No nausea, vomiting, diarrhea or abdominal pain.  GENITOURINARY: No dysuria, hematuria.  ENDOCRINE: No polyuria, nocturia,  HEMATOLOGY: No anemia, easy bruising or bleeding SKIN: No rash or lesion. MUSCULOSKELETAL: No joint pain or arthritis.   NEUROLOGIC: No tingling, numbness, weakness.  PSYCHIATRY: No anxiety or depression.   DRUG ALLERGIES:  No Known Allergies  VITALS:  Blood pressure 150/85, pulse 64, temperature 98 F (36.7 C), temperature source Oral, resp. rate 18, height  (1.753 m), weight 86.183 kg (190 lb), SpO2 96 %.  PHYSICAL EXAMINATION:  GENERAL:  75 y.o.-year-old patient lying in the bed with no acute distress.  EYES: Pupils equal, round, reactive to light and accommodation. No scleral icterus. Extraocular muscles intact.  HEENT: Head atraumatic, normocephalic. Oropharynx and nasopharynx clear.  NECK:  Supple, no jugular venous distention. No thyroid enlargement, no tenderness.  LUNGS: Normal breath sounds bilaterally, no wheezing, rales,rhonchi or crepitation. No use of accessory muscles of respiration.  CARDIOVASCULAR: S1, S2 normal. No murmurs, rubs, or gallops.  ABDOMEN: Soft, nontender, nondistended. Bowel  sounds present. No organomegaly or mass.  EXTREMITIES: No pedal edema, cyanosis, or clubbing.  NEUROLOGIC: Left eye is blind and decreased vision on the right  Muscle strength 5/5 in all extremities. Sensation intact. Gait not checked.  PSYCHIATRIC: The patient is alert and oriented x 3.  SKIN: No obvious rash, lesion, or ulcer.    LABORATORY PANEL:   CBC  Recent Labs Lab 09/27/15 1858  WBC 5.7  HGB 14.5  HCT 43.6  PLT 142*   ------------------------------------------------------------------------------------------------------------------  Chemistries   Recent Labs Lab 09/29/15 0420  NA 137  K 3.8  CL 100*  CO2 25  GLUCOSE 166*  BUN 28*  CREATININE 1.25*  CALCIUM 8.7*  MG 1.8   ------------------------------------------------------------------------------------------------------------------  Cardiac Enzymes No results for input(s): TROPONINI in the last 168 hours. ------------------------------------------------------------------------------------------------------------------  RADIOLOGY:  No results found.  EKG:   Orders placed or performed in visit on 01/20/04  . 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 Thad Ranger  lipids, LDL 120 and A1c 5.8 Echocardiogram done, left ventricular ejection fraction 55%.   [facility] and [facility]R at 1.5 pharmacy to manage  #  Retinal artery occlusion, 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 Ophthalmology  Is recommending to continue Solu-Medrol IV for a total of 12 doses. Rheumatology consult is pending  #Chronic atrial fibrillation (HCC) - continue rate controlling medications and anticoagulation with Coumadin and INR at 1. 5   # HTN (hypertension) - blood pressure is better today. Continue close monitoring. continue home meds   #HLD (hyperlipidemia) - continue home meds, LDL at 120   #abnormal TSH- free T4 is normal and T3 pending   occupational therapy is recommending home health OT    All the records are reviewed and case discussed with Care Management/Social Workerr. Management plans discussed with the patient, and his son Mr. [phone]. They both verbalized understanding of the plan   CODE STATUS: fc, son is the HCPOA  TOTAL TIME TAKING CARE OF THIS PATIENT: 35 minutes.   POSSIBLE Byrd/C IN 1-2  DAYS, DEPENDING ON CLINICAL CONDITION.   Ramonita LabGouru, Luis Byrd M.Byrd on 09/30/2015 at 1:50 PM  Between 7am to 6pm - Pager - [phone] After 6pm go to [email or web address] - password [email or web address] [email or web address]  [facility]  Office  [phone]  CC: Primary care physician; Luis ArbourSPARKS,Luis D, MD

## 2015-09-30 NOTE — Consult Note (Signed)
Reason for Consult: Sudden loss of vision  Referring Physician: Dr. Donnelly StagerGouru  Luis Byrd   HPI: 75 year old white male. Long history of atrial fibrillation treated with Coumadin. Status post cardioversion which failed. Some congestive heart failure. 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 He was seen by ophthalmology. Diagnosed with central retinal artery occlusion. Had headache. No jaw claudication. No morning stiffness or joint symptoms Normal sedimentation rate. Thought to be related to CVA. Carotid ultrasound showed atherosclerotic disease but no rate limiting lesions He had been anticoagulated that time. Since then he's developed vision loss in the right. Hemianopsia. Lost lateral vision. MRA and MRI showed abnormal signal in the left optic nerve and bilateral abnormal signals in the optic chiasm and along the optic tracts. Felt to be consistent with neuromyelitis optica. He's had no weakness in his upper and lower extremities. There's been no difficulties with his speech. Repeat sedimentation rate normal. ANCA negative. CBC normal except mild thrombocytopenia with platelets of 142,000. Creatinine 1.25. Echocardiogram did not show significant valvular disease. Ejection fraction 50%. Pro time 18. He has been treated with IV steroids. No significant improvement in the right. No return of vision to the left eye. No recurrent headache  PMH: Atrial fibrillation. Osteoarthritis. Congestive heart failure.  SURGICAL HISTORY: Bilateral knee replacements. hernia repairs  Family History: Negative for connective tissue diseases or MS  Social History: No cigarettes or alcohol  Allergies: No Known Allergies  Medications:  Scheduled: . amLODipine  10 mg Oral Daily  . digoxin  0.125 mg Oral Daily  . methylPREDNISolone (SOLU-MEDROL) injection  250 mg Intravenous Q6H  . warfarin  6 mg Oral  ONCE-1800  . Warfarin - Pharmacist Dosing Inpatient   Does not apply q1800        ROS:No recurrent headache. No shoulder pain. No neck pain no jaw stiffness. No dysphagia. No oral or genital ulcers. No history of thrombosis. No morning stiffness. No history of joint pain   PHYSICAL EXAM: Blood pressure 123/69, pulse 56, temperature 97.7 F (36.5 C), temperature source Oral, resp. rate 18, height 5\' 9"  (1.753 m), weight 86.183 kg (190 lb), SpO2 100 %. No acute distress. Seen sitting in the room in a chair. Sclera clear. Left pupil does not react to light. The right does relate to light. Gross visual fields showed neglect of the right lateral field. Can  count fingers with direct view. No nystagmus. Oropharynx is clear. Scalp without discoid lesions. Good carotid upstroke. Irregular rhythm. No significant murmur. No gallop. No visceromegaly. No significant edema Good range of motion neck and shoulders. Hands without telangiectasias or sclerodactyly or synovitis. Hips move well. Both knees have been operated Symmetric upper and lower extremity flexes and power  Assessment: Bilateral vision loss with MRI consistent with neuromyelitis optica. Low suspicion for temporal arteritis or other systemic vasculitis. No significant response after 2 g of IV Solu-Medrol Atrial fibrillation on chronic Coumadin History of mild cardiomegaly without significant congestive failure symptoms Osteoporosis arthritis the knee status post replacement  Recommendations: Aquaporin-4 IgG serum autoantibody determination Consider plasmapheresis. Would require transfer to Medical Center After acute treatment, may need chronic immunosuppression. Consider rituximab   Kandyce RudKERNODLE JR, Luis Byrd 09/30/2015, 5:25 PM

## 2015-09-30 NOTE — Progress Notes (Signed)
Subjective: No improvement in vision.    Objective: Current vital signs: BP 135/75 mmHg  Pulse 68  Temp(Src) 97.7 F (36.5 C) (Oral)  Resp 18  Ht 5\' 9"  (1.753 m)  Wt 86.183 kg (190 lb)  BMI 28.05 kg/m2  SpO2 97% Vital signs in last 24 hours: Temp:  [97.7 F (36.5 C)-98 F (36.7 C)] 97.7 F (36.5 C) (05/09 0503) Pulse Rate:  [68-96] 68 (05/09 0503) Resp:  [18-20] 18 (05/09 0503) BP: (124-156)/(71-78) 135/75 mmHg (05/09 0503) SpO2:  [97 %-98 %] 97 % (05/09 0503)  Intake/Output from previous day: 05/08 0701 - 05/09 0700 In: 1392 [P.O.:1080; IV Piggyback:312] Out: 600 [Urine:600] Intake/Output this shift: Total I/O In: 240 [P.O.:240] Out: 200 [Urine:200] Nutritional status: Diet Heart Room service appropriate?: Yes; Fluid consistency:: Thin  Neurologic Exam: Mental Status: Alert, oriented, thought content appropriate. Speech fluent without evidence of aphasia. Able to follow 3 step commands without difficulty. 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   III,IV, VI: ptosis not present, extra-ocular motions intact bilaterally V,VII: smile symmetric, facial light touch sensation normal bilaterally VIII: hearing normal bilaterally IX,X: gag reflex present XI: bilateral shoulder shrug XII: midline tongue extension  Lab Results: Basic Metabolic Panel:  Recent Labs Lab 09/27/15 1858 09/28/15 0922 09/28/15 1446 09/28/15 1816 09/29/15 0420  NA 140 138  --   --  137  K 3.2* 2.8*  --  3.9 3.8  CL 100* 98*  --   --  100*  CO2 30 32  --   --  25  GLUCOSE 149* 132*  --   --  166*  BUN 27* 25*  --   --  28*  CREATININE 1.66* 1.42*  --   --  1.25*  CALCIUM 9.7 9.2  --   --  8.7*  MG  --   --  1.8  --  1.8    Liver Function Tests: No results for input(s): AST, ALT, ALKPHOS, BILITOT, PROT, ALBUMIN in the last 168 hours. No results for input(s): LIPASE, AMYLASE in the last 168 hours. No results for input(s): AMMONIA in the  last 168 hours.  CBC:  Recent Labs Lab 09/27/15 1858  WBC 5.7  NEUTROABS 4.2  HGB 14.5  HCT 43.6  MCV 87.4  PLT 142*    Cardiac Enzymes: No results for input(s): CKTOTAL, CKMB, CKMBINDEX, TROPONINI in the last 168 hours.  Lipid Panel:  Recent Labs Lab 09/28/15 0608  CHOL 186  TRIG 201*  HDL 26*  CHOLHDL 7.2  VLDL 40  LDLCALC 161*    CBG:  Recent Labs Lab 09/28/15 0745  GLUCAP 109*    Microbiology: Results for orders placed or performed in visit on 01/02/13  Urine culture     Status: None   Collection Time: 01/02/13 10:14 AM  Result Value Ref Range Status   Micro Text Report   Final       SOURCE: [facility] CATCH    ORGANISM 1                1000 CFU/ML GRAM NEGATIVE ROD   ANTIBIOTIC                                                      MRSA PCR Screening     Status: None  Collection Time: 01/02/13 10:25 AM  Result Value Ref Range Status   Micro Text Report   Final       COMMENT                   NEGATIVE - MRSA target DNA not detected   ANTIBIOTIC                                                        Coagulation Studies:  Recent Labs  09/27/15 1858 09/28/15 0922 09/29/15 0420 09/30/15 0548  LABPROT 33.3* 32.0* 22.5* 18.2*  INR 3.36 3.18 1.99 1.50    Imaging: Mr Angiogram Neck W Wo Contrast  09/28/2015  CLINICAL DATA:  75 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 Right visual loss occurring over the past couple weeks. Subsequent encounter. EXAM: MR HEAD WITHOUT CONTRAST MR CIRCLE OF WILLIS WITHOUT CONTRAST MRA OF THE NECK WITHOUT AND WITH CONTRAST TECHNIQUE: Multiplanar, multiecho pulse sequences of the brain and surrounding structures were obtained according to standard protocol without intravenous contrast.; Angiographic images of the Circle of Willis were obtained using MRA technique without intravenous contrast.; Multiplanar and multiecho pulse sequences of the neck were obtained  without and with intravenous contrast. Angiographic images of the neck were obtained using MRA technique without and with intravenous contast. CONTRAST:  9 cc MultiHance. Half dose contrast utilized secondary to GFR of 39. COMPARISON:  09/27/2015 CT.  No comparison MR. FINDINGS: MR HEAD FINDINGS Abnormal signal within the left optic nerve, optic chiasm bilaterally greater on the left and along the optic tracts bilaterally greater on the left. This includes restricted motion on diffusion imaging. Although ischemia has been clinically considered as a possible cause of visual loss, the distribution raises possibility of result of demyelinating process (neuromyelitis optica), inflammatory process, infectious process or vasculitis as a cause of the optic nerve/chiasm/ track abnormality. Tumor is a less likely consideration. The present brain MR was performed without contrast. Patient received contrast for MR angiogram of the neck. Patient has a low GFR of 39 and half dose contrast was utilized. If dedicated contrast enhanced orbital MR were to be obtained, exam should be delayed for at least 48 hours (to avoid multiple dosing of gadolinium in a patient with a low GFR secondary to possibility of developing nephrogenic systemic fibrosis) and patient's GFR should be rechecked prior to administration contrast (contact neuro radiologist if there is any question). Minimal exophthalmos. Symmetric normal appearance of the extra-ocular muscles. Mild to moderate nonspecific periventricular and subcortical white matter changes. Typically in a hypertensive patient of this age, these white matter changes are felt to be related to result of chronic microvascular changes. Tiny area of altered signal intensity deep dependent aspect of the left lateral ventricle without fluid level may represent a tiny calcification rather intracranial hemorrhage. Overall no intracranial hemorrhage noted. 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 Global atrophy without hydrocephalus. Minimal mucosal thickening ethmoid sinus air cells. MR CIRCLE OF WILLIS FINDINGS Artifact extends through the vertebral arteries which are patent on MR angiogram of the neck. Nonvisualized right posterior inferior cerebellar artery and left anterior inferior cerebellar artery. No significant stenosis of the basilar artery. Mild irregularity and narrowing of portions of the superior cerebellar arteries and  distal posterior cerebral artery branches bilaterally. Mild to moderate narrowing distal M1 segment left middle cerebral artery. Otherwise anterior circulation without medium or large size vessel significant stenosis or occlusion. Branch vessel irregularity. No aneurysm noted. MRA NECK FINDINGS Three vessel arch. Mild to moderate narrowing proximal right subclavian artery. Plaque right carotid bifurcation without measurable stenosis of the proximal right internal carotid artery. Plaque left carotid bifurcation with 34% diameter stenosis proximal left internal carotid artery. Codominant vertebral arteries without significant narrowing. IMPRESSION: MR HEAD Abnormal signal within the left optic nerve, optic chiasm bilaterally greater on the left and along the optic tracts bilaterally greater on the left. This includes restricted motion on diffusion imaging. Although ischemia has been clinically considered as a possible cause of visual loss, the distribution raises possibility of result of demyelinating process (neuromyelitis optica), inflammatory process, infectious process or vasculitis as a cause of the optic nerve/chiasm/ track abnormality. Tumor is a less likely consideration. Prior to considering contrast enhanced orbital MR, please see above discussion. MR CIRCLE OF WILLIS Nonvisualized right posterior inferior cerebellar artery and left anterior inferior cerebellar artery. Mild irregularity and narrowing of portions of  the superior cerebellar arteries and distal posterior cerebral artery branches bilaterally. Mild to moderate narrowing distal M1 segment left middle cerebral artery. Otherwise anterior circulation without medium or large size vessel significant stenosis or occlusion. Branch vessel irregularity. MRA NECK Mild to moderate narrowing proximal right subclavian artery. Plaque right carotid bifurcation without measurable stenosis of the proximal right internal carotid artery. Plaque left carotid bifurcation with 34% diameter stenosis proximal left internal carotid artery. Codominant vertebral arteries without significant narrowing. These results will be called to the ordering clinician or representative by the Radiologist Assistant, and communication documented in the PACS or zVision Dashboard. Electronically Signed   By: Lacy Duverney M.D.   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 Right visual loss occurring over the past couple weeks. Subsequent encounter. EXAM: MR HEAD WITHOUT CONTRAST MR CIRCLE OF WILLIS WITHOUT CONTRAST MRA OF THE NECK WITHOUT AND WITH CONTRAST TECHNIQUE: Multiplanar, multiecho pulse sequences of the brain and surrounding structures were obtained according to standard protocol without intravenous contrast.; Angiographic images of the Circle of Willis were obtained using MRA technique without intravenous contrast.; Multiplanar and multiecho pulse sequences of the neck were obtained without and with intravenous contrast. Angiographic images of the neck were obtained using MRA technique without and with intravenous contast. CONTRAST:  9 cc MultiHance. Half dose contrast utilized secondary to GFR of 39. COMPARISON:  09/27/2015 CT.  No comparison MR. FINDINGS: MR HEAD FINDINGS Abnormal signal within the left optic nerve, optic chiasm bilaterally greater on the left  and along the optic tracts bilaterally greater on the left. This includes restricted motion on diffusion imaging. Although ischemia has been clinically considered as a possible cause of visual loss, the distribution raises possibility of result of demyelinating process (neuromyelitis optica), inflammatory process, infectious process or vasculitis as a cause of the optic nerve/chiasm/ track abnormality. Tumor is a less likely consideration. The present brain MR was performed without contrast. Patient received contrast for MR angiogram of the neck. Patient has a low GFR of 39 and half dose contrast was utilized. If dedicated contrast enhanced orbital MR were to be obtained, exam should be delayed for at least 48 hours (to avoid multiple dosing of gadolinium in a patient with  a low GFR secondary to possibility of developing nephrogenic systemic fibrosis) and patient's GFR should be rechecked prior to administration contrast (contact neuro radiologist if there is any question). Minimal exophthalmos. Symmetric normal appearance of the extra-ocular muscles. Mild to moderate nonspecific periventricular and subcortical white matter changes. Typically in a hypertensive patient of this age, these white matter changes are felt to be related to result of chronic microvascular changes. Tiny area of altered signal intensity deep dependent aspect of the left lateral ventricle without fluid level may represent a tiny calcification rather intracranial hemorrhage. Overall no intracranial hemorrhage noted. 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 Global atrophy without hydrocephalus. Minimal mucosal thickening ethmoid sinus air cells. MR CIRCLE OF WILLIS FINDINGS Artifact extends through the vertebral arteries which are patent on MR angiogram of the neck. Nonvisualized right posterior inferior cerebellar artery and left anterior inferior cerebellar artery. No significant  stenosis of the basilar artery. Mild irregularity and narrowing of portions of the superior cerebellar arteries and distal posterior cerebral artery branches bilaterally. Mild to moderate narrowing distal M1 segment left middle cerebral artery. Otherwise anterior circulation without medium or large size vessel significant stenosis or occlusion. Branch vessel irregularity. No aneurysm noted. MRA NECK FINDINGS Three vessel arch. Mild to moderate narrowing proximal right subclavian artery. Plaque right carotid bifurcation without measurable stenosis of the proximal right internal carotid artery. Plaque left carotid bifurcation with 34% diameter stenosis proximal left internal carotid artery. Codominant vertebral arteries without significant narrowing. IMPRESSION: MR HEAD Abnormal signal within the left optic nerve, optic chiasm bilaterally greater on the left and along the optic tracts bilaterally greater on the left. This includes restricted motion on diffusion imaging. Although ischemia has been clinically considered as a possible cause of visual loss, the distribution raises possibility of result of demyelinating process (neuromyelitis optica), inflammatory process, infectious process or vasculitis as a cause of the optic nerve/chiasm/ track abnormality. Tumor is a less likely consideration. Prior to considering contrast enhanced orbital MR, please see above discussion. MR CIRCLE OF WILLIS Nonvisualized right posterior inferior cerebellar artery and left anterior inferior cerebellar artery. Mild irregularity and narrowing of portions of the superior cerebellar arteries and distal posterior cerebral artery branches bilaterally. Mild to moderate narrowing distal M1 segment left middle cerebral artery. Otherwise anterior circulation without medium or large size vessel significant stenosis or occlusion. Branch vessel irregularity. MRA NECK Mild to moderate narrowing proximal right subclavian artery. Plaque right carotid  bifurcation without measurable stenosis of the proximal right internal carotid artery. Plaque left carotid bifurcation with 34% diameter stenosis proximal left internal carotid artery. Codominant vertebral arteries without significant narrowing. These results will be called to the ordering clinician or representative by the Radiologist Assistant, and communication documented in the PACS or zVision Dashboard. Electronically Signed   By: Lacy Duverney M.D.   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 Right visual loss occurring over the past couple weeks. Subsequent encounter. EXAM: MR HEAD WITHOUT CONTRAST MR CIRCLE OF WILLIS WITHOUT CONTRAST MRA OF THE NECK WITHOUT AND WITH CONTRAST TECHNIQUE: Multiplanar, multiecho pulse sequences of the brain and surrounding structures were obtained according to standard protocol without intravenous contrast.; Angiographic images of the Circle of Willis were obtained using MRA technique without intravenous contrast.; Multiplanar and multiecho pulse sequences of the neck were obtained without and with intravenous  contrast. Angiographic images of the neck were obtained using MRA technique without and with intravenous contast. CONTRAST:  9 cc MultiHance. Half dose contrast utilized secondary to GFR of 39. COMPARISON:  09/27/2015 CT.  No comparison MR. FINDINGS: MR HEAD FINDINGS Abnormal signal within the left optic nerve, optic chiasm bilaterally greater on the left and along the optic tracts bilaterally greater on the left. This includes restricted motion on diffusion imaging. Although ischemia has been clinically considered as a possible cause of visual loss, the distribution raises possibility of result of demyelinating process (neuromyelitis optica), inflammatory process, infectious process or vasculitis as a cause of the optic  nerve/chiasm/ track abnormality. Tumor is a less likely consideration. The present brain MR was performed without contrast. Patient received contrast for MR angiogram of the neck. Patient has a low GFR of 39 and half dose contrast was utilized. If dedicated contrast enhanced orbital MR were to be obtained, exam should be delayed for at least 48 hours (to avoid multiple dosing of gadolinium in a patient with a low GFR secondary to possibility of developing nephrogenic systemic fibrosis) and patient's GFR should be rechecked prior to administration contrast (contact neuro radiologist if there is any question). Minimal exophthalmos. Symmetric normal appearance of the extra-ocular muscles. Mild to moderate nonspecific periventricular and subcortical white matter changes. Typically in a hypertensive patient of this age, these white matter changes are felt to be related to result of chronic microvascular changes. Tiny area of altered signal intensity deep dependent aspect of the left lateral ventricle without fluid level may represent a tiny calcification rather intracranial hemorrhage. Overall no intracranial hemorrhage noted. 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 Global atrophy without hydrocephalus. Minimal mucosal thickening ethmoid sinus air cells. MR CIRCLE OF WILLIS FINDINGS Artifact extends through the vertebral arteries which are patent on MR angiogram of the neck. Nonvisualized right posterior inferior cerebellar artery and left anterior inferior cerebellar artery. No significant stenosis of the basilar artery. Mild irregularity and narrowing of portions of the superior cerebellar arteries and distal posterior cerebral artery branches bilaterally. Mild to moderate narrowing distal M1 segment left middle cerebral artery. Otherwise anterior circulation without medium or large size vessel significant stenosis or occlusion. Branch vessel irregularity. No  aneurysm noted. MRA NECK FINDINGS Three vessel arch. Mild to moderate narrowing proximal right subclavian artery. Plaque right carotid bifurcation without measurable stenosis of the proximal right internal carotid artery. Plaque left carotid bifurcation with 34% diameter stenosis proximal left internal carotid artery. Codominant vertebral arteries without significant narrowing. IMPRESSION: MR HEAD Abnormal signal within the left optic nerve, optic chiasm bilaterally greater on the left and along the optic tracts bilaterally greater on the left. This includes restricted motion on diffusion imaging. Although ischemia has been clinically considered as a possible cause of visual loss, the distribution raises possibility of result of demyelinating process (neuromyelitis optica), inflammatory process, infectious process or vasculitis as a cause of the optic nerve/chiasm/ track abnormality. Tumor is a less likely consideration. Prior to considering contrast enhanced orbital MR, please see above discussion. MR CIRCLE OF WILLIS Nonvisualized right posterior inferior cerebellar artery and left anterior inferior cerebellar artery. Mild irregularity and narrowing of portions of the superior cerebellar arteries and distal posterior cerebral artery branches bilaterally. Mild to moderate narrowing distal M1 segment left middle cerebral artery. Otherwise anterior circulation without medium or large size vessel significant stenosis or occlusion. Branch vessel irregularity. MRA NECK Mild to moderate narrowing proximal right subclavian artery. Plaque  right carotid bifurcation without measurable stenosis of the proximal right internal carotid artery. Plaque left carotid bifurcation with 34% diameter stenosis proximal left internal carotid artery. Codominant vertebral arteries without significant narrowing. These results will be called to the ordering clinician or representative by the Radiologist Assistant, and communication documented  in the PACS or zVision Dashboard. Electronically Signed   By: Lacy DuverneySteven  Olson M.D.   On: 09/28/2015 12:46    Medications:  I have reviewed the patient's current medications. Scheduled: . amLODipine  10 mg Oral Daily  . digoxin  0.125 mg Oral Daily  . methylPREDNISolone (SOLU-MEDROL) injection  250 mg Intravenous Q6H  . warfarin  5 mg Oral q1800  . Warfarin - Pharmacist Dosing Inpatient   Does not apply q1800    Assessment/Plan: No significant improvement in vision.  Patient s/p 8 of 12 Solumedrol infusions.  Tolerating well.  Would continue for entire 12 doses.     LOS: 3 days   Thana FarrLeslie Warrick Llera, MD Neurology [phone] 09/30/2015  10:02 AM

## 2015-10-01 ENCOUNTER — Inpatient Hospital Stay (HOSPITAL_COMMUNITY): Payer: Medicare Other

## 2015-10-01 ENCOUNTER — Inpatient Hospital Stay (HOSPITAL_COMMUNITY)
Admission: AD | Admit: 2015-10-01 | Discharge: 2015-10-15 | DRG: 059 | Disposition: A | Discharge status: Home Health | Payer: Medicare Other | Source: Other Acute Inpatient Hospital | Attending: Internal Medicine | Admitting: Internal Medicine

## 2015-10-01 DIAGNOSIS — Z7901 Long term (current) use of anticoagulants: Secondary | ICD-10-CM

## 2015-10-01 DIAGNOSIS — E876 Hypokalemia: Secondary | ICD-10-CM | POA: Diagnosis not present

## 2015-10-01 DIAGNOSIS — H3412 Central retinal artery occlusion, left eye: Secondary | ICD-10-CM | POA: Diagnosis not present

## 2015-10-01 DIAGNOSIS — N182 Chronic kidney disease, stage 2 (mild): Secondary | ICD-10-CM | POA: Diagnosis present

## 2015-10-01 DIAGNOSIS — Z87891 Personal history of nicotine dependence: Secondary | ICD-10-CM | POA: Diagnosis not present

## 2015-10-01 DIAGNOSIS — K5900 Constipation, unspecified: Secondary | ICD-10-CM | POA: Diagnosis not present

## 2015-10-01 DIAGNOSIS — I5032 Chronic diastolic (congestive) heart failure: Secondary | ICD-10-CM | POA: Diagnosis present

## 2015-10-01 DIAGNOSIS — I130 Hypertensive heart and chronic kidney disease with heart failure and stage 1 through stage 4 chronic kidney disease, or unspecified chronic kidney disease: Secondary | ICD-10-CM | POA: Diagnosis not present

## 2015-10-01 DIAGNOSIS — G360 Neuromyelitis optica [Devic]: Secondary | ICD-10-CM | POA: Diagnosis present

## 2015-10-01 DIAGNOSIS — B370 Candidal stomatitis: Secondary | ICD-10-CM | POA: Diagnosis present

## 2015-10-01 DIAGNOSIS — H3410 Central retinal artery occlusion, unspecified eye: Secondary | ICD-10-CM | POA: Diagnosis not present

## 2015-10-01 DIAGNOSIS — N400 Enlarged prostate without lower urinary tract symptoms: Secondary | ICD-10-CM | POA: Diagnosis present

## 2015-10-01 DIAGNOSIS — I482 Chronic atrial fibrillation, unspecified: Secondary | ICD-10-CM | POA: Diagnosis present

## 2015-10-01 DIAGNOSIS — H5710 Ocular pain, unspecified eye: Secondary | ICD-10-CM | POA: Diagnosis not present

## 2015-10-01 DIAGNOSIS — H5711 Ocular pain, right eye: Secondary | ICD-10-CM

## 2015-10-01 DIAGNOSIS — I10 Essential (primary) hypertension: Secondary | ICD-10-CM | POA: Diagnosis not present

## 2015-10-01 DIAGNOSIS — H469 Unspecified optic neuritis: Secondary | ICD-10-CM | POA: Diagnosis present

## 2015-10-01 DIAGNOSIS — Z452 Encounter for adjustment and management of vascular access device: Secondary | ICD-10-CM | POA: Diagnosis not present

## 2015-10-01 DIAGNOSIS — R739 Hyperglycemia, unspecified: Secondary | ICD-10-CM | POA: Diagnosis not present

## 2015-10-01 DIAGNOSIS — M1990 Unspecified osteoarthritis, unspecified site: Secondary | ICD-10-CM | POA: Diagnosis present

## 2015-10-01 DIAGNOSIS — E785 Hyperlipidemia, unspecified: Secondary | ICD-10-CM | POA: Diagnosis not present

## 2015-10-01 LAB — PROTIME-INR
INR: 1.66
Prothrombin Time: 19.6 seconds — ABNORMAL HIGH (ref 11.4–15.0)

## 2015-10-01 LAB — T3: T3, Total: 80 ng/dL (ref 71–180)

## 2015-10-01 MED ORDER — POLYETHYLENE GLYCOL 3350 17 G PO PACK
17.0000 g | PACK | Freq: Every day | ORAL | Status: DC
  Administered 2015-10-01: 13:00:00 17 g via ORAL
  Filled 2015-10-01: qty 1

## 2015-10-01 MED ORDER — WARFARIN SODIUM 6 MG PO TABS: 6.0000 mg | ORAL_TABLET | Freq: Every day | ORAL | Status: DC

## 2015-10-01 MED ORDER — ONDANSETRON HCL 4 MG PO TABS: 4.0000 mg | ORAL_TABLET | Freq: Four times a day (QID) | ORAL | Status: DC | PRN

## 2015-10-01 MED ORDER — WARFARIN SODIUM 6 MG PO TABS
6.0000 mg | ORAL_TABLET | Freq: Once | ORAL | Status: AC
  Administered 2015-10-01: 6 mg via ORAL
  Filled 2015-10-01: qty 1

## 2015-10-01 MED ORDER — DIGOXIN 125 MCG PO TABS
125.0000 ug | ORAL_TABLET | Freq: Every day | ORAL | Status: DC
  Administered 2015-10-02 – 2015-10-15 (×14): 125 ug via ORAL
  Filled 2015-10-01 (×14): qty 1

## 2015-10-01 MED ORDER — BISACODYL 10 MG RE SUPP
10.0000 mg | Freq: Every day | RECTAL | Status: DC | PRN
  Administered 2015-10-03 – 2015-10-15 (×3): 10 mg via RECTAL
  Filled 2015-10-01 (×3): qty 1

## 2015-10-01 MED ORDER — SODIUM CHLORIDE 0.9 % IV SOLN
500.0000 mg | Freq: Two times a day (BID) | INTRAVENOUS | Status: AC
  Administered 2015-10-02 – 2015-10-03 (×3): 500 mg via INTRAVENOUS
  Filled 2015-10-01 (×4): qty 4

## 2015-10-01 MED ORDER — SENNOSIDES-DOCUSATE SODIUM 8.6-50 MG PO TABS
1.0000 | ORAL_TABLET | Freq: Every evening | ORAL | Status: DC | PRN
  Administered 2015-10-03 – 2015-10-08 (×2): 1 via ORAL
  Filled 2015-10-01 (×2): qty 1

## 2015-10-01 MED ORDER — TRAZODONE HCL 50 MG PO TABS
25.0000 mg | ORAL_TABLET | Freq: Every evening | ORAL | Status: DC | PRN
  Administered 2015-10-02 – 2015-10-14 (×9): 25 mg via ORAL
  Filled 2015-10-01 (×9): qty 1

## 2015-10-01 MED ORDER — HYDROCODONE-ACETAMINOPHEN 5-325 MG PO TABS
1.0000 | ORAL_TABLET | ORAL | Status: DC | PRN
  Administered 2015-10-05 – 2015-10-06 (×2): 2 via ORAL
  Administered 2015-10-06: 1 via ORAL
  Administered 2015-10-08 – 2015-10-12 (×7): 2 via ORAL
  Filled 2015-10-01 (×10): qty 2

## 2015-10-01 MED ORDER — WARFARIN SODIUM 4 MG PO TABS: 6.0000 mg | ORAL_TABLET | Freq: Once | ORAL | Status: DC

## 2015-10-01 MED ORDER — MAGNESIUM CITRATE PO SOLN
1.0000 | Freq: Once | ORAL | Status: AC | PRN
  Administered 2015-10-02: 1 via ORAL
  Filled 2015-10-01: qty 296

## 2015-10-01 MED ORDER — SODIUM CHLORIDE 0.9 % IV SOLN
INTRAVENOUS | Status: DC
  Administered 2015-10-01 – 2015-10-03 (×3): via INTRAVENOUS

## 2015-10-01 MED ORDER — HEPARIN SODIUM (PORCINE) 1000 UNIT/ML IJ SOLN
3.0000 mL | Freq: Once | INTRAMUSCULAR | Status: DC
  Filled 2015-10-01: qty 3

## 2015-10-01 MED ORDER — ACETAMINOPHEN 325 MG PO TABS
650.0000 mg | ORAL_TABLET | Freq: Four times a day (QID) | ORAL | Status: DC | PRN
  Administered 2015-10-01: 650 mg via ORAL
  Filled 2015-10-01: qty 2

## 2015-10-01 MED ORDER — ONDANSETRON HCL 4 MG/2ML IJ SOLN
4.0000 mg | Freq: Four times a day (QID) | INTRAMUSCULAR | Status: DC | PRN
  Administered 2015-10-06 – 2015-10-13 (×2): 4 mg via INTRAVENOUS
  Filled 2015-10-01 (×2): qty 2

## 2015-10-01 MED ORDER — WARFARIN - PHARMACIST DOSING INPATIENT: Freq: Every day | Status: DC

## 2015-10-01 MED ORDER — HYDROCHLOROTHIAZIDE 25 MG PO TABS
25.0000 mg | ORAL_TABLET | Freq: Two times a day (BID) | ORAL | Status: DC
  Administered 2015-10-01 – 2015-10-03 (×4): 25 mg via ORAL
  Filled 2015-10-01 (×4): qty 1

## 2015-10-01 MED ORDER — HEPARIN SODIUM (PORCINE) 1000 UNIT/ML IJ SOLN
3000.0000 [IU] | Freq: Once | INTRAMUSCULAR | Status: DC
  Filled 2015-10-01: qty 3

## 2015-10-01 MED ORDER — ACETAMINOPHEN 650 MG RE SUPP: 650.0000 mg | Freq: Four times a day (QID) | RECTAL | Status: DC | PRN

## 2015-10-01 MED ORDER — AMLODIPINE BESYLATE 10 MG PO TABS
10.0000 mg | ORAL_TABLET | Freq: Two times a day (BID) | ORAL | Status: DC
  Administered 2015-10-01 – 2015-10-15 (×28): 10 mg via ORAL
  Filled 2015-10-01 (×28): qty 1

## 2015-10-01 NOTE — Progress Notes (Signed)
Report given to [facility]. Bo McclintockBrewer,Garald Rhew S, RN

## 2015-10-01 NOTE — Progress Notes (Signed)
[facility] Awaiting [facility]. Bo McclintockBrewer,Annick Dimaio S, RN

## 2015-10-01 NOTE — Consult Note (Signed)
NEURO HOSPITALIST CONSULT NOTE   Requestig physician: Dr. Konrad DoloresMerrell   Reason for Consult: Bilateral optic neuritis   HPI:                                                                                                                                          Luis Byrd is an 75 y.o. male who presented to [facility] on 09-28-2015 with Progressing right eye vision loss and was consulted by Neurology at that time. 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 Had an outpatient evaluation at that time that he reports included an echocardiogram and carotid doppler that were unremarkable. Over the last couple of weeks he described progressive vision loss in his right eye. He describes this as loss of his right eye peripheral vision. Her last couple days he's also had some dizziness. 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 No vision in temporal visual field of right eye. 20/50 vision in medial vision with glasses on. MRI was obtained and reviewed by Dr. Thad Rangereynolds and was concerning for inflammatory process. Patient was started on Solumedrol 250 mg Q 6 hours IV. Dr. 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 No evidence of CRAO in the right eye. IV solumedrol was continued but patient remained blind in left eye and to have a temporal visual field cut in right eye. Although there was some possible improvement noted today in visual fields. no significant improvement noted despite completing course of Solumedrol. For this reason she was transfered [facility] for [facility]maphores[facility].    Past Medical History  Diagnosis Date  . Hypertension   . Arthritis   . Chronic a-fib (HCC)   . CHF (congestive heart failure)  (HCC)   . HLD (hyperlipidemia)   . BPH (benign prostatic hyperplasia)     Past Surgical History  Procedure Laterality Date  . Hernia repair    . Knee surgery      Family History  Problem Relation Age of Onset  . Heart attack Father   . Cancer Mother     Social History:  reports that he has quit smoking. He does not have any smokeless tobacco history on file. He reports that he does not drink alcohol or use illicit drugs.  No Known Allergies  MEDICATIONS:  Prior to Admission:  Prescriptions prior to admission  Medication Sig Dispense Refill Last Dose  . amLODipine (NORVASC) 10 MG tablet Take 10 mg by mouth 2 (two) times daily.   09/27/2015 at Unknown time  . DIGOX 125 MCG tablet Take 125 mcg by mouth daily.   09/27/2015 at Unknown time  . docusate sodium (COLACE) 50 MG capsule Take 150 mg by mouth at bedtime.    09/26/2015 at Unknown time  . Glucosamine-Chondroitin 250-200 MG TABS Take 2 tablets by mouth daily.   09/27/2015 at Unknown time  . hydrochlorothiazide (HYDRODIURIL) 25 MG tablet Take 25 mg by mouth 2 (two) times daily.   09/27/2015 at Unknown time  . OVER THE COUNTER MEDICATION Take 2 tablets by mouth daily. Jonette Eva* for prostate health   09/27/2015 at Unknown time  . oxyCODONE-acetaminophen (PERCOCET/ROXICET) 5-325 MG tablet Take 1 tablet by mouth every 6 (six) hours as needed. For pain.   09/27/2015 at Unknown time  . potassium chloride SA (K-DUR,KLOR-CON) 20 MEQ tablet Take 40 mEq by mouth 2 (two) times daily.   09/27/2015 at Unknown time  . predniSONE (DELTASONE) 10 MG tablet Take 10 mg by mouth daily.   09/27/2015 at Unknown time  . warfarin (COUMADIN) 2 MG tablet Take 6 mg by mouth daily.   09/26/2015 at Unknown time  . Zinc 50 MG TABS Take 50 mg by mouth every morning.   09/27/2015 at Unknown time   Scheduled:   ROS:                                                                                                                                        History obtained from the patient  General ROS: negative for - chills, fatigue, fever, night sweats, weight gain or weight loss Psychological ROS: negative for - behavioral disorder, hallucinations, memory difficulties, mood swings or suicidal ideation Ophthalmic ROS: negative for - blurry vision, double vision, eye pain or loss of vision ENT ROS: negative for - epistaxis, nasal discharge, oral lesions, sore throat, tinnitus or vertigo Allergy and Immunology ROS: negative for - hives or itchy/watery eyes Hematological and Lymphatic ROS: negative for - bleeding problems, bruising or swollen lymph nodes Endocrine ROS: negative for - galactorrhea, hair pattern changes, polydipsia/polyuria or temperature intolerance Respiratory ROS: negative for - cough, hemoptysis, shortness of breath or wheezing Cardiovascular ROS: negative for - chest pain, dyspnea on exertion, edema or irregular heartbeat Gastrointestinal ROS: negative for - abdominal pain, diarrhea, hematemesis, nausea/vomiting or stool incontinence Genito-Urinary ROS: negative for - dysuria, hematuria, incontinence or urinary frequency/urgency Musculoskeletal ROS: negative for - joint swelling or muscular weakness Neurological ROS: as noted in HPI Dermatological ROS: negative for rash and skin lesion changes   Blood pressure 145/76, pulse 86, temperature 97.6 F (36.4 C), temperature source Oral, resp. rate 20, SpO2 99 %.   Neurologic Examination:  HEENT-  Normocephalic, no lesions, without obvious abnormality.  Normal external eye and conjunctiva.  Normal TM's bilaterally.  Normal auditory canals and external ears. Normal external nose, mucus membranes and septum.  Normal pharynx. Cardiovascular- irregularly irregular rhythm, pulses palpable throughout    Lungs- chest clear, no wheezing, rales, normal symmetric air entry Abdomen- normal findings: bowel sounds normal Extremities- no edema Lymph-no adenopathy palpable Musculoskeletal-no joint tenderness, deformity or swelling Skin-warm and dry, no hyperpigmentation, vitiligo, or suspicious lesions  Neurological Examination Mental Status: Alert, oriented, thought content appropriate.  Speech fluent without evidence of aphasia.  Able to follow 3 step commands without difficulty. Cranial Nerves: II: blind in left eye, 20/100 in right eye with temporal field cut. APD noted in left eye and reactive in right eye III,IV, VI: ptosis  Present left eye, extra-ocular motions intact bilaterally V,VII: smile symmetric, facial light touch sensation normal bilaterally VIII: hearing normal bilaterally IX,X: uvula rises symmetrically XI: bilateral shoulder shrug XII: midline tongue extension Motor: Right : Upper extremity   5/5    Left:     Upper extremity   5/5  Lower extremity   5/5     Lower extremity   5/5 Tone and bulk:normal tone throughout; no atrophy noted Sensory: Pinprick and light touch intact throughout, bilaterally Deep Tendon Reflexes: 2+ and symmetric throughout UE no KJ or AJ Plantars: Right: downgoing   Left: downgoing Cerebellar: normal finger-to-nose and normal heel-to-shin test Gait: not tested      Lab Results: Basic Metabolic Panel:  Recent Labs Lab 09/27/15 1858 09/28/15 0922 09/28/15 1446 09/28/15 1816 09/29/15 0420  NA 140 138  --   --  137  K 3.2* 2.8*  --  3.9 3.8  CL 100* 98*  --   --  100*  CO2 30 32  --   --  25  GLUCOSE 149* 132*  --   --  166*  BUN 27* 25*  --   --  28*  CREATININE 1.66* 1.42*  --   --  1.25*  CALCIUM 9.7 9.2  --   --  8.7*  MG  --   --  1.8  --  1.8    Liver Function Tests: No results for input(s): AST, ALT, ALKPHOS, BILITOT, PROT, ALBUMIN in the last 168 hours. No results for input(s): LIPASE, AMYLASE in the last 168 hours. No  results for input(s): AMMONIA in the last 168 hours.  CBC:  Recent Labs Lab 09/27/15 1858  WBC 5.7  NEUTROABS 4.2  HGB 14.5  HCT 43.6  MCV 87.4  PLT 142*    Cardiac Enzymes: No results for input(s): CKTOTAL, CKMB, CKMBINDEX, TROPONINI in the last 168 hours.  Lipid Panel:  Recent Labs Lab 09/28/15 0608  CHOL 186  TRIG 201*  HDL 26*  CHOLHDL 7.2  VLDL 40  LDLCALC 409*    CBG:  Recent Labs Lab 09/28/15 0745  GLUCAP 109*    Microbiology: Results for orders placed or performed in visit on 01/02/13  Urine culture     Status: None   Collection Time: 01/02/13 10:14 AM  Result Value Ref Range Status   Micro Text Report   Final       SOURCE: CLEA CATCH    ORGANISM 1                1000 CFU/ML GRAM NEGATIVE ROD   ANTIBIOTIC  MRSA PCR Screening     Status: None   Collection Time: 01/02/13 10:25 AM  Result Value Ref Range Status   Micro Text Report   Final       COMMENT                   NEGATIVE - MRSA target DNA not detected   ANTIBIOTIC                                                        Coagulation Studies:  Recent Labs  09/29/15 0420 09/30/15 0548 10/01/15 0428  LABPROT 22.5* 18.2* 19.6*  INR 1.99 1.50 1.66    Imaging: No results found.     Assessment and plan per attending neurologist  Felicie Morn PA-C [facility] [phone]  10/01/2015, 2:53 PM  When I asked Mister Dewitt about the loss of vision in his left eye, he is very clear that it was progressive over several days followed by an abrupt complete loss of vision. It was associated with pain, worsened with eye movement. He has not had as much pain in his right eye, but is feeling like it is progressively worsening as well. 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 I wonder if the edema associated with the optic neuritis could have caused the occlusion of the artery. 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 The fact that he has had some improvement on IV Solu-Medrol does  support some inflammatory component.  [facility] and I do feel that at this time with bilateral optic nerve involvement and some improvement with steroids (though incomplete) that plasma exchange would be a reasonable next step  therefore I ordered this.  1) continue Solu-Medrol for total 5 days treatment(4 doses of 500 mg every 12 hours) 2) plasma exchange 3) MRI orbits with and without contrast 4) neurology will follow.  Ritta Slot, MD [facility] [phone]  If 7pm- 7am, please page neurology on call as listed in [facility].

## 2015-10-01 NOTE — Care Management Note (Signed)
Case Management Note  Patient Details  Name: Luis RamsayLorin B Byrd MRN: [id number] Date of Birth: 1940-12-21  Subjective/Objective:                    Action/Plan: [facility] where he presented with loss of his right eye peripheral vision and dizziness. While there, he received multiple doses of IV Solumedrol without improvement. Patient was [facility]map[facility].  CM will follow for discharge needs pending patient's progress and physician orders.  Expected Discharge Date:                  Expected Discharge Plan:     In-House Referral:     Discharge planning Services     Post Acute Care Choice:    Choice offered to:     DME Arranged:    DME Agency:     HH Arranged:    HH Agency:     Status of Service:  In process, will continue to follow  Medicare Important Message Given:    Date Medicare IM Given:    Medicare IM give by:    Date Additional Medicare IM Given:    Additional Medicare Important Message give by:     If discussed at Long Length of Stay Meetings, dates discussed:    Additional CommentsAnda Kraft:  Ishitha Roper C, RN 10/01/2015, 3:09 PM [phone]

## 2015-10-01 NOTE — Progress Notes (Signed)
PT Cancellation Note  Patient Details Name: Luis RamsayLorin B Byrd MRN: [id number] DOB: [birth year]   Cancelled Treatment:    Reason Eval/Treat Not Completed: Patient at procedure or test/unavailable MD evaluating patient. Will follow up.   Blake DivineShauna A Desarie Feild 10/01/2015, 4:26 PM  Mylo RedShauna Isaih Bulger, PT, DPT [phone]

## 2015-10-01 NOTE — Progress Notes (Signed)
ANTICOAGULATION CONSULT NOTE - Follow Up Consult  Pharmacy Consult for Warfarin dosing Indication: atrial fibrillation  No Known Allergies  Patient Measurements: Height: 5\' 9"  (175.3 cm) Weight: 190 lb (86.183 kg) IBW/kg (Calculated) : 70.7  Vital Signs: Temp: 97.5 F (36.4 C) (05/10 0454) Temp Source: Oral (05/10 0454) BP: 129/73 mmHg (05/10 0454) Pulse Rate: 96 (05/10 0454)  Labs:  Recent Labs  09/28/15 0922 09/29/15 0420 09/30/15 0548 10/01/15 0428  LABPROT 32.0* 22.5* 18.2* 19.6*  INR 3.18 1.99 1.50 1.66  CREATININE 1.42* 1.25*  --   --    Estimated Creatinine Clearance: 54.7 mL/min (by C-G formula based on Cr of 1.25).  Medications:  Prescriptions prior to admission  Medication Sig Dispense Refill Last Dose  . amLODipine (NORVASC) 10 MG tablet Take 10 mg by mouth 2 (two) times daily.   09/27/2015 at Unknown time  . DIGOX 125 MCG tablet Take 125 mcg by mouth daily.   09/27/2015 at Unknown time  . docusate sodium (COLACE) 50 MG capsule Take 150 mg by mouth at bedtime.    09/26/2015 at Unknown time  . Glucosamine-Chondroitin 250-200 MG TABS Take 2 tablets by mouth daily.   09/27/2015 at Unknown time  . hydrochlorothiazide (HYDRODIURIL) 25 MG tablet Take 25 mg by mouth 2 (two) times daily.   09/27/2015 at Unknown time  . OVER THE COUNTER MEDICATION Take 2 tablets by mouth daily. Jonette Eva*Antiiva* for prostate health   09/27/2015 at Unknown time  . oxyCODONE-acetaminophen (PERCOCET/ROXICET) 5-325 MG tablet Take 1 tablet by mouth every 6 (six) hours as needed. For pain.   09/27/2015 at Unknown time  . potassium chloride SA (K-DUR,KLOR-CON) 20 MEQ tablet Take 40 mEq by mouth 2 (two) times daily.   09/27/2015 at Unknown time  . predniSONE (DELTASONE) 10 MG tablet Take 10 mg by mouth daily.   09/27/2015 at Unknown time  . warfarin (COUMADIN) 2 MG tablet Take 6 mg by mouth daily.   09/26/2015 at Unknown time  . Zinc 50 MG TABS Take 50 mg by mouth every morning.   09/27/2015 at Unknown time     Assessment: Patient is a 75yo male admitted with loss of vision. Patient was taking Coumadin 6mg  daily prior to admission for afib. INR was supratherapeutic on admission, appears that last dose of Coumadin was on 09/26/15.  Dosing history: 5/6 INR: 3.36, no warfarin 5/7 INR: 3.18, no warfarin 5/8 INR: 1.99, warfarin 5 mg 5/9 INR: 1.50, warfarin 6 mg 5/10 INR: 1.66;   INR remains subtherapeutic today @ 1.66 due to dose being held for 3 days, but has increased from yesterday.   Goal of Therapy:  INR 2-3  Plan:  Will continue with warfarin 6mg  for this evening at 1800 and check daily INRs. CBC ordered with AM labs tomorrow.   Thank you for allowing pharmacy to be part of this patient's care.  Cindi CarbonMary M Phillips Goulette, PharmD 10/01/2015 7:45 AM

## 2015-10-01 NOTE — Evaluation (Signed)
Physical Therapy Evaluation Patient Details Name: Luis Byrd MRN: [id number] DOB: 01-20-1941 Today's Date: 10/01/2015   History of Present Illness  Patient is a 75 y/o male that presents with recent history of L central retinal occlusion (2-3 weeks ago) and new onset R repipheral vision loss. MRI concerning for inflammatory process, Abnormal signal within the left optic nerve, optic chiasm bilaterally greater on the left and along the optic tracts bilaterally greater on the left. 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 Transferred to [facility] for plasmaphoresis.  Clinical Impression  Patient presents with impaired mobility secondary to visual deficits - blindness in left eye and partial vision loss in right eye. Reports seeing things that are not there affecting balance. Requires Mod A for mobility due to verbal input and directional cues to compensate for vision. Pt independent PTA caring for wife with dementia at home. Will need to await hospital course to determine safe discharge plan. Pt has supportive family. Will follow acutely to maximize independence and mobility and learn compensatory strategies for home.     Follow Up Recommendations Home health PT;Supervision/Assistance - 24 hour (pending improvement)    Equipment Recommendations  None recommended by PT    Recommendations for Other Services OT consult     Precautions / Restrictions Precautions Precautions: Fall Precaution Comments: blind in left eye, No vision in temporal visual field of right eye. Restrictions Weight Bearing Restrictions: No      Mobility  Bed Mobility Overal bed mobility: Independent             General bed mobility comments: No deficits in bed mobility.   Transfers Overall transfer level: Needs assistance Equipment used: None Transfers: Sit to/from Stand Sit to Stand: Min guard         General transfer comment: Min guard for safety due to visual deficits.    Ambulation/Gait Ambulation/Gait assistance: Min assist Ambulation Distance (Feet): 200 Feet Assistive device: 1 person hand held assist Gait Pattern/deviations: Step-through pattern;Decreased stride length;Shuffle Gait velocity: reduced   General Gait Details: Slow, shuffling steps using UEs to reach for things in environment. Max directional cues due to visual deficits.   Stairs            Wheelchair Mobility    Modified Rankin (Stroke Patients Only)       Balance Overall balance assessment: Needs assistance Sitting-balance support: Feet supported;No upper extremity supported Sitting balance-Leahy Scale: Normal     Standing balance support: During functional activity Standing balance-Leahy Scale: Good Standing balance comment: Increased need for verbal input due to visual deficits                             Pertinent Vitals/Pain Pain Assessment: No/denies pain    Home Living Family/patient expects to be discharged to:: Private residence Living Arrangements: Spouse/significant other Available Help at Discharge: Family Type of Home: House Home Access: Stairs to enter Entrance Stairs-Rails: Left Entrance Stairs-Number of Steps: 3 Home Layout: One level Home Equipment: Walker - 2 wheels;Cane - single point;Shower seat      Prior Function Level of Independence: Independent         Comments: Patient reports no falls, no recent use of AD. Helps care for wife who has dementia.     Hand Dominance        Extremity/Trunk Assessment   Upper Extremity Assessment: Defer to OT evaluation           Lower  Extremity Assessment: Overall WFL for tasks assessed      Cervical / Trunk Assessment: Normal  Communication   Communication: No difficulties  Cognition Arousal/Alertness: Awake/alert Behavior During Therapy: WFL for tasks assessed/performed Overall Cognitive Status: Within Functional Limits for tasks assessed                       General Comments General comments (skin integrity, edema, etc.): Son present during session.    Exercises        Assessment/Plan    PT Assessment Patient needs continued PT services  PT Diagnosis Difficulty walking   PT Problem List Decreased balance;Decreased mobility;Decreased safety awareness  PT Treatment Interventions Balance training;Gait training;Stair training;Functional mobility training;Therapeutic activities;Therapeutic exercise;Patient/family education;DME instruction   PT Goals (Current goals can be found in the Care Plan section) Acute Rehab PT Goals Patient Stated Goal: to be able to see and take care of my wife PT Goal Formulation: With patient Time For Goal Achievement: 10/15/15 Potential to Achieve Goals: Good    Frequency Min 3X/week   Barriers to discharge Decreased caregiver support Pt is caregiver for wife with dementia    Co-evaluation               End of Session Equipment Utilized During Treatment: Gait belt Activity Tolerance: Patient tolerated treatment well Patient left: in bed;with call bell/phone within reach;with family/visitor present Nurse Communication: Mobility status         Time: [id number] PT Time Calculation (min) (ACUTE ONLY): 17 min   Charges:   PT Evaluation $PT Eval Moderate Complexity: 1 Procedure     PT G Codes:        Luis Byrd 10/01/2015, 4:56 PM  Luis Byrd, PT, DPT [phone]

## 2015-10-01 NOTE — Progress Notes (Signed)
Patient discharged via [facility] due to needing services that are unavailable at this facility. Bo McclintockBrewer,Skylene Deremer S, RN

## 2015-10-01 NOTE — Progress Notes (Signed)
Physical Therapy Treatment Patient Details Name: Luis Byrd MRN: [id number] DOB: 08-Aug-1940 Today's Date: 10/01/2015    History of Present Illness Patient is a 75 y/o male that presents with recent history of L central retinal occlusion (2-3 weeks ago) and new onset R repipheral vision loss. 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     PT Comments    Pt has no voiced complaints; notes he is discharging to [facility] this morning. Pt wishes to walk with PT. Pt requires set up first for use of urinal as well as clean up post and hand hygiene. Pt ambulates with hand held assist today and use of left rail when available for 500 ft. Pt is noted to require increased verbal cues for finding left rail and avoidance of objects on left; pt also taps and moves hand through air much more so today to find and avoid objects. Pt now has current discharge orders.   Follow Up Recommendations  Other (comment) (Pt discharging to [facility] for further treatment)     Equipment Recommendations       Recommendations for Other Services       Precautions / Restrictions Restrictions Weight Bearing Restrictions: No    Mobility  Bed Mobility Overal bed mobility: Independent                Transfers Overall transfer level: Needs assistance Equipment used: None Transfers: Sit to/from Stand Sit to Stand: Supervision;Modified independent (Device/Increase time)         General transfer comment: Use of bed rails to steady self  Ambulation/Gait Ambulation/Gait assistance: Min guard Ambulation Distance (Feet): 500 Feet Assistive device: 1 person hand held assist Gait Pattern/deviations: Step-through pattern;Decreased stride length Gait velocity: reduced Gait velocity interpretation: Below normal speed for age/gender General Gait Details: Refused use of rw. Use of L hand rail. Requires increased cues today to avoid objects. Visual  deficits greater with need for greater assist and reaching/tapping with L hand to find rail or avoid objects.    Stairs            Wheelchair Mobility    Modified Rankin (Stroke Patients Only)       Balance Overall balance assessment: Needs assistance Sitting-balance support: Feet supported Sitting balance-Leahy Scale: Normal     Standing balance support: Single extremity supported Standing balance-Leahy Scale: Good Standing balance comment: Increased need for verbal input due to visual deficits                    Cognition Arousal/Alertness: Awake/alert Behavior During Therapy: WFL for tasks assessed/performed Overall Cognitive Status: Within Functional Limits for tasks assessed                      Exercises Other Exercises Other Exercises: Set up and clean up required for use of urinal and hand hygiene    General Comments        Pertinent Vitals/Pain Pain Assessment: No/denies pain    Home Living                      Prior Function            PT Goals (current goals can now be found in the care plan section) Progress towards PT goals: Progressing toward goals    Frequency  Min 2X/week    PT Plan Discharge plan needs to be updated    Co-evaluation  End of Session Equipment Utilized During Treatment: Gait belt Activity Tolerance: Patient tolerated treatment well Patient left: in bed;with call bell/phone within reach (Refused alarm; will call for A; all items in reach)     Time: 0935-1005 PT Time Calculation (min) (ACUTE ONLY): 30 min  Charges:  $Gait Training: 23-37 mins                    G Codes:      Kristeen MissHeidi Elizabeth Bishop, PTA 10/01/2015, 11:10 AM

## 2015-10-01 NOTE — Progress Notes (Signed)
eLink Physician-Brief Progress Note Patient Name: Luis RamsayLorin B Byrd DOB: 1941/01/06 MRN: [id number]   Date of Service  10/01/2015  HPI/Events of Note  Pt with optic neuritis.  Needs plasma exchange.  Had HD catheter placed earlier today.  I have personally reviewed CXR and catheter is in acceptable position.   eICU Interventions  I have written order stating that catheter is okay to use for plasma exchange.      Intervention Category Major Interventions: Other:  Luis Byrd 10/01/2015, 7:26 PM

## 2015-10-01 NOTE — Care Management Important Message (Signed)
Important Message  Patient Details  Name: Luis RamsayLorin B Hisaw MRN: [id number] Date of Birth: [birth year]   Medicare Important Message Given:  Yes    Olegario MessierKathy A Arnol Mcgibbon 10/01/2015, 9:47 AM

## 2015-10-01 NOTE — Progress Notes (Signed)
Patient being transferred to [facility] from [facility]. Discussed case with Dr. Thana FarrLeslie Reynolds of neurology. Patient admitted on 09/27/2015 for treatment of optic neuritis. Patient has been on high-dose steroids without relief. Case was discussed with neurology, Dr. Amada JupiterKirkpatrick, here at [facility] who recommends transfer to [facility] and initiation of plasmapheresis. Patient has been accepted to medical surgical bed and will be transferred when a bed becomes available per [facility].  Shelly Flattenavid Merrell, MD [facility] 10/01/2015, 10:33 AM

## 2015-10-01 NOTE — Progress Notes (Signed)
Patient arrived to [phone] from [facility] via [facility]. Safety precautions and orders reviewed with patient. TELE applied and confirmed. MD paged per pt arrival. VSS. Pt denied any distress. Will continue to monitor.   Sim BoastHavy, RN

## 2015-10-01 NOTE — Progress Notes (Signed)
Subjective: Patient reports no significant improvement in vision today.  Tolerating Solumedrol.    Objective: Current vital signs: BP 140/76 mmHg  Pulse 85  Temp(Src) 98.3 F (36.8 C) (Oral)  Resp 18  Ht 5\' 9"  (1.753 m)  Wt 86.183 kg (190 lb)  BMI 28.05 kg/m2  SpO2 96% Vital signs in last 24 hours: Temp:  [97.5 F (36.4 C)-98.5 F (36.9 C)] 98.3 F (36.8 C) (05/10 0800) Pulse Rate:  [56-96] 85 (05/10 0935) Resp:  [18-20] 18 (05/10 0800) BP: (122-141)/(65-76) 140/76 mmHg (05/10 0800) SpO2:  [95 %-100 %] 96 % (05/10 0935)  Intake/Output from previous day: 05/09 0701 - 05/10 0700 In: 1048 [P.O.:840; IV Piggyback:208] Out: 1550 [Urine:1550] Intake/Output this shift: Total I/O In: 240 [P.O.:240] Out: 150 [Urine:150] Nutritional status: Diet Heart Room service appropriate?: Yes; Fluid consistency:: Thin  Neurologic Exam: Mental Status: Alert, oriented, thought content appropriate.  Speech fluent without evidence of aphasia.  Able to follow 3 step commands without difficulty. Cranial Nerves: II: Blind in the left eye.  Able to see some fingers right on midline in the right eye particularly in the lower quadrant III,IV, VI: ptosis not present, extra-ocular motions intact bilaterally V,VII: smile symmetric, facial light touch sensation normal bilaterally VIII: hearing normal bilaterally IX,X: gag reflex present XI: bilateral shoulder shrug XII: midline tongue extension Motor: Right : Upper extremity   5/5    Left:     Upper extremity   5/5   Lab Results: Basic Metabolic Panel:  Recent Labs Lab 09/27/15 1858 09/28/15 0922 09/28/15 1446 09/28/15 1816 09/29/15 0420  NA 140 138  --   --  137  K 3.2* 2.8*  --  3.9 3.8  CL 100* 98*  --   --  100*  CO2 30 32  --   --  25  GLUCOSE 149* 132*  --   --  166*  BUN 27* 25*  --   --  28*  CREATININE 1.66* 1.42*  --   --  1.25*  CALCIUM 9.7 9.2  --   --  8.7*  MG  --   --  1.8  --  1.8    Liver Function Tests: No  results for input(s): AST, ALT, ALKPHOS, BILITOT, PROT, ALBUMIN in the last 168 hours. No results for input(s): LIPASE, AMYLASE in the last 168 hours. No results for input(s): AMMONIA in the last 168 hours.  CBC:  Recent Labs Lab 09/27/15 1858  WBC 5.7  NEUTROABS 4.2  HGB 14.5  HCT 43.6  MCV 87.4  PLT 142*    Cardiac Enzymes: No results for input(s): CKTOTAL, CKMB, CKMBINDEX, TROPONINI in the last 168 hours.  Lipid Panel:  Recent Labs Lab 09/28/15 0608  CHOL 186  TRIG 201*  HDL 26*  CHOLHDL 7.2  VLDL 40  LDLCALC 578120*    CBG:  Recent Labs Lab 09/28/15 0745  GLUCAP 109*    Microbiology: Results for orders placed or performed in visit on 01/02/13  Urine culture     Status: None   Collection Time: 01/02/13 10:14 AM  Result Value Ref Range Status   Micro Text Report   Final       SOURCE: CLEA CATCH    ORGANISM 1                1000 CFU/ML GRAM NEGATIVE ROD   ANTIBIOTIC  MRSA PCR Screening     Status: None   Collection Time: 01/02/13 10:25 AM  Result Value Ref Range Status   Micro Text Report   Final       COMMENT                   NEGATIVE - MRSA target DNA not detected   ANTIBIOTIC                                                        Coagulation Studies:  Recent Labs  09/29/15 0420 09/30/15 0548 10/01/15 0428  LABPROT 22.5* 18.2* 19.6*  INR 1.99 1.50 1.66    Imaging: No results found.  Medications:  I have reviewed the patient's current medications. Scheduled: . amLODipine  10 mg Oral Daily  . digoxin  0.125 mg Oral Daily  . methylPREDNISolone (SOLU-MEDROL) injection  250 mg Intravenous Q6H  . warfarin  6 mg Oral ONCE-1800  . Warfarin - Pharmacist Dosing Inpatient   Does not apply q1800    Assessment/Plan: Although some possible improvement noted today in visual fields. no significant improvement noted despite completing course of Solumedrol.    Recommendations: 1.  Plans  made for transfer to [facility] for plasmaphoresis.     LOS: 4 days   Thana Farr, MD Neurology [phone] 10/01/2015  11:10 AM

## 2015-10-01 NOTE — Discharge Summary (Signed)
[facility] [place] [facility]   PATIENT NAME: Luis Byrd    MR#:  [id number]  DATE OF BIRTH:  23-Jul-1940  DATE OF ADMISSION:  09/27/2015 ADMITTING PHYSICIAN: Oralia Manisavid Willis, MD  DATE OF DISCHARGE: 10/01/2015  PRIMARY CARE PHYSICIAN: SPARKS,JEFFREY D, MD    ADMISSION DIAGNOSIS:  Dizziness [R42] Dim vision of right eye [H54.7] Acute nonintractable headache, unspecified headache type [R51]  DISCHARGE DIAGNOSIS:  Principal Problem:   Neuromyelitis optica (NMO) Active Problems:   Retinal artery occlusion, central   Chronic atrial fibrillation (HCC)   HTN (hypertension)   HLD (hyperlipidemia)   SECONDARY DIAGNOSIS:   Past Medical History  Diagnosis Date  . Hypertension   . Arthritis   . Chronic a-fib (HCC)   . CHF (congestive heart failure) (HCC)   . HLD (hyperlipidemia)   . BPH (benign prostatic hyperplasia)     HOSPITAL COURSE:  Luis Byrd  is a 75 y.o. male admitted 09/27/2015 with chief complaint Eye Problem . Please see H&P performed by Oralia Manisavid Willis, MD for further information. He was admitted with the above complain including vision loss. Of note recently had loss of vision in left eye and diagnoses with central retinal occlusion. Originally concerned for stroke like process however MRI performed revealing:  Abnormal signal within the left optic nerve, optic chiasm bilaterally greater on the left and along the optic tracts bilaterally greater on the left. This includes restricted motion on diffusion imaging. Although ischemia has been clinically considered as a possible cause of visual loss, the distribution raises possibility of result of demyelinating process (neuromyelitis optica), inflammatory process, infectious process or vasculitis as a cause of the optic nerve/chiasm/ track abnormality. Tumor is a less likely consideration.  With this finding and with the assistance of neurology the patient was started on high dose solumedrol as a  treatment for NMO. Rheumatology was also consulted for their assistance. Despite receiving 12 doses of 125mg  solumedrol he has yet to have any significant improvement in vision. At the  Recommendation of neurology/rheumatology the patient would likely benefit from plasmaphoresis followed by immunosuppression. He will be transferred to [facility] hospital for continued treatment unavailable at this facility (plasmaphoresis)  DISCHARGE CONDITIONS:   Stable - needing more treatment  CONSULTS OBTAINED:  Treatment Team:  Thana FarrLeslie [facility], MD [place] CraneBradley Mark King, MD Kandyce RudGeorge W Kernodle Jr., MD  DRUG ALLERGIES:  No Known Allergies  DISCHARGE MEDICATIONS:   Current Discharge Medication List    CONTINUE these medications which have NOT CHANGED   Details  amLODipine (NORVASC) 10 MG tablet Take 10 mg by mouth 2 (two) times daily.    DIGOX 125 MCG tablet Take 125 mcg by mouth daily.    docusate sodium (COLACE) 50 MG capsule Take 150 mg by mouth at bedtime.     Glucosamine-Chondroitin 250-200 MG TABS Take 2 tablets by mouth daily.    hydrochlorothiazide (HYDRODIURIL) 25 MG tablet Take 25 mg by mouth 2 (two) times daily.    OVER THE COUNTER MEDICATION Take 2 tablets by mouth daily. Jonette Eva*Antiiva* for prostate health    oxyCODONE-acetaminophen (PERCOCET/ROXICET) 5-325 MG tablet Take 1 tablet by mouth every 6 (six) hours as needed. For pain.    potassium chloride SA (K-DUR,KLOR-CON) 20 MEQ tablet Take 40 mEq by mouth 2 (two) times daily.    predniSONE (DELTASONE) 10 MG tablet Take 10 mg by mouth daily.    warfarin (COUMADIN) 2 MG tablet Take 6 mg by mouth daily.    Zinc 50 MG TABS  Take 50 mg by mouth every morning.         DISCHARGE INSTRUCTIONS:    DIET:  Regular diet  DISCHARGE CONDITION:  Fair  ACTIVITY:  Activity as tolerated  OXYGEN:  Home Oxygen: No.   Oxygen Delivery: room air  DISCHARGE LOCATION:  [facility]   If you experience worsening of your admission symptoms, develop  shortness of breath, life threatening emergency, suicidal or homicidal thoughts you must seek medical attention immediately by calling 911 or calling your MD immediately  if symptoms less severe.  You Must read complete instructions/literature along with all the possible adverse reactions/side effects for all the Medicines you take and that have been prescribed to you. Take any new Medicines after you have completely understood and accpet all the possible adverse reactions/side effects.   Please note  You were cared for by a hospitalist during your hospital stay. If you have any questions about your discharge medications or the care you received while you were in the hospital after you are discharged, you can call the unit and asked to speak with the hospitalist on call if the hospitalist that took care of you is not available. Once you are discharged, your primary care physician will handle any further medical issues. Please note that NO REFILLS for any discharge medications will be authorized once you are discharged, as it is imperative that you return to your primary care physician (or establish a relationship with a primary care physician if you do not have one) for your aftercare needs so that they can reassess your need for medications and monitor your lab values.    On the day of Discharge:   VITAL SIGNS:  Blood pressure 140/76, pulse 83, temperature 98.3 F (36.8 C), temperature source Oral, resp. rate 18, height 5\' 9"  (1.753 m), weight 86.183 kg (190 lb), SpO2 95 %.  I/O:   Intake/Output Summary (Last 24 hours) at 10/01/15 0950 Last data filed at 10/01/15 0900  Gross per 24 hour  Intake    996 ml  Output   1500 ml  Net   -504 ml    PHYSICAL EXAMINATION:  GENERAL:  75 y.o.-year-old patient lying in the bed with no acute distress.  EYES: Pupils equal, round, reactive to light and accommodation. No scleral icterus. Extraocular muscles intact.  HEENT: Head atraumatic,  normocephalic. Oropharynx and nasopharynx clear.  NECK:  Supple, no jugular venous distention. No thyroid enlargement, no tenderness.  LUNGS: Normal breath sounds bilaterally, no wheezing, rales,rhonchi or crepitation. No use of accessory muscles of respiration.  CARDIOVASCULAR: S1, S2 normal. No murmurs, rubs, or gallops.  ABDOMEN: Soft, non-tender, non-distended. Bowel sounds present. No organomegaly or mass.  EXTREMITIES: No pedal edema, cyanosis, or clubbing.  NEUROLOGIC: complete vision loss right eye, blurred vision in left otherwise Cranial nerves II through XII are intact. Muscle strength 5/5 in all extremities. Sensation intact. Gait not checked.  PSYCHIATRIC: The patient is alert and oriented x 3.  SKIN: No obvious rash, lesion, or ulcer.   DATA REVIEW:   CBC  Recent Labs Lab 09/27/15 1858  WBC 5.7  HGB 14.5  HCT 43.6  PLT 142*    Chemistries   Recent Labs Lab 09/29/15 0420  NA 137  K 3.8  CL 100*  CO2 25  GLUCOSE 166*  BUN 28*  CREATININE 1.25*  CALCIUM 8.7*  MG 1.8    Cardiac Enzymes No results for input(s): TROPONINI in the last 168 hours.  Microbiology Results  Results for orders  placed or performed in visit on 01/02/13  Urine culture     Status: None   Collection Time: 01/02/13 10:14 AM  Result Value Ref Range Status   Micro Text Report   Final       SOURCE: CLEA CATCH    ORGANISM 1                1000 CFU/ML GRAM NEGATIVE ROD   ANTIBIOTIC                                                      MRSA PCR Screening     Status: None   Collection Time: 01/02/13 10:25 AM  Result Value Ref Range Status   Micro Text Report   Final       COMMENT                   NEGATIVE - MRSA target DNA not detected   ANTIBIOTIC                                                        RADIOLOGY:  No results found.   Management plans discussed with the patient, family and they are in agreement.  CODE STATUS:     Code Status Orders        Start      Ordered   09/27/15 2338  Full code   Continuous     09/27/15 2337    Code Status History    Date Active Date Inactive Code Status Order ID Comments User Context   This patient has a current code status but no historical code status.      TOTAL TIME TAKING CARE OF THIS PATIENT: 40 minutes.    Hower,  Mardi Mainland.D on 10/01/2015 at 9:50 AM  Between 7am to 6pm - Pager - [phone]  After 6pm go to [email or web address] - Social research officer, government  [employer] [place] [facility]  Office  [phone]  CC: Primary care physician; Marguarite Arbour, MD

## 2015-10-01 NOTE — Care Management (Signed)
Informed that transfer to [facility] has been arranged for services not provided at [facility].  Patient requires plasma phoresis.

## 2015-10-01 NOTE — H&P (Signed)
History and Physical    Luis Byrd WUJ:[id number] DOB: 1940/12/26 DOA: 10/01/2015   PCP: Marguarite Arbour, MD   Patient coming from:  Hospital   Chief Complaint: Optic Neuritis  HPI: Luis Byrd is a 75 y.o. male with medical history significant for HTN, CAF on Coumadin, HLD, CHF, transferred from [facility] for the treatment of optic neuritis resistant to steroid therapy, brought to [facility] for plasmapheresis. In review, he presented on 5/6 with acute vision loss and eye pain, headaches and some dizzines following  left eye central retinal artery occlusion 3 weeks prior. At the time, he had OP evaluation including 2 D echo, and carotid dopplers which were unremarkable. Symptoms were complicated peripheral  right eye vision loss. 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 He was started on Solumedrol 125 mg q 6 hrs, total 12 doses. No significant improvement was noted. Dr. Anise Salvo, Neuro is to continue his care in consultation, which includes plasmapheresis.Denies fevers, chills, night sweats, or mucositis, dysphagia, chest pain or respiratory complaints.  Denies lower extremity swelling. Denies nausea, heartburn or change in bowel habits. Denies abdominal pain. Appetite is normal. Denies any dysuria. Denies abnormal skin rashes, or neuropathy. Denies any bleeding issues such as epistaxis, hematemesis, hematuria or hematochezia.  Denies any recent infections. THis is the first time he experiences these symptoms. No headaches. No seizures. Denies tobacco or ETOH or recreational drugs.   ED Course:  BP 145/76 mmHg  Pulse 86  Temp(Src) 97.6 F (36.4 C) (Oral)  Resp 20  SpO2 99%   Review of Systems: As per HPI otherwise 10 point review of systems negative.   Past Medical History  Diagnosis Date  . Hypertension   . Arthritis   . Chronic a-fib (HCC)   . CHF (congestive heart failure) (HCC)   . HLD  (hyperlipidemia)   . BPH (benign prostatic hyperplasia)     Past Surgical History  Procedure Laterality Date  . Hernia repair    . Knee surgery       reports that he has quit smoking. He does not have any smokeless tobacco history on file. He reports that he does not drink alcohol or use illicit drugs. Walks unassisted  with cane  with walker Patient is on  wheelchair  No Known Allergies  Family History  Problem Relation Age of Onset  . Heart attack Father   . Cancer Mother       Prior to Admission medications   Medication Sig Start Date End Date Taking? Authorizing Provider  amLODipine (NORVASC) 10 MG tablet Take 10 mg by mouth 2 (two) times daily. 07/17/15   Historical Provider, MD  DIGOX 125 MCG tablet Take 125 mcg by mouth daily. 07/17/15   Historical Provider, MD  docusate sodium (COLACE) 50 MG capsule Take 150 mg by mouth at bedtime.     Historical Provider, MD  Glucosamine-Chondroitin 250-200 MG TABS Take 2 tablets by mouth daily.    Historical Provider, MD  hydrochlorothiazide (HYDRODIURIL) 25 MG tablet Take 25 mg by mouth 2 (two) times daily. 07/17/15   Historical Provider, MD  OVER THE COUNTER MEDICATION Take 2 tablets by mouth daily. Jonette Eva* for prostate health    Historical Provider, MD  oxyCODONE-acetaminophen (PERCOCET/ROXICET) 5-325 MG tablet Take 1 tablet by mouth every 6 (six) hours as needed. For pain. 09/24/15   Historical Provider, MD  potassium chloride SA (K-DUR,KLOR-CON) 20 MEQ tablet Take 40 mEq  by mouth 2 (two) times daily. 07/17/15   Historical Provider, MD  predniSONE (DELTASONE) 10 MG tablet Take 10 mg by mouth daily. 07/17/15   Historical Provider, MD  warfarin (COUMADIN) 2 MG tablet Take 6 mg by mouth daily.    Historical Provider, MD  Zinc 50 MG TABS Take 50 mg by mouth every morning.    Historical Provider, MD    Physical Exam:    Filed Vitals:   10/01/15 1434  BP: 145/76  Pulse: 86  Temp: 97.6 F (36.4 C)  TempSrc: Oral  Resp: 20  SpO2: 99%       Constitutional: NAD, calm, comfortable Filed Vitals:   10/01/15 1434  BP: 145/76  Pulse: 86  Temp: 97.6 F (36.4 C)  TempSrc: Oral  Resp: 20  SpO2: 99%   Eyes:  L eye blind, not responsive to light, R sightly more responsive to  Light, lids and conjunctivae normal ENMT: Mucous membranes are moist. Posterior pharynx clear of any exudate or lesions.Normal dentition.  Neck: normal, supple, no masses, no thyromegaly Respiratory: clear to auscultation bilaterally, no wheezing, no crackles. Normal respiratory effort. No accessory muscle use.  Cardiovascular: Regular rate and rhythm, no murmurs / rubs / gallops. No extremity edema. 2+ pedal pulses. No carotid bruits.  Abdomen: no tenderness, no masses palpated. No hepatosplenomegaly. Bowel sounds positive.  Musculoskeletal: no clubbing / cyanosis. No joint deformity upper and lower extremities. Good ROM, no contractures. Normal muscle tone.  Skin: no rashes, lesions, ulcers. No induration. Multiple tattoos Neurologic: Left eye blind, decreased eye sight on the right  Psychiatric: Normal judgment and insight. Alert and oriented x 3. Normal mood.     Labs on Admission: I have personally reviewed following labs and imaging studies  CBC:  Recent Labs Lab 09/27/15 1858  WBC 5.7  NEUTROABS 4.2  HGB 14.5  HCT 43.6  MCV 87.4  PLT 142*    Basic Metabolic Panel:  Recent Labs Lab 09/27/15 1858 09/28/15 0922 09/28/15 1446 09/28/15 1816 09/29/15 0420  NA 140 138  --   --  137  K 3.2* 2.8*  --  3.9 3.8  CL 100* 98*  --   --  100*  CO2 30 32  --   --  25  GLUCOSE 149* 132*  --   --  166*  BUN 27* 25*  --   --  28*  CREATININE 1.66* 1.42*  --   --  1.25*  CALCIUM 9.7 9.2  --   --  8.7*  MG  --   --  1.8  --  1.8    GFR: Estimated Creatinine Clearance: 54.7 mL/min (by C-G formula based on Cr of 1.25).  Liver Function Tests: No results for input(s): AST, ALT, ALKPHOS, BILITOT, PROT, ALBUMIN in the last 168 hours. No  results for input(s): LIPASE, AMYLASE in the last 168 hours. No results for input(s): AMMONIA in the last 168 hours.  Coagulation Profile:  Recent Labs Lab 09/27/15 1858 09/28/15 0922 09/29/15 0420 09/30/15 0548 10/01/15 0428  INR 3.36 3.18 1.99 1.50 1.66    Cardiac Enzymes: No results for input(s): CKTOTAL, CKMB, CKMBINDEX, TROPONINI in the last 168 hours.  BNP (last 3 results) No results for input(s): PROBNP in the last 8760 hours.  HbA1C: No results for input(s): HGBA1C in the last 72 hours.  CBG:  Recent Labs Lab 09/28/15 0745  GLUCAP 109*    Lipid Profile: No results for input(s): CHOL, HDL, LDLCALC, TRIG, CHOLHDL, LDLDIRECT in the  last 72 hours.  Thyroid Function Tests:  Recent Labs  09/30/15 0548  FREET4 0.97    Anemia Panel: No results for input(s): VITAMINB12, FOLATE, FERRITIN, TIBC, IRON, RETICCTPCT in the last 72 hours.  Urine analysis:    Component Value Date/Time   COLORURINE Amber 01/02/2013 1014   APPEARANCEUR Hazy 01/02/2013 1014   LABSPEC 1.025 01/02/2013 1014   PHURINE 5.0 01/02/2013 1014   GLUCOSEU Negative 01/02/2013 1014   HGBUR Negative 01/02/2013 1014   BILIRUBINUR Negative 01/02/2013 1014   KETONESUR Trace 01/02/2013 1014   PROTEINUR 100 mg/dL 40/98/1191 4782   NITRITE Negative 01/02/2013 1014   LEUKOCYTESUR Negative 01/02/2013 1014    Sepsis Labs: (procalcitonin:4,lacticidven:4) )No results found for this or any previous visit (from the past 240 hour(s)).   Radiological Exams on Admission: No results found.  EKG: Independently reviewed.  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  Left eye is blind since 3 weeks ago. Right eye decreased vision, mostly peripheral. CT head 5/6  Negative. MRI head   Abnormal signal within the left optic nerve, optic chiasm bilaterally greater on the left and along the optic tracts bilaterally greater on the left.  Question result of demyelinating process (neuromyelitis optica), inflammatory process, infectious process or vasculitis as a cause of the optic nerve/chiasm/ track abnormality. Tumor is a lesslikely consideration. He was started on Solumedrol 125 mg q 12  . No significant improvement was noted.  Dr. Anise Salvo, Neuro is to continue his care in consultation, which includes plasmapheresis Plans as per Neuro   Hypertension BP 145/76 mmHg  Pulse 86  Temp(Src) 97.6 F (36.4 C) (Oral)  Resp 20  SpO2 99% Continue home anti-hypertensive medications.    Hyperlipidemia Continue home statins  Atrial Fibrillation On Coumadin for anticoagulation therapy. 2D Echo EF %. 50% to 55%,in the setting of heart failure, diastolic compensated /dilated cardiomyopathy Rate controlled with home meds.  PT/INR 19.6/1.66 Pharmacy to dose Coumadin   Chronic kidney disease stage II 60-89  baseline creatinine 1.2-1.3 today  Cr 1.25  -  Minimize nephrotoxins and renally dose medications Lab Results  Component Value Date   CREATININE 1.25* 09/29/2015   CREATININE 1.42* 09/28/2015   CREATININE 1.66* 09/27/2015        DVT prophylaxis: Coumadin   Code Status:   Full    Family Communication:  Discussed with patient Disposition Plan: Expect patient to be discharged to home after condition improves Consults called:    None Admission status: Admit med surg   Marcos Eke, PA-C [facility]   If 7PM-7AM, please contact night-coverage [email or web address] Password [facility]  10/01/2015, 3:43 PM

## 2015-10-01 NOTE — Procedures (Signed)
Hemodialysis Insertion Procedure Note Luis RamsayLorin B Byrd [id number] 05/01/1941  Procedure: Insertion of Hemodialysis Catheter Type: 3 port  Indications: Hemodialysis   Procedure Details Consent: Risks of procedure as well as the alternatives and risks of each were explained to the (patient/caregiver).  Consent for procedure obtained. Time Out: Verified patient identification, verified procedure, site/side was marked, verified correct patient position, special equipment/implants available, medications/allergies/relevent history reviewed, required imaging and test results available.  Performed  Maximum sterile technique was used including antiseptics, cap, gloves, gown, hand hygiene, mask and sheet. Skin prep: Chlorhexidine; local anesthetic administered A antimicrobial bonded/coated triple lumen catheter was placed in the right internal jugular vein using the Seldinger technique. Ultrasound guidance used.Yes.   Catheter placed to 15 cm. Blood aspirated via all 3 ports and then flushed x 3. Line sutured x 2 and dressing applied.  Evaluation Blood flow good Complications: No apparent complications Patient did tolerate procedure well. Chest X-ray ordered to verify placement.  CXR: normal.  Luis RoachPaul Domenique Byrd, AGACNP-BC [facility] Pager [phone] or [phone]  10/01/2015 7:26 PM

## 2015-10-02 ENCOUNTER — Encounter (HOSPITAL_COMMUNITY): Payer: Self-pay | Admitting: *Deleted

## 2015-10-02 DIAGNOSIS — Z452 Encounter for adjustment and management of vascular access device: Secondary | ICD-10-CM

## 2015-10-02 LAB — COMPREHENSIVE METABOLIC PANEL
ALBUMIN: 3 g/dL — AB (ref 3.5–5.0)
ALT: 23 U/L (ref 17–63)
AST: 19 U/L (ref 15–41)
Alkaline Phosphatase: 32 U/L — ABNORMAL LOW (ref 38–126)
Anion gap: 11 (ref 5–15)
BUN: 33 mg/dL — AB (ref 6–20)
CHLORIDE: 105 mmol/L (ref 101–111)
CO2: 24 mmol/L (ref 22–32)
CREATININE: 1.09 mg/dL (ref 0.61–1.24)
Calcium: 8.2 mg/dL — ABNORMAL LOW (ref 8.9–10.3)
GFR calc Af Amer: >60 mL/min (ref >60)
GFR calc non Af Amer: >60 mL/min (ref >60)
GLUCOSE: 133 mg/dL — AB (ref 65–99)
POTASSIUM: 3.2 mmol/L — AB (ref 3.5–5.1)
SODIUM: 140 mmol/L (ref 135–145)
Total Bilirubin: 0.9 mg/dL (ref 0.3–1.2)
Total Protein: 5.6 g/dL — ABNORMAL LOW (ref 6.5–8.1)

## 2015-10-02 LAB — CBC
HCT: 37.6 % — ABNORMAL LOW (ref 39.0–52.0)
Hemoglobin: 12.8 g/dL — ABNORMAL LOW (ref 13.0–17.0)
MCH: 29.8 pg (ref 26.0–34.0)
MCHC: 34 g/dL (ref 30.0–36.0)
MCV: 87.6 fL (ref 78.0–100.0)
PLATELETS: 154 10*3/uL (ref 150–400)
RBC: 4.29 MIL/uL (ref 4.22–5.81)
RDW: 14.1 % (ref 11.5–15.5)
WBC: 6.2 10*3/uL (ref 4.0–10.5)

## 2015-10-02 LAB — PROTIME-INR
INR: 2.24 — ABNORMAL HIGH (ref 0.00–1.49)
Prothrombin Time: 24.6 seconds — ABNORMAL HIGH (ref 11.6–15.2)

## 2015-10-02 LAB — POCT I-STAT, CHEM 8
BUN: 34 mg/dL — ABNORMAL HIGH (ref 6–20)
CALCIUM ION: 1.12 mmol/L — AB (ref 1.13–1.30)
Chloride: 100 mmol/L — ABNORMAL LOW (ref 101–111)
Creatinine, Ser: 1 mg/dL (ref 0.61–1.24)
GLUCOSE: 155 mg/dL — AB (ref 65–99)
HCT: 43 % (ref 39.0–52.0)
HEMOGLOBIN: 14.6 g/dL (ref 13.0–17.0)
Potassium: 3 mmol/L — ABNORMAL LOW (ref 3.5–5.1)
SODIUM: 141 mmol/L (ref 135–145)
TCO2: 25 mmol/L (ref 0–100)

## 2015-10-02 MED ORDER — WARFARIN SODIUM 2 MG PO TABS
2.0000 mg | ORAL_TABLET | Freq: Once | ORAL | Status: AC
  Administered 2015-10-02: 2 mg via ORAL
  Filled 2015-10-02: qty 1

## 2015-10-02 MED ORDER — CALCIUM GLUCONATE 10 % IV SOLN
4.0000 g | Freq: Once | INTRAVENOUS | Status: AC
  Administered 2015-10-02: 2 g via INTRAVENOUS
  Filled 2015-10-02: qty 40

## 2015-10-02 MED ORDER — CALCIUM CARBONATE ANTACID 500 MG PO CHEW: 2.0000 | CHEWABLE_TABLET | ORAL | Status: DC

## 2015-10-02 MED ORDER — POTASSIUM CHLORIDE CRYS ER 20 MEQ PO TBCR
40.0000 meq | EXTENDED_RELEASE_TABLET | Freq: Once | ORAL | Status: AC
  Administered 2015-10-02: 40 meq via ORAL
  Filled 2015-10-02: qty 2

## 2015-10-02 MED ORDER — CALCIUM CARBONATE ANTACID 500 MG PO CHEW
2.0000 | CHEWABLE_TABLET | ORAL | Status: DC
  Administered 2015-10-02: 400 mg via ORAL
  Filled 2015-10-02: qty 2

## 2015-10-02 MED ORDER — ACD FORMULA A 0.73-2.45-2.2 GM/100ML VI SOLN
500.0000 mL | Status: DC
  Administered 2015-10-02: 500 mL via INTRAVENOUS
  Filled 2015-10-02: qty 500

## 2015-10-02 MED ORDER — CALCIUM CARBONATE ANTACID 500 MG PO CHEW
CHEWABLE_TABLET | ORAL | Status: AC
  Filled 2015-10-02: qty 2

## 2015-10-02 MED ORDER — ACD FORMULA A 0.73-2.45-2.2 GM/100ML VI SOLN
Status: AC
  Administered 2015-10-02: 10:00:00
  Filled 2015-10-02: qty 500

## 2015-10-02 MED ORDER — HEPARIN SODIUM (PORCINE) 1000 UNIT/ML IJ SOLN
1000.0000 [IU] | Freq: Once | INTRAMUSCULAR | Status: DC
  Filled 2015-10-02: qty 1

## 2015-10-02 MED ORDER — ACD FORMULA A 0.73-2.45-2.2 GM/100ML VI SOLN
Status: AC
Start: 2015-10-02 — End: 2015-10-02
  Filled 2015-10-02: qty 500

## 2015-10-02 MED ORDER — ACETAMINOPHEN 325 MG PO TABS: 650.0000 mg | ORAL_TABLET | ORAL | Status: DC | PRN

## 2015-10-02 MED ORDER — DIPHENHYDRAMINE HCL 25 MG PO CAPS: 25.0000 mg | ORAL_CAPSULE | Freq: Four times a day (QID) | ORAL | Status: DC | PRN

## 2015-10-02 MED ORDER — SODIUM CHLORIDE 0.9 % IV SOLN: Freq: Once | INTRAVENOUS | Status: DC

## 2015-10-02 MED ORDER — SODIUM CHLORIDE 0.9 % IV SOLN
INTRAVENOUS | Status: AC
  Administered 2015-10-02 (×3): via INTRAVENOUS_CENTRAL
  Filled 2015-10-02 (×4): qty 200

## 2015-10-02 NOTE — Progress Notes (Signed)
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 MRI concerning for inflammatory process, Abnormal signal within the left optic nerve, optic chiasm bilaterally greater on the left and along the optic tracts bilaterally greater on the left. 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 Transferred to [facility] for plasmaphoresis.    PT Comments    Patient's balance more improved today holding onto IV pole for support. Continues to require max verbal and directional cues to compensate for visual deficits. Will attempt AD next session as tolerated even though pt reluctant. To start plasmaphoresis today. Will follow.   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 Restrictions Weight Bearing Restrictions: No    Mobility  Bed Mobility Overal bed mobility: Independent             General bed mobility comments: No deficits in bed mobility.   Transfers Overall transfer level: Needs assistance Equipment used: None Transfers: Sit to/from Stand Sit to Stand: Supervision         General transfer comment: Supervision for safety. No dizziness. Transferred to chair post ambulation bout.  Ambulation/Gait Ambulation/Gait assistance: Min assist Ambulation Distance (Feet): 300 Feet Assistive device:  (pushing IV pole) Gait Pattern/deviations: Step-through pattern;Decreased stride length Gait velocity: reduced   General Gait Details: Slow gait holding onto  IV pole for support. Requires max verbal and directional cues to navigate environment due to visual deficits. Would benefit from AD for support-however pt reluctant.   Stairs            Wheelchair Mobility    Modified Rankin (Stroke Patients Only)       Balance Overall balance assessment: Needs assistance Sitting-balance support: Feet supported;No upper extremity supported Sitting balance-Leahy Scale: Normal     Standing balance support: During functional activity Standing balance-Leahy Scale: Good Standing balance comment: Need for verbal input due to visual deficits.                    Cognition Arousal/Alertness: Awake/alert Behavior During Therapy: WFL for tasks assessed/performed Overall Cognitive Status: Within Functional Limits for tasks assessed                      Exercises      General Comments General comments (skin integrity, edema, etc.): Instructed pt how to use phone and call bell.      Pertinent Vitals/Pain Pain Assessment: No/denies pain    Home Living                      Prior Function            PT Goals (current goals can now be found in the care plan section) Progress towards PT goals: Progressing toward goals    Frequency  Min 3X/week    PT Plan Current plan remains appropriate    Co-evaluation             End of Session Equipment Utilized During Treatment: Gait belt  Activity Tolerance: Patient tolerated treatment well Patient left: in chair;with call bell/phone within reach (chair alarm bed under patient but no box available.)     Time: [id number] PT Time Calculation (min) (ACUTE ONLY): 18 min  Charges:  $Gait Training: 8-22 mins                    G Codes:      Caryl Fate A Mckinsey Keagle 10/02/2015, 8:25 AM Mylo RedShauna Amrie Gurganus, PT, DPT [phone]

## 2015-10-02 NOTE — Progress Notes (Addendum)
ANTICOAGULATION CONSULT NOTE - Follow Up Consult  Pharmacy Consult for Warfarin dosing Indication: atrial fibrillation  No Known Allergies  Patient Measurements: Height: 5\' 9"  (175.3 cm) Weight: 190 lb 0.6 oz (86.2 kg) IBW/kg (Calculated) : 70.7  Vital Signs: Temp: 97.8 F (36.6 C) (05/11 1122) Temp Source: Oral (05/11 1122) BP: 140/80 mmHg (05/11 1122) Pulse Rate: 75 (05/11 1037)  Labs:  Recent Labs  09/30/15 0548 10/01/15 0428 10/02/15 0608 10/02/15 0853  HGB  --   --  12.8* 14.6  HCT  --   --  37.6* 43.0  PLT  --   --  154  --   LABPROT 18.2* 19.6* 24.6*  --   INR 1.50 1.66 2.24*  --   CREATININE  --   --  1.09 1.00   Estimated Creatinine Clearance: 68.4 mL/min (by C-G formula based on Cr of 1).  Medications:  Prescriptions prior to admission  Medication Sig Dispense Refill Last Dose  . amLODipine (NORVASC) 10 MG tablet Take 10 mg by mouth 2 (two) times daily.   10/01/2015 at Unknown time  . DIGOX 125 MCG tablet Take 125 mcg by mouth daily.   10/01/2015 at Unknown time  . docusate sodium (COLACE) 50 MG capsule Take 150 mg by mouth at bedtime.    Past Week at Unknown time  . Glucosamine-Chondroitin 250-200 MG TABS Take 2 tablets by mouth daily.   Past Week at Unknown time  . hydrochlorothiazide (HYDRODIURIL) 25 MG tablet Take 25 mg by mouth 2 (two) times daily.   10/01/2015 at Unknown time  . OVER THE COUNTER MEDICATION Take 2 tablets by mouth daily. Jonette Eva*Antiiva* for prostate health   10/01/2015 at Unknown time  . oxyCODONE-acetaminophen (PERCOCET/ROXICET) 5-325 MG tablet Take 1 tablet by mouth every 6 (six) hours as needed. For pain.   Past Week at Unknown time  . potassium chloride SA (K-DUR,KLOR-CON) 20 MEQ tablet Take 40 mEq by mouth 2 (two) times daily.   10/01/2015 at Unknown time  . predniSONE (DELTASONE) 10 MG tablet Take 10 mg by mouth daily.   10/01/2015 at Unknown time  . warfarin (COUMADIN) 2 MG tablet Take 6 mg by mouth daily. Take three of the 2mg  tablet    10/01/2015 at 0800  . Zinc 50 MG TABS Take 50 mg by mouth every morning.   10/01/2015 at Unknown time    Assessment: Patient is a 75yo male [facility] with loss of vision. Patient was taking Coumadin 6mg  daily prior to admission for afib. INR was supratherapeutic on admission, appears that last dose of Coumadin was on 09/26/15 [facility]. [facility] for plasmapheresis treatment of optic neuritis resistant to steroid therapy.  Pharmacy consulted to dose coumadin for h/o afib.  INR increased today to therapeutic range  @ 2.24.  Previously [facility]6 and [facility] >3.  Hgb 14.5>12.8, pltc [id number]>154K. No bleeding noted. I will decrease dose today.   Dosing history: 5/6 INR: 3.36, no warfarin 5/7 INR: 3.18, no warfarin 5/8 INR: 1.99, warfarin 5 mg 5/9 INR: 1.50, warfarin 6 mg 5/10 INR: 1.66;  Warfarin 6 mg 5/11 INR 2.24  Goal of Therapy:  INR 2-3  Plan:  Coumadin 2mg  today x1 Daily INR.   Thank you for allowing pharmacy to be part of this patient's care. Noah Delaineuth Forest Redwine, RPh Clinical Pharmacist Pager: [phone] 10/02/2015 12:25 PM

## 2015-10-02 NOTE — Progress Notes (Signed)
[facility] Progress Note  Patient: Luis Byrd WUJ:[id number]   PCP: Marguarite Arbour, MD DOB: May 05, 1941   DOA: 10/01/2015   DOS: 10/02/2015   Date of Service: the patient was seen and examined on 10/02/2015  Subjective: The patient denies having any compressive headache, no nausea no vomiting. No focal deficit no chest pain. The patient is continued to remain worsening in the left side. Nutrition: Tolerating oral diet  Brief hospital course: Patient was admitted on 10/01/2015, with complaint of right eye pain with vision loss in both eyes, was found to have bilateral optic neuritis. The patient is transferred here from [facility] for plasmapheresis. Currently further plan is continue steroids to complete a 5 day treatment and also continue plasmapheresis.  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 Appreciate input. Continue steroids for a total of 5 days with high dose. Plasmapheresis 1 out of 5 today.  2. Essential hypertension. Continue home blood pressure medications. Blood pressure stable.  3. Dyslipidemia. Continue statin.  4. A. fib. Chronic anticoagulation. [place] Vasc 5 Continue defibrillation currently rate controlled  5. Chronic kidney disease stage II. Renal function stable continue to monitor.  6. Hypokalemia. Replacing orally.  Pain management: When necessary Tylenol, continue home Percocet Activity: physical therapy home health Bowel regimen: last BM prior to admission Diet: Regular diet DVT Prophylaxis: on therapeutic anticoagulation.  Advance goals of care discussion: Full code  Family Communication: no family was present at bedside, at the time of interview.   Disposition:  Discharge to home Expected discharge date: 10/07/2015 after completion of TPE   Consultants: Neurology Procedures: TC plasmapheresis  Antibiotics: Anti-infectives    None        Intake/Output Summary (Last 24 hours) at 10/02/15  1852 Last data filed at 10/02/15 0625  Gross per 24 hour  Intake      0 ml  Output   1120 ml  Net  -1120 ml   Filed Weights   10/02/15 0534 10/02/15 0845 10/02/15 1037  Weight: 86.41 kg (190 lb 8 oz) 85.8 kg (189 lb 2.5 oz) 86.2 kg (190 lb 0.6 oz)    Objective: Physical Exam: Filed Vitals:   10/02/15 1037 10/02/15 1122 10/02/15 1330 10/02/15 1825  BP: 165/70 140/80 143/84 135/76  Pulse: 75  78 91  Temp: 97.5 F (36.4 C) 97.8 F (36.6 C) 98 F (36.7 C) 98 F (36.7 C)  TempSrc: Oral Oral Oral Oral  Resp: Height:      Weight: 86.2 kg (190 lb 0.6 oz)     SpO2: 99% 98% 99% 98%    General: Alert, Awake and Oriented to Time, Place and Person. Appear in mild distress Eyes:APD left eye Conjunctiva normal ENT: Oral Mucosa clear moist. Neck: no JVD, noAbnormal Mass Or lumps Cardiovascular: S1 and S2 Present, no Murmur, Peripheral Pulses Present Respiratory: Bilateral Air entry equal and Decreased,  Clear to Auscultation, no Crackles, no wheezes Abdomen: Bowel Sound present, Soft and no tenderness Skin: redness no, no Rash  Extremities: no Pedal edema, no calf tenderness Neurologic: Grossly no focal neuro deficit.Bilaterally Equal motor strength Blind in the left eye  Data Reviewed: CBC:  Recent Labs Lab 09/27/15 1858 10/02/15 0608 10/02/15 0853  WBC 5.7 6.2  --   NEUTROABS 4.2  --   --   HGB 14.5 12.8* 14.6  HCT 43.6 37.6* 43.0  MCV 87.4 87.6  --   PLT 142* 154  --    Basic Metabolic Panel:  Recent Labs Lab 09/27/15 1858 09/28/15 [id number] 09/28/15 1446 09/28/15 1816 09/29/15 0420 10/02/15 0608 10/02/15 0853  NA 140 138  --   --  137 140 141  K 3.2* 2.8*  --  3.9 3.8 3.2* 3.0*  CL 100* 98*  --   --  100* 105 100*  CO2 30 32  --   --  25 24  --   GLUCOSE 149* 132*  --   --  166* 133* 155*  BUN 27* 25*  --   --  28* 33* 34*  CREATININE 1.66* 1.42*  --   --  1.25* 1.09 1.00  CALCIUM 9.7 9.2  --   --  8.7* 8.2*  --   MG  --   --  1.8  --  1.8  --    --     Liver Function Tests:  Recent Labs Lab 10/02/15 0608  AST 19  ALT 23  ALKPHOS 32*  BILITOT 0.9  PROT 5.6*  ALBUMIN 3.0*   No results for input(s): LIPASE, AMYLASE in the last 168 hours. No results for input(s): AMMONIA in the last 168 hours. Coagulation Profile:  Recent Labs Lab 09/28/15 0922 09/29/15 0420 09/30/15 0548 10/01/15 0428 10/02/15 0608  INR 3.18 1.99 1.50 1.66 2.24*   Cardiac Enzymes: No results for input(s): CKTOTAL, CKMB, CKMBINDEX, TROPONINI in the last 168 hours. BNP (last 3 results) No results for input(s): PROBNP in the last 8760 hours.  CBG:  Recent Labs Lab 09/28/15 0745  GLUCAP 109*    Studies: No results found.   Scheduled Meds: . amLODipine  10 mg Oral BID  . digoxin  125 mcg Oral Daily  . heparin  3,000 Units Intravenous Once  . hydrochlorothiazide  25 mg Oral BID  . methylPREDNISolone (SOLU-MEDROL) injection  500 mg Intravenous Q12H  . Warfarin - Pharmacist Dosing Inpatient   Does not apply q1800   Continuous Infusions: . sodium chloride 75 mL/hr at 10/02/15 1212   PRN Meds: acetaminophen **OR** acetaminophen, bisacodyl, HYDROcodone-acetaminophen, ondansetron **OR** ondansetron (ZOFRAN) IV, senna-docusate, traZODone  Time spent: 30 minutes  Author: Lynden OxfordPranav Ilhan Madan, MD [facility] Pager: [phone] 10/02/2015 6:52 PM  If 7PM-7AM, please contact night-coverage at [email or web address], password [email or web address]

## 2015-10-02 NOTE — Progress Notes (Signed)
Subjective: Received TPE this am, went fine.   Exam: Filed Vitals:   10/02/15 1122 10/02/15 1330  BP: 140/80 143/84  Pulse:  78  Temp: 97.8 F (36.6 C) 98 F (36.7 C)  Resp: 16 20   Gen: In bed, NAD Resp: non-labored breathing, no acute distress Abd: soft, nt  Neuro: APD on the left, interactive and appropriate.   Pertinent Labs:  Elevated INR  Impression: 75 yo M with suspected optic neuritis. He has been started on plasma exchange and will get 5 treatments with tx QOD.   Recommendations: 1) continue steroids for five day course.  2) PLEX tx 1/5 today.  3) will follow.   Ritta SlotMcNeill Kirkpatrick, MD [facility] [phone]  If 7pm- 7am, please page neurology on call as listed in AMION.

## 2015-10-03 ENCOUNTER — Other Ambulatory Visit (HOSPITAL_COMMUNITY): Payer: Medicare Other

## 2015-10-03 ENCOUNTER — Inpatient Hospital Stay (HOSPITAL_COMMUNITY): Payer: Medicare Other

## 2015-10-03 DIAGNOSIS — G360 Neuromyelitis optica [Devic]: Principal | ICD-10-CM

## 2015-10-03 LAB — COMPREHENSIVE METABOLIC PANEL
ALT: 18 U/L (ref 17–63)
AST: 20 U/L (ref 15–41)
Albumin: 3.5 g/dL (ref 3.5–5.0)
Alkaline Phosphatase: 22 U/L — ABNORMAL LOW (ref 38–126)
Anion gap: 13 (ref 5–15)
BILIRUBIN TOTAL: 1 mg/dL (ref 0.3–1.2)
BUN: 28 mg/dL — AB (ref 6–20)
CHLORIDE: 104 mmol/L (ref 101–111)
CO2: 25 mmol/L (ref 22–32)
CREATININE: 1.12 mg/dL (ref 0.61–1.24)
Calcium: 7.9 mg/dL — ABNORMAL LOW (ref 8.9–10.3)
Glucose, Bld: 247 mg/dL — ABNORMAL HIGH (ref 65–99)
POTASSIUM: 3.1 mmol/L — AB (ref 3.5–5.1)
Sodium: 142 mmol/L (ref 135–145)
TOTAL PROTEIN: 5.2 g/dL — AB (ref 6.5–8.1)

## 2015-10-03 LAB — CBC WITH DIFFERENTIAL/PLATELET
BASOS ABS: 0 10*3/uL (ref 0.0–0.1)
Basophils Relative: 0 %
Eosinophils Absolute: 0 10*3/uL (ref 0.0–0.7)
Eosinophils Relative: 0 %
HEMATOCRIT: 40.5 % (ref 39.0–52.0)
Hemoglobin: 13.8 g/dL (ref 13.0–17.0)
LYMPHS PCT: 11 %
Lymphs Abs: 0.6 10*3/uL — ABNORMAL LOW (ref 0.7–4.0)
MCH: 29.7 pg (ref 26.0–34.0)
MCHC: 34.1 g/dL (ref 30.0–36.0)
MCV: 87.1 fL (ref 78.0–100.0)
Monocytes Absolute: 0.2 10*3/uL (ref 0.1–1.0)
Monocytes Relative: 3 %
NEUTROS ABS: 4.7 10*3/uL (ref 1.7–7.7)
Neutrophils Relative %: 86 %
PLATELETS: 140 10*3/uL — AB (ref 150–400)
RBC: 4.65 MIL/uL (ref 4.22–5.81)
RDW: 13.8 % (ref 11.5–15.5)
WBC: 5.5 10*3/uL (ref 4.0–10.5)

## 2015-10-03 LAB — MISC LABCORP TEST (SEND OUT): LABCORP TEST CODE: 4210

## 2015-10-03 LAB — PROTIME-INR
INR: 3.52 — AB (ref 0.00–1.49)
Prothrombin Time: 34.5 seconds — ABNORMAL HIGH (ref 11.6–15.2)

## 2015-10-03 LAB — MAGNESIUM: MAGNESIUM: 2.4 mg/dL (ref 1.7–2.4)

## 2015-10-03 MED ORDER — GADOBENATE DIMEGLUMINE 529 MG/ML IV SOLN
20.0000 mL | Freq: Once | INTRAVENOUS | Status: AC | PRN
  Administered 2015-10-03: 20 mL via INTRAVENOUS

## 2015-10-03 MED ORDER — SODIUM CHLORIDE 0.9% FLUSH
10.0000 mL | INTRAVENOUS | Status: DC | PRN
  Administered 2015-10-03 – 2015-10-08 (×8): 10 mL
  Filled 2015-10-03 (×7): qty 40

## 2015-10-03 MED ORDER — POTASSIUM CHLORIDE CRYS ER 20 MEQ PO TBCR
40.0000 meq | EXTENDED_RELEASE_TABLET | Freq: Once | ORAL | Status: AC
Start: 2015-10-03 — End: 2015-10-03
  Administered 2015-10-03: 40 meq via ORAL
  Filled 2015-10-03: qty 2

## 2015-10-03 NOTE — Progress Notes (Signed)
ANTICOAGULATION CONSULT NOTE - Follow Up Consult  Pharmacy Consult for Warfarin dosing Indication: atrial fibrillation  No Known Allergies  Patient Measurements: Height: 5\' 9"  (175.3 cm) Weight: 190 lb 0.6 oz (86.2 kg) IBW/kg (Calculated) : 70.7  Vital Signs: Temp: 97.6 F (36.4 C) (05/12 0531) Temp Source: Oral (05/12 0531) BP: 137/73 mmHg (05/12 0531) Pulse Rate: 94 (05/12 0531)  Labs:  Recent Labs  10/01/15 0428  10/02/15 0608 10/02/15 0853 10/03/15 0845  HGB  --   < > 12.8* 14.6 13.8  HCT  --   --  37.6* 43.0 40.5  PLT  --   --  154  --  140*  LABPROT 19.6*  --  24.6*  --  34.5*  INR 1.66  --  2.24*  --  3.52*  CREATININE  --   --  1.09 1.00 1.12  < > = values in this interval not displayed. Estimated Creatinine Clearance: 61 mL/min (by C-G formula based on Cr of 1.12).  Medications:  Prescriptions prior to admission  Medication Sig Dispense Refill Last Dose  . amLODipine (NORVASC) 10 MG tablet Take 10 mg by mouth 2 (two) times daily.   10/01/2015 at Unknown time  . DIGOX 125 MCG tablet Take 125 mcg by mouth daily.   10/01/2015 at Unknown time  . docusate sodium (COLACE) 50 MG capsule Take 150 mg by mouth at bedtime.    Past Week at Unknown time  . Glucosamine-Chondroitin 250-200 MG TABS Take 2 tablets by mouth daily.   Past Week at Unknown time  . hydrochlorothiazide (HYDRODIURIL) 25 MG tablet Take 25 mg by mouth 2 (two) times daily.   10/01/2015 at Unknown time  . OVER THE COUNTER MEDICATION Take 2 tablets by mouth daily. Jonette Eva*Antiiva* for prostate health   10/01/2015 at Unknown time  . oxyCODONE-acetaminophen (PERCOCET/ROXICET) 5-325 MG tablet Take 1 tablet by mouth every 6 (six) hours as needed. For pain.   Past Week at Unknown time  . potassium chloride SA (K-DUR,KLOR-CON) 20 MEQ tablet Take 40 mEq by mouth 2 (two) times daily.   10/01/2015 at Unknown time  . predniSONE (DELTASONE) 10 MG tablet Take 10 mg by mouth daily.   10/01/2015 at Unknown time  . warfarin  (COUMADIN) 2 MG tablet Take 6 mg by mouth daily. Take three of the 2mg  tablet   10/01/2015 at 0800  . Zinc 50 MG TABS Take 50 mg by mouth every morning.   10/01/2015 at Unknown time    Assessment: Patient is a 75yo male [facility] with [facility] of vision. Patient was taking Coumadin 6mg  daily prior to admission for afib. INR was supratherapeutic on admission, appears that last dose of Coumadin was on 09/26/15 [facility]. [facility] for plasmapheresis treatment of optic neuritis resistant to steroid therapy.  Pharmacy consulted to dose coumadin for h/o afib.  INR = 3.52 today, increased from 2.24  Yesterday.   Previously [facility] ([facility]6 and 5/7[facility] to [facility]R >3.  Hgb 13.8 stable pltc 154>140k. No bleeding noted.   Goal of Therapy:  INR 2-3  Plan:  Hold Coumadin today due to supratherapeutic INR. Daily INR.   Thank you for allowing pharmacy to be part of this patient's care. Luis Byrd, RPh Clinical Pharmacist Pager: [phone] 10/03/2015 10:38 AM

## 2015-10-03 NOTE — Progress Notes (Signed)
[facility] Progress Note  Patient: Luis Byrd WUJ:[id number]   PCP: Marguarite Arbour, MD DOB: 1940-08-02   DOA: 10/01/2015   DOS: 10/03/2015   Date of Service: the patient was seen and examined on 10/03/2015  Subjective: Patient continues to deny any acute complaint other than the weakness on the vision on the left. Nutrition: Tolerating oral diet  Brief hospital course: Patient was admitted on 10/01/2015, with complaint of right eye pain with vision loss in both eyes, was found to have bilateral optic neuritis. The patient is transferred here from [facility] for plasmapheresis. Currently further plan is continue steroids to complete a 5 day treatment and also continue plasmapheresis.  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 Appreciate input. Continue steroids for a total of 5 days with high dose. Plasmapheresis 1 out of 5 on 10/02/2015.  2. Essential hypertension. Continue home blood pressure medications. Blood pressure stable. Discontinue hydrochlorothiazide while the patient is receiving IV fluids  3. Dyslipidemia. Continue statin.  4. A. fib. Chronic anticoagulation. [place] Vasc 5 Continue warfarin, currently rate controlled  5. Chronic kidney disease stage II. Renal function stable continue to monitor.  6. Hypokalemia. Replacing orally. Discontinue hydrochlorothiazide will recheck BMP in the morning  Pain management: When necessary Tylenol, continue home Percocet Activity: physical therapy home health Bowel regimen: last BM prior to admission Diet: Regular diet DVT Prophylaxis: on therapeutic anticoagulation.  Advance goals of care discussion: Full code  Family Communication: no family was present at bedside, at the time of interview.   Disposition:  Discharge to home Expected discharge date: 10/07/2015 after completion of TPE  Consultants: Neurology Procedures: TC plasmapheresis  Antibiotics: Anti-infectives    None        Intake/Output Summary (Last 24 hours) at 10/03/15 1912 Last data filed at 10/03/15 1500  Gross per 24 hour  Intake   1554 ml  Output    550 ml  Net   1004 ml   Filed Weights   10/02/15 0534 10/02/15 0845 10/02/15 1037  Weight: 86.41 kg (190 lb 8 oz) 85.8 kg (189 lb 2.5 oz) 86.2 kg (190 lb 0.6 oz)    Objective: Physical Exam: Filed Vitals:   10/03/15 0531 10/03/15 1030 10/03/15 1351 10/03/15 1824  BP: 137/73 144/77 143/74 154/78  Pulse: 94 87 87 101  Temp: 97.6 F (36.4 C) 98 F (36.7 C) 98 F (36.7 C) 98.4 F (36.9 C)  TempSrc: Oral Oral Oral Oral  Resp: Height:      Weight:      SpO2: 97% 98% 99% 99%    General: Alert, Awake and Oriented to Time, Place and Person. Appear in mild distress Eyes:APD left eye Conjunctiva normal ENT: Oral Mucosa clear moist. Neck: no JVD, noAbnormal Mass Or lumps Cardiovascular: S1 and S2 Present, no Murmur, Peripheral Pulses Present Respiratory: Bilateral Air entry equal and Decreased,  Clear to Auscultation, no Crackles, no wheezes Abdomen: Bowel Sound present, Soft and no tenderness Skin: redness no, no Rash  Extremities: no Pedal edema, no calf tenderness Neurologic: Grossly no focal neuro deficit.Bilaterally Equal motor strength Blind in the left eye  Data Reviewed: CBC:  Recent Labs Lab 09/27/15 1858 10/02/15 0608 10/02/15 0853 10/03/15 0845  WBC 5.7 6.2  --  5.5  NEUTROABS 4.2  --   --  4.7  HGB 14.5 12.8* 14.6 13.8  HCT 43.6 37.6* 43.0 40.5  MCV 87.4 87.6  --  87.1  PLT 142* 154  --  140*   Basic Metabolic Panel:  Recent Labs Lab 09/27/15 1858 09/28/15 0922 09/28/15 1446 09/28/15 1816 09/29/15 0420 10/02/15 0608 10/02/15 0853 10/03/15 0845  NA 140 138  --   --  137 140 141 142  K 3.2* 2.8*  --  3.9 3.8 3.2* 3.0* 3.1*  CL 100* 98*  --   --  100* 105 100* 104  CO2 30 32  --   --  25 24  --  25  GLUCOSE 149* 132*  --   --  166* 133* 155* 247*  BUN 27* 25*  --   --  28* 33* 34* 28*    CREATININE 1.66* 1.42*  --   --  1.25* 1.09 1.00 1.12  CALCIUM 9.7 9.2  --   --  8.7* 8.2*  --  7.9*  MG  --   --  1.8  --  1.8  --   --  2.4    Liver Function Tests:  Recent Labs Lab 10/02/15 0608 10/03/15 0845  AST 19 20  ALT 23 18  ALKPHOS 32* 22*  BILITOT 0.9 1.0  PROT 5.6* 5.2*  ALBUMIN 3.0* 3.5   No results for input(s): LIPASE, AMYLASE in the last 168 hours. No results for input(s): AMMONIA in the last 168 hours. Coagulation Profile:  Recent Labs Lab 09/29/15 0420 09/30/15 0548 10/01/15 0428 10/02/15 0608 10/03/15 0845  INR 1.99 1.50 1.66 2.24* 3.52*   Cardiac Enzymes: No results for input(s): CKTOTAL, CKMB, CKMBINDEX, TROPONINI in the last 168 hours. BNP (last 3 results) No results for input(s): PROBNP in the last 8760 hours.  CBG:  Recent Labs Lab 09/28/15 0745  GLUCAP 109*    Studies: Mr Birdie HopesOrbits Wo/w Cm  10/03/2015  CLINICAL DATA:  Steroid resistant optic neuritis, here for plasmapheresis. Acute vision loss and eye pain, headache and dizziness Sep 27, 2015 after central retina artery occlusion. EXAM: MRI OF THE ORBITS WITHOUT AND WITH CONTRAST TECHNIQUE: Multiplanar, multisequence MR imaging of the orbits was performed both before and after the administration of intravenous contrast. CONTRAST:  20mL MULTIHANCE GADOBENATE DIMEGLUMINE 529 MG/ML IV SOLN COMPARISON:  MRI head Sep 28, 2015 FINDINGS: Diffusely bright T2 signal along the LEFT optic nerve, the level of the LEFT optic chiasm. Punctate focus of T2 bright signal within LEFT optic disc. Ocular globes intact, homogeneous T2 bright signal. Lenses are located. No abnormal enhancement of the optic nerves or orbital contents though the post gadolinium sequences are mildly motion degraded. Normal appearance of the extraocular muscles. Superior ophthalmic veins are not enlarged. Preservation of the orbital fat. Trace paranasal sinus mucosal thickening. Imaged mastoid air cells are well aerated. IMPRESSION:  Severely edematous LEFT optic nerve to level the optic chiasm without enhancement. Findings favor optic nerve infarct, less likely demyelination. Electronically Signed   By: Awilda Metroourtnay  Bloomer M.D.   On: 10/03/2015 01:28     Scheduled Meds: . amLODipine  10 mg Oral BID  . digoxin  125 mcg Oral Daily  . heparin  3,000 Units Intravenous Once  . Warfarin - Pharmacist Dosing Inpatient   Does not apply q1800   Continuous Infusions: . sodium chloride 75 mL/hr at 10/02/15 1212   PRN Meds: acetaminophen **OR** acetaminophen, bisacodyl, HYDROcodone-acetaminophen, ondansetron **OR** ondansetron (ZOFRAN) IV, senna-docusate, sodium chloride flush, traZODone  Time spent: 30 minutes  Author: Lynden OxfordPranav Patel, MD [facility] Pager: [phone] 10/03/2015 7:12 PM  If 7PM-7AM, please contact night-coverage at [email or web address], password [email or web address]

## 2015-10-03 NOTE — Progress Notes (Signed)
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  He has decreased vision in all but the right upper quadrant of the right eye.  Pertinent Labs:  Elevated INR  Impression: 75 yo M with suspected optic neuritis. He has been started on plasma exchange and will get 5 treatments with tx QOD. The history of painful gradual vision loss prior to complete loss on the left side I think would favor an etiology other than just central retinal artery occlusion. His subsequent loss of vision on the right eye as well with subsequent improvement with steroids makes me think that there is likely an inflammatory etiology. This was not clearly demonstrated on the contrasted MRI of the orbits, however I wonder if steroids might of played some role in that.  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 On the initial MRI of the head, there did seem to be some evidence of abnormal syndrome in the right optic nerve as well.  Recommendations: 1) continue steroids for five day course.  2) PLEX tx 2/5 tomorrow 3) will follow.   Ritta SlotMcNeill Elya Diloreto, MD [facility] [phone]  If 7pm- 7am, please page neurology on call as listed in AMION.

## 2015-10-03 NOTE — Progress Notes (Addendum)
Physical Therapy Treatment Patient Details Name: Luis Byrd MRN: [id number] DOB: 15-Dec-1940 Today's Date: 10/03/2015    History of Present Illness Patient is a 75 y/o male that presents with recent history of L central retinal occlusion (2-3 weeks ago) and new onset R repipheral vision loss. MRI concerning for inflammatory process, Abnormal signal within the left optic nerve, optic chiasm bilaterally greater on the left and along the optic tracts bilaterally greater on the left. 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 Transferred to [facility] for plasmaphoresis.    PT Comments    Patient very pleasant and eager to participate. Pt reported no changes in vision today. Practiced gait with AD this session with pt able to demo more normalized gait pattern but continues to need max verbal cues for directions due to visual deficits. Take RW next session. Current plan remains appropriate.   Follow Up Recommendations  Home health PT;Supervision/Assistance - 24 hour     Equipment Recommendations  Rolling walker with 5" wheels    Recommendations for Other Services OT consult     Precautions / Restrictions Precautions Precautions: Fall Precaution Comments: blind in left eye, No vision in temporal visual field of right eye. Restrictions Weight Bearing Restrictions: No    Mobility  Bed Mobility Overal bed mobility: Independent             General bed mobility comments: No deficits in bed mobility.   Transfers Overall transfer level: Needs assistance Equipment used: None Transfers: Sit to/from Stand Sit to Stand: Supervision         General transfer comment: supervision for safety due to vision impairments; no unsteadiness noted  Ambulation/Gait Ambulation/Gait assistance: Min assist Ambulation Distance (Feet): 250 Feet Assistive device: 1 person hand held assist;Standard walker (pushing IV pole) Gait Pattern/deviations: Step-through pattern;Decreased  stride length;Drifts right/left Gait velocity: reduced   General Gait Details: initially HHA with pt pushing IV pole for ~100 ft with pt taking short, guarded steps; instructed pt in use of DME with max cues and min physical assist for guidance of AD and directional changes due to vision; pt with LOB and able to take increased step lengths and cadence with AD   Stairs            Wheelchair Mobility    Modified Rankin (Stroke Patients Only)       Balance Overall balance assessment: Needs assistance Sitting-balance support: No upper extremity supported;Feet supported Sitting balance-Leahy Scale: Normal     Standing balance support: During functional activity Standing balance-Leahy Scale: Good                      Cognition Arousal/Alertness: Awake/alert Behavior During Therapy: WFL for tasks assessed/performed Overall Cognitive Status: Within Functional Limits for tasks assessed                      Exercises      General Comments General comments (skin integrity, edema, etc.): no RW available at the time of session; practice mobility with RW next session      Pertinent Vitals/Pain Pain Assessment: No/denies pain    Home Living                      Prior Function            PT Goals (current goals can now be found in the care plan section) Acute Rehab PT Goals Patient Stated Goal: return to independence and take  care of wife again Progress towards PT goals: Progressing toward goals    Frequency  Min 3X/week    PT Plan Current plan remains appropriate    Co-evaluation             End of Session Equipment Utilized During Treatment: Gait belt Activity Tolerance: Patient tolerated treatment well Patient left: in bed;with call bell/phone within reach     Time: 1009-1037 PT Time Calculation (min) (ACUTE ONLY): 28 min  Charges:  $Gait Training: 23-37 mins                    G Codes:      [name] Aravind Chrismer  Larken Urias, PTA Pager: [phone]   10/03/2015, 10:46 AM

## 2015-10-04 LAB — CBC
HCT: 41 % (ref 39.0–52.0)
Hemoglobin: 14.1 g/dL (ref 13.0–17.0)
MCH: 29.3 pg (ref 26.0–34.0)
MCHC: 34.4 g/dL (ref 30.0–36.0)
MCV: 85.2 fL (ref 78.0–100.0)
PLATELETS: 125 10*3/uL — AB (ref 150–400)
RBC: 4.81 MIL/uL (ref 4.22–5.81)
RDW: 13.6 % (ref 11.5–15.5)
WBC: 7.3 10*3/uL (ref 4.0–10.5)

## 2015-10-04 LAB — BASIC METABOLIC PANEL
ANION GAP: 11 (ref 5–15)
BUN: 28 mg/dL — ABNORMAL HIGH (ref 6–20)
CALCIUM: 7.9 mg/dL — AB (ref 8.9–10.3)
CO2: 27 mmol/L (ref 22–32)
CREATININE: 1.03 mg/dL (ref 0.61–1.24)
Chloride: 103 mmol/L (ref 101–111)
GFR calc Af Amer: >60 mL/min (ref >60)
GFR calc non Af Amer: >60 mL/min (ref >60)
GLUCOSE: 147 mg/dL — AB (ref 65–99)
POTASSIUM: 2.6 mmol/L — AB (ref 3.5–5.1)
SODIUM: 141 mmol/L (ref 135–145)

## 2015-10-04 LAB — PROTIME-INR
INR: 3.16 — AB (ref 0.00–1.49)
PROTHROMBIN TIME: 31.8 s — AB (ref 11.6–15.2)

## 2015-10-04 LAB — MAGNESIUM: Magnesium: 2.3 mg/dL (ref 1.7–2.4)

## 2015-10-04 LAB — POTASSIUM: Potassium: 3.2 mmol/L — ABNORMAL LOW (ref 3.5–5.1)

## 2015-10-04 MED ORDER — SODIUM CHLORIDE 0.9 % IV SOLN
4.0000 g | Freq: Once | INTRAVENOUS | Status: AC
  Administered 2015-10-04: 2 g via INTRAVENOUS
  Filled 2015-10-04: qty 40

## 2015-10-04 MED ORDER — POTASSIUM CHLORIDE 10 MEQ/100ML IV SOLN: 10.0000 meq | INTRAVENOUS | Status: DC

## 2015-10-04 MED ORDER — POTASSIUM CHLORIDE CRYS ER 20 MEQ PO TBCR
40.0000 meq | EXTENDED_RELEASE_TABLET | Freq: Once | ORAL | Status: AC
Start: 2015-10-04 — End: 2015-10-04
  Administered 2015-10-04: 40 meq via ORAL
  Filled 2015-10-04: qty 2

## 2015-10-04 MED ORDER — ACETAMINOPHEN 325 MG PO TABS
650.0000 mg | ORAL_TABLET | ORAL | Status: DC | PRN
  Filled 2015-10-04: qty 2

## 2015-10-04 MED ORDER — ACD FORMULA A 0.73-2.45-2.2 GM/100ML VI SOLN
Status: AC
  Filled 2015-10-04: qty 500

## 2015-10-04 MED ORDER — DIPHENHYDRAMINE HCL 25 MG PO CAPS: 25.0000 mg | ORAL_CAPSULE | Freq: Four times a day (QID) | ORAL | Status: DC | PRN

## 2015-10-04 MED ORDER — SODIUM CHLORIDE 0.9 % IV SOLN
INTRAVENOUS | Status: AC
  Administered 2015-10-04 (×3): via INTRAVENOUS_CENTRAL
  Filled 2015-10-04 (×3): qty 200

## 2015-10-04 MED ORDER — POTASSIUM CHLORIDE 20 MEQ/15ML (10%) PO SOLN: 20.0000 meq | Freq: Every day | ORAL | Status: DC

## 2015-10-04 MED ORDER — CALCIUM CARBONATE ANTACID 500 MG PO CHEW: 2.0000 | CHEWABLE_TABLET | ORAL | Status: AC

## 2015-10-04 MED ORDER — ACD FORMULA A 0.73-2.45-2.2 GM/100ML VI SOLN
500.0000 mL | Status: DC
  Administered 2015-10-06: 500 mL via INTRAVENOUS

## 2015-10-04 MED ORDER — HEPARIN SODIUM (PORCINE) 1000 UNIT/ML IJ SOLN: 1000.0000 [IU] | Freq: Once | INTRAMUSCULAR | Status: DC

## 2015-10-04 MED ORDER — FLUCONAZOLE 100 MG PO TABS
100.0000 mg | ORAL_TABLET | Freq: Every day | ORAL | Status: DC
  Administered 2015-10-04 – 2015-10-13 (×10): 100 mg via ORAL
  Filled 2015-10-04 (×10): qty 1

## 2015-10-04 MED ORDER — POTASSIUM CHLORIDE CRYS ER 20 MEQ PO TBCR
40.0000 meq | EXTENDED_RELEASE_TABLET | Freq: Every day | ORAL | Status: DC
  Administered 2015-10-04 – 2015-10-05 (×2): 40 meq via ORAL
  Filled 2015-10-04 (×2): qty 2

## 2015-10-04 MED ORDER — POTASSIUM CHLORIDE 20 MEQ/15ML (10%) PO SOLN: 30.0000 meq | Freq: Every day | ORAL | Status: DC

## 2015-10-04 MED ORDER — POTASSIUM CHLORIDE 10 MEQ/100ML IV SOLN
10.0000 meq | INTRAVENOUS | Status: AC
  Administered 2015-10-04 (×3): 10 meq via INTRAVENOUS
  Filled 2015-10-04 (×3): qty 100

## 2015-10-04 MED ORDER — POTASSIUM CHLORIDE 20 MEQ/15ML (10%) PO SOLN
20.0000 meq | Freq: Once | ORAL | Status: AC
  Administered 2015-10-04: 20 meq via ORAL
  Filled 2015-10-04: qty 15

## 2015-10-04 NOTE — Progress Notes (Signed)
[facility] Progress Note  Patient: Luis Byrd ZOX:[id number]   PCP: Marguarite Arbour, MD DOB: [birth year]   DOA: 10/01/2015   DOS: 10/04/2015   Date of Service: the patient was seen and examined on 10/04/2015  Subjective: Denies any acute complaint denies any significant weakness or chest pain or abdominal pain. Nutrition: Tolerating oral diet  Brief hospital course: Patient was admitted on 10/01/2015, with complaint of right eye pain with vision loss in both eyes, was found to have bilateral optic neuritis. The patient is transferred here from [facility] for plasmapheresis. Currently further plan is continue steroids to complete a 5 day treatment and also continue plasmapheresis.  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 Appreciate input. Continue steroids for a total of 5 days with high dose. Plasmapheresis 1 out of 5 on 10/02/2015.  2. Essential hypertension. Continue home blood pressure medications. Blood pressure stable. Discontinue hydrochlorothiazide while the patient is receiving IV fluids  3. Dyslipidemia. Continue statin.  4. A. fib. Chronic anticoagulation. [place] Vasc 5 Continue warfarin, currently rate controlled  5. Chronic kidney disease stage II. Renal function stable continue to monitor.  6. Hypokalemia. Replacing orally. Discontinue hydrochlorothiazide will recheck BMP in the morning  Pain management: When necessary Tylenol, continue home Percocet Activity: physical therapy home health Bowel regimen: last BM 10/03/2015 Diet: Regular diet DVT Prophylaxis: on therapeutic anticoagulation.  Advance goals of care discussion: Full code  Family Communication: no family was present at bedside, at the time of interview.   Disposition:  Discharge to home Expected discharge date: 10/07/2015 after completion of TPE  Consultants: Neurology Procedures: TC plasmapheresis  Antibiotics: Anti-infectives    Start     Dose/Rate  Route Frequency Ordered Stop   10/04/15 1600  fluconazole (DIFLUCAN) tablet 100 mg     100 mg Oral Daily 10/04/15 1536          Intake/Output Summary (Last 24 hours) at 10/04/15 2034 Last data filed at 10/04/15 1610  Gross per 24 hour  Intake   2240 ml  Output   2945 ml  Net   -705 ml   Filed Weights   10/02/15 0534 10/02/15 0845 10/02/15 1037  Weight: 86.41 kg (190 lb 8 oz) 85.8 kg (189 lb 2.5 oz) 86.2 kg (190 lb 0.6 oz)    Objective: Physical Exam: Filed Vitals:   10/04/15 1815 10/04/15 1830 10/04/15 1845 10/04/15 1855  BP: 140/62 138/62 144/44 127/65  Pulse: 98 87 88 86  Temp: 98.5 F (36.9 C) 99.2 F (37.3 C) 97.9 F (36.6 C) 98.1 F (36.7 C)  TempSrc: Oral Oral Oral Oral  Resp: Height:      Weight:      SpO2:        General: Alert, Awake and Oriented to Time, Place and Person. Appear in mild distress Eyes:APD left eye Conjunctiva normal ENT: Oral Mucosa clear moist. Neck: no JVD, noAbnormal Mass Or lumps Cardiovascular: S1 and S2 Present, no Murmur, Peripheral Pulses Present Respiratory: Bilateral Air entry equal and Decreased,  Clear to Auscultation, no Crackles, no wheezes Abdomen: Bowel Sound present, Soft and no tenderness Skin: redness no, no Rash  Extremities: no Pedal edema, no calf tenderness Neurologic: Grossly no focal neuro deficit.Bilaterally Equal motor strength Blind in the left eye  Data Reviewed: CBC:  Recent Labs Lab 10/02/15 0608 10/02/15 0853 10/03/15 0845 10/04/15 0415  WBC 6.2  --  5.5 7.3  NEUTROABS  --   --  4.7  --  HGB 12.8* 14.6 13.8 14.1  HCT 37.6* 43.0 40.5 41.0  MCV 87.6  --  87.1 85.2  PLT 154  --  140* 125*   Basic Metabolic Panel:  Recent Labs Lab 09/28/15 0922 09/28/15 1446  09/29/15 0420 10/02/15 0608 10/02/15 0853 10/03/15 0845 10/04/15 0415 10/04/15 1500  NA 138  --   --  137 140 141 142 141  --   K 2.8*  --   < > 3.8 3.2* 3.0* 3.1* 2.6* 3.2*  CL 98*  --   --  100* 105 100* 104 103   --   CO2 32  --   --  25 24  --  25 27  --   GLUCOSE 132*  --   --  166* 133* 155* 247* 147*  --   BUN 25*  --   --  28* 33* 34* 28* 28*  --   CREATININE 1.42*  --   --  1.25* 1.09 1.00 1.12 1.03  --   CALCIUM 9.2  --   --  8.7* 8.2*  --  7.9* 7.9*  --   MG  --  1.8  --  1.8  --   --  2.4 2.3  --   < > = values in this interval not displayed.  Liver Function Tests:  Recent Labs Lab 10/02/15 0608 10/03/15 0845  AST 19 20  ALT 23 18  ALKPHOS 32* 22*  BILITOT 0.9 1.0  PROT 5.6* 5.2*  ALBUMIN 3.0* 3.5   No results for input(s): LIPASE, AMYLASE in the last 168 hours. No results for input(s): AMMONIA in the last 168 hours. Coagulation Profile:  Recent Labs Lab 09/30/15 0548 10/01/15 0428 10/02/15 0608 10/03/15 0845 10/04/15 0415  INR 1.50 1.66 2.24* 3.52* 3.16*   Cardiac Enzymes: No results for input(s): CKTOTAL, CKMB, CKMBINDEX, TROPONINI in the last 168 hours. BNP (last 3 results) No results for input(s): PROBNP in the last 8760 hours.  CBG:  Recent Labs Lab 09/28/15 0745  GLUCAP 109*    Studies: No results found.   Scheduled Meds: . amLODipine  10 mg Oral BID  . calcium gluconate IVPB  4 g Intravenous Once  . citrate dextrose      . digoxin  125 mcg Oral Daily  . fluconazole  100 mg Oral Daily  . heparin  1,000 Units Intracatheter Once  . heparin  3,000 Units Intravenous Once  . potassium chloride  40 mEq Oral Daily  . potassium chloride  40 mEq Oral Once  . Warfarin - Pharmacist Dosing Inpatient   Does not apply q1800   Continuous Infusions: . citrate dextrose     PRN Meds: acetaminophen **OR** acetaminophen, acetaminophen, bisacodyl, diphenhydrAMINE, HYDROcodone-acetaminophen, ondansetron **OR** ondansetron (ZOFRAN) IV, senna-docusate, sodium chloride flush, traZODone  Time spent: 30 minutes  Author: Lynden OxfordPranav Syrus Nakama, MD [facility] Pager: [phone] 10/04/2015 8:34 PM  If 7PM-7AM, please contact night-coverage at [email or web address], password  [email or web address]

## 2015-10-04 NOTE — Progress Notes (Signed)
ANTICOAGULATION CONSULT NOTE - Follow Up Consult  Pharmacy Consult:  Coumadin Indication: atrial fibrillation  No Known Allergies  Patient Measurements: Height: 5\' 9"  (175.3 cm) Weight: 190 lb 0.6 oz (86.2 kg) IBW/kg (Calculated) : 70.7  Vital Signs: Temp: 98 F (36.7 C) (05/13 0942) Temp Source: Oral (05/13 0942) BP: 157/63 mmHg (05/13 0942) Pulse Rate: 62 (05/13 0942)  Labs:  Recent Labs  10/02/15 0608 10/02/15 0853 10/03/15 0845 10/04/15 0415  HGB 12.8* 14.6 13.8 14.1  HCT 37.6* 43.0 40.5 41.0  PLT 154  --  140* 125*  LABPROT 24.6*  --  34.5* 31.8*  INR 2.24*  --  3.52* 3.16*  CREATININE 1.09 1.00 1.12 1.03   Estimated Creatinine Clearance: 66.4 mL/min (by C-G formula based on Cr of 1.03).     Assessment: [age] YOM with history of Afib on Coumadin PTA.  Patient's INR is supra-therapeutic but trending down.  No bleeding reported.   Goal of Therapy:  INR 2-3   Plan:  - Continue to hold Coumadin for one more day - Daily PT / INR    Yadriel Kerrigan D. Laney Potashang, PharmD, BCPS Pager:  [phone] 10/04/2015, 2:21 PM

## 2015-10-04 NOTE — Progress Notes (Signed)
CRITICAL VALUE ALERT  Critical value received:   K + level  Of  2.6  Date of notification: 10/04/15  Time of notification: 0703  Critical value read back yes  Nurse who received alert:  Azzie RoupEsther Elleana Stillson  MD notified (1st page):  Dr Kilpatric  Time of first page:  0707  MD notified (2nd page):  Time of second page:  Responding MD: Dr Gardiner FantiKilpatric  Time MD responded:  [phone]

## 2015-10-05 LAB — BLOOD GAS, ARTERIAL
ACID-BASE DEFICIT: 0.6 mmol/L (ref 0.0–2.0)
Bicarbonate: 22.6 mEq/L (ref 20.0–24.0)
DRAWN BY: 404151
FIO2: 0.21
O2 SAT: 95.9 %
PATIENT TEMPERATURE: 98.6
TCO2: 23.6 mmol/L (ref 0–100)
pCO2 arterial: 31.3 mmHg — ABNORMAL LOW (ref 35.0–45.0)
pH, Arterial: 7.473 — ABNORMAL HIGH (ref 7.350–7.450)
pO2, Arterial: 75.3 mmHg — ABNORMAL LOW (ref 80.0–100.0)

## 2015-10-05 LAB — CBC
HCT: 40.6 % (ref 39.0–52.0)
HEMOGLOBIN: 14.3 g/dL (ref 13.0–17.0)
MCH: 30.2 pg (ref 26.0–34.0)
MCHC: 35.2 g/dL (ref 30.0–36.0)
MCV: 85.7 fL (ref 78.0–100.0)
PLATELETS: 129 10*3/uL — AB (ref 150–400)
RBC: 4.74 MIL/uL (ref 4.22–5.81)
RDW: 14 % (ref 11.5–15.5)
WBC: 6.6 10*3/uL (ref 4.0–10.5)

## 2015-10-05 LAB — PROTIME-INR
INR: 3.41 — ABNORMAL HIGH (ref 0.00–1.49)
Prothrombin Time: 33.7 seconds — ABNORMAL HIGH (ref 11.6–15.2)

## 2015-10-05 LAB — BASIC METABOLIC PANEL
ANION GAP: 7 (ref 5–15)
BUN: 22 mg/dL — ABNORMAL HIGH (ref 6–20)
CALCIUM: 7.5 mg/dL — AB (ref 8.9–10.3)
CO2: 26 mmol/L (ref 22–32)
CREATININE: 0.95 mg/dL (ref 0.61–1.24)
Chloride: 107 mmol/L (ref 101–111)
Glucose, Bld: 112 mg/dL — ABNORMAL HIGH (ref 65–99)
Potassium: 2.9 mmol/L — ABNORMAL LOW (ref 3.5–5.1)
SODIUM: 140 mmol/L (ref 135–145)

## 2015-10-05 LAB — NA AND K (SODIUM & POTASSIUM), RAND UR
POTASSIUM UR: 30 mmol/L
Sodium, Ur: 110 mmol/L

## 2015-10-05 LAB — GLUCOSE, CAPILLARY: GLUCOSE-CAPILLARY: 125 mg/dL — AB (ref 65–99)

## 2015-10-05 LAB — MAGNESIUM: MAGNESIUM: 1.9 mg/dL (ref 1.7–2.4)

## 2015-10-05 LAB — CREATININE, URINE, RANDOM: CREATININE, URINE: 52.92 mg/dL

## 2015-10-05 MED ORDER — POLYETHYLENE GLYCOL 3350 17 G PO PACK
17.0000 g | PACK | Freq: Every day | ORAL | Status: DC
  Administered 2015-10-05 – 2015-10-15 (×11): 17 g via ORAL
  Filled 2015-10-05 (×11): qty 1

## 2015-10-05 MED ORDER — POTASSIUM CHLORIDE CRYS ER 20 MEQ PO TBCR
40.0000 meq | EXTENDED_RELEASE_TABLET | Freq: Once | ORAL | Status: AC
  Administered 2015-10-05: 40 meq via ORAL
  Filled 2015-10-05: qty 2

## 2015-10-05 NOTE — Progress Notes (Signed)
Interval History:                                                                                                                      Luis Byrd is an 75 y.o. male patient with  bilateral optic neuritis, complete blindness. He had no response to IV steroids, currently on plasmapheresis. No significant change in his vision. He is barely able to see hand movement through his right eye when held very close to his eye. No other new neurological symptoms.     Past Medical History: Past Medical History  Diagnosis Date  . Hypertension   . Arthritis   . Chronic a-fib (HCC)   . CHF (congestive heart failure) (HCC)   . HLD (hyperlipidemia)   . BPH (benign prostatic hyperplasia)     Past Surgical History  Procedure Laterality Date  . Hernia repair    . Knee surgery      Family History: Family History  Problem Relation Age of Onset  . Heart attack Father   . Cancer Mother     Social History:   reports that he has quit smoking. He does not have any smokeless tobacco history on file. He reports that he does not drink alcohol or use illicit drugs.  Allergies:  No Known Allergies   Medications:                                                                                                                         Current facility-administered medications:  .  acetaminophen (TYLENOL) tablet 650 mg, 650 mg, Oral, Q6H PRN, 650 mg at 10/01/15 2001 **OR** acetaminophen (TYLENOL) suppository 650 mg, 650 mg, Rectal, Q6H PRN, Marcos Eke, PA-C .  acetaminophen (TYLENOL) tablet 650 mg, 650 mg, Oral, Q4H PRN, Rejeana Brock, MD .  amLODipine (NORVASC) tablet 10 mg, 10 mg, Oral, BID, Marcos Eke, PA-C, 10 mg at 10/05/15 0955 .  bisacodyl (DULCOLAX) suppository 10 mg, 10 mg, Rectal, Daily PRN, Marcos Eke, PA-C, 10 mg at 10/03/15 0904 .  citrate dextrose (ACD-A anticoagulant) solution 500 mL, 500 mL, Intravenous, Continuous, Rejeana Brock, MD .  digoxin (LANOXIN) tablet  125 mcg, 125 mcg, Oral, Daily, Marcos Eke, PA-C, 125 mcg at 10/05/15 0955 .  diphenhydrAMINE (BENADRYL) capsule 25 mg, 25 mg, Oral, Q6H PRN, Rejeana Brock, MD .  fluconazole (DIFLUCAN) tablet 100 mg, 100 mg, Oral, Daily, Rolly Salter, MD, 100 mg at 10/05/15 0955 .  heparin injection 1,000 Units, 1,000 Units, Intracatheter, Once, Rejeana BrockMcNeill P Kirkpatrick, MD .  heparin injection 3,000 Units, 3,000 Units, Intravenous, Once, Jeanella CrazeBrandi L Ollis, NP .  HYDROcodone-acetaminophen (NORCO/VICODIN) 5-325 MG per tablet 1-2 tablet, 1-2 tablet, Oral, Q4H PRN, Marcos EkeSara E Wertman, PA-C .  ondansetron (ZOFRAN) tablet 4 mg, 4 mg, Oral, Q6H PRN **OR** ondansetron (ZOFRAN) injection 4 mg, 4 mg, Intravenous, Q6H PRN, Marcos EkeSara E Wertman, PA-C .  polyethylene glycol (MIRALAX / GLYCOLAX) packet 17 g, 17 g, Oral, Daily, Rolly SalterPranav M Patel, MD, 17 g at 10/05/15 1710 .  potassium chloride SA (K-DUR,KLOR-CON) CR tablet 40 mEq, 40 mEq, Oral, Daily, Rolly SalterPranav M Patel, MD, 40 mEq at 10/05/15 0955 .  senna-docusate (Senokot-S) tablet 1 tablet, 1 tablet, Oral, QHS PRN, Marcos EkeSara E Wertman, PA-C, 1 tablet at 10/03/15 0904 .  sodium chloride flush (NS) 0.9 % injection 10-40 mL, 10-40 mL, Intracatheter, PRN, Rolly SalterPranav M Patel, MD, 10 mL at 10/05/15 0416 .  traZODone (DESYREL) tablet 25 mg, 25 mg, Oral, QHS PRN, Marcos EkeSara E Wertman, PA-C, 25 mg at 10/04/15 2215 .  Warfarin - Pharmacist Dosing Inpatient, , Does not apply, q1800, Marquita PalmsCorey M Ball, [facility] CenterRPH, Stopped at 10/03/15 1800   Neurologic Examination:                                                                                                     Today's Vitals   10/05/15 0500 10/05/15 0559 10/05/15 0800 10/05/15 1358  BP:  142/82  120/53  Pulse:    93  Temp:  98 F (36.7 C)  98.6 F (37 C)  TempSrc:  Oral  Oral  Resp:  18  20  Height:      Weight: 84.7 kg (186 lb 11.7 oz)     SpO2:  96%  97%  PainSc:   0-No pain     Evaluation of higher integrative functions including: Level of alertness:  Alert,  Oriented to time, place and person Speech: fluent, no evidence of dysarthria or aphasia noted.  Test the following cranial nerves: 3-12 grossly intact. Severely impaired visual laxity in both eyes, unable to detect hand movement to left eye, able to barely detect hand movement through the right eye and held very close to the eye  Motor examination: full 5/5 motor strength in all 4 extremities   Lab Results: Basic Metabolic Panel:  Recent Labs Lab 09/29/15 0420 10/02/15 0608 10/02/15 0853 10/03/15 0845 10/04/15 0415 10/04/15 1500 10/05/15 0416  NA 137 140 141 142 141  --  140  K 3.8 3.2* 3.0* 3.1* 2.6* 3.2* 2.9*  CL 100* 105 100* 104 103  --  107  CO2 25 24  --  25 27  --  26  GLUCOSE 166* 133* 155* 247* 147*  --  112*  BUN 28* 33* 34* 28* 28*  --  22*  CREATININE 1.25* 1.09 1.00 1.12 1.03  --  0.95  CALCIUM 8.7* 8.2*  --  7.9* 7.9*  --  7.5*  MG 1.8  --   --  2.4 2.3  --  1.9  Liver Function Tests:  Recent Labs Lab 10/02/15 0608 10/03/15 0845  AST 19 20  ALT 23 18  ALKPHOS 32* 22*  BILITOT 0.9 1.0  PROT 5.6* 5.2*  ALBUMIN 3.0* 3.5   No results for input(s): LIPASE, AMYLASE in the last 168 hours. No results for input(s): AMMONIA in the last 168 hours.  CBC:  Recent Labs Lab 10/02/15 0608 10/02/15 0853 10/03/15 0845 10/04/15 0415 10/05/15 0416  WBC 6.2  --  5.5 7.3 6.6  NEUTROABS  --   --  4.7  --   --   HGB 12.8* 14.6 13.8 14.1 14.3  HCT 37.6* 43.0 40.5 41.0 40.6  MCV 87.6  --  87.1 85.2 85.7  PLT 154  --  140* 125* 129*    Cardiac Enzymes: No results for input(s): CKTOTAL, CKMB, CKMBINDEX, TROPONINI in the last 168 hours.  Lipid Panel: No results for input(s): CHOL, TRIG, HDL, CHOLHDL, VLDL, LDLCALC in the last 168 hours.  CBG: No results for input(s): GLUCAP in the last 168 hours.  Microbiology: Results for orders placed or performed in visit on 01/02/13  Urine culture     Status: None   Collection Time: 01/02/13 10:14 AM  Result  Value Ref Range Status   Micro Text Report   Final       SOURCE: CLEA CATCH    ORGANISM 1                1000 CFU/ML GRAM NEGATIVE ROD   ANTIBIOTIC                                                      MRSA PCR Screening     Status: None   Collection Time: 01/02/13 10:25 AM  Result Value Ref Range Status   Micro Text Report   Final       COMMENT                   NEGATIVE - MRSA target DNA not detected   ANTIBIOTIC                                                        Imaging: Mr Birdie Hopes Wo/w Cm  10/03/2015  CLINICAL DATA:  Steroid resistant optic neuritis, here for plasmapheresis. Acute vision loss and eye pain, headache and dizziness Sep 27, 2015 after central retina artery occlusion. EXAM: MRI OF THE ORBITS WITHOUT AND WITH CONTRAST TECHNIQUE: Multiplanar, multisequence MR imaging of the orbits was performed both before and after the administration of intravenous contrast. CONTRAST:  20mL MULTIHANCE GADOBENATE DIMEGLUMINE 529 MG/ML IV SOLN COMPARISON:  MRI head Sep 28, 2015 FINDINGS: Diffusely bright T2 signal along the LEFT optic nerve, the level of the LEFT optic chiasm. Punctate focus of T2 bright signal within LEFT optic disc. Ocular globes intact, homogeneous T2 bright signal. Lenses are located. No abnormal enhancement of the optic nerves or orbital contents though the post gadolinium sequences are mildly motion degraded. Normal appearance of the extraocular muscles. Superior ophthalmic veins are not enlarged. Preservation of the orbital fat. Trace paranasal sinus mucosal thickening. Imaged mastoid air cells are well aerated. IMPRESSION: Severely edematous LEFT  optic nerve to level the optic chiasm without enhancement. Findings favor optic nerve infarct, less likely demyelination. Electronically Signed   By: Awilda Metro M.D.   On: 10/03/2015 01:28    Assessment and plan:   Luis Byrd is an 75 y.o. male patient with Bilateral optic neuritis, currently undergoing  plasmapheresis. No improvement in his vision so far. Tolerating plasmapheresis well. No other new neurological symptoms. Patient remains optimistic.  We'll follow up

## 2015-10-05 NOTE — Progress Notes (Signed)
[facility] Progress Note  Patient: Luis RamsayLorin B Remsen WUJ:[id number]   PCP: Marguarite ArbourSPARKS,JEFFREY D, MD DOB: [birth year]   DOA: 10/01/2015   DOS: 10/05/2015   Date of Service: the patient was seen and examined on 10/05/2015  Subjective: Denies any acute worsening. Denies any acute complaint. No chest pain and abdominal pain. No diarrhea. As per the nursing patient has significant redness of the buttocks. Nutrition: Tolerating oral diet  Brief hospital course: Patient was admitted on 10/01/2015, with complaint of right eye pain with vision loss in both eyes, was found to have bilateral optic neuritis. [facility] Currently further plan is continue steroids to complete a 5 day treatment and also continue plasmapheresis.  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 Appreciate input. Completed five-day course of 1 g steroids without taper on 10/03/2015 Plasmapheresis 2 out of 5 on 10/04/2015.  2. Essential hypertension. Continue home blood pressure medications. Blood pressure stable.  Discontinue hydrochlorothiazide while the patient is receiving IV fluids  3. Dyslipidemia. Continue statin.  4. A. fib. Chronic anticoagulation. [place] Vasc 5 Continue warfarin, currently rate controlled  5. Chronic kidney disease stage II. Renal function stable continue to monitor.  6. Hypokalemia. Replacing orally. Discontinue hydrochlorothiazide will recheck BMP in the morning. Patient has chronic hypokalemia and has been taking 40 mg potassium on a daily basis.  7. Oral thrush. Improving. Continue Diflucan.  Pain management: When necessary Tylenol, continue home Percocet Activity: physical therapy home health Bowel regimen: last BM 10/03/2015 MiraLAX added Diet: Regular diet DVT Prophylaxis: on therapeutic anticoagulation.  Advance goals of care discussion: Full code  Family Communication: no family was present at  bedside, at the time of interview.   Disposition:  Discharge to home Expected discharge date: after completion of TPE  Consultants: Neurology Procedures: TC plasmapheresis  Antibiotics: Anti-infectives    Start     Dose/Rate Route Frequency Ordered Stop   10/04/15 1600  fluconazole (DIFLUCAN) tablet 100 mg     100 mg Oral Daily 10/04/15 1536          Intake/Output Summary (Last 24 hours) at 10/05/15 1514 Last data filed at 10/05/15 1510  Gross per 24 hour  Intake   1235 ml  Output   1350 ml  Net   -115 ml   Filed Weights   10/02/15 0845 10/02/15 1037 10/05/15 0500  Weight: 85.8 kg (189 lb 2.5 oz) 86.2 kg (190 lb 0.6 oz) 84.7 kg (186 lb 11.7 oz)    Objective: Physical Exam: Filed Vitals:   10/05/15 0139 10/05/15 0500 10/05/15 0559 10/05/15 1358  BP: 118/84  142/82 120/53  Pulse: 72   93  Temp: 98.9 F (37.2 C)  98 F (36.7 C) 98.6 F (37 C)  TempSrc: Oral  Oral Oral  Resp: 18  18 20   Height:      Weight:  84.7 kg (186 lb 11.7 oz)    SpO2: 97%  96% 97%    General: Alert, Awake and Oriented to Time, Place and Person. Appear in mild distress Eyes:APD left eye Conjunctiva normal ENT: Oral Mucosa clear moist. Neck: no JVD, noAbnormal Mass Or lumps Cardiovascular: S1 and S2 Present, no Murmur, Peripheral Pulses Present Respiratory: Bilateral Air entry equal and Decreased,  Clear to Auscultation, no Crackles, no wheezes Abdomen: Bowel Sound present, Soft and no tenderness Skin: redness no, no Rash  Extremities: no Pedal edema, no calf tenderness Neurologic: Grossly no focal neuro deficit.Bilaterally Equal motor strength  Blind in the left eye  Data Reviewed: CBC:  Recent Labs Lab 10/02/15 0608 10/02/15 0853 10/03/15 0845 10/04/15 0415 10/05/15 0416  WBC 6.2  --  5.5 7.3 6.6  NEUTROABS  --   --  4.7  --   --   HGB 12.8* 14.6 13.8 14.1 14.3  HCT 37.6* 43.0 40.5 41.0 40.6  MCV 87.6  --  87.1 85.2 85.7  PLT 154  --  140* 125* 129*   Basic Metabolic  Panel:  Recent Labs Lab 09/29/15 0420 10/02/15 0608 10/02/15 0853 10/03/15 0845 10/04/15 0415 10/04/15 1500 10/05/15 0416  NA 137 140 141 142 141  --  140  K 3.8 3.2* 3.0* 3.1* 2.6* 3.2* 2.9*  CL 100* 105 100* 104 103  --  107  CO2 25 24  --  25 27  --  26  GLUCOSE 166* 133* 155* 247* 147*  --  112*  BUN 28* 33* 34* 28* 28*  --  22*  CREATININE 1.25* 1.09 1.00 1.12 1.03  --  0.95  CALCIUM 8.7* 8.2*  --  7.9* 7.9*  --  7.5*  MG 1.8  --   --  2.4 2.3  --  1.9    Liver Function Tests:  Recent Labs Lab 10/02/15 0608 10/03/15 0845  AST 19 20  ALT 23 18  ALKPHOS 32* 22*  BILITOT 0.9 1.0  PROT 5.6* 5.2*  ALBUMIN 3.0* 3.5   No results for input(s): LIPASE, AMYLASE in the last 168 hours. No results for input(s): AMMONIA in the last 168 hours. Coagulation Profile:  Recent Labs Lab 10/01/15 0428 10/02/15 0608 10/03/15 0845 10/04/15 0415 10/05/15 0416  INR 1.66 2.24* 3.52* 3.16* 3.41*   Cardiac Enzymes: No results for input(s): CKTOTAL, CKMB, CKMBINDEX, TROPONINI in the last 168 hours. BNP (last 3 results) No results for input(s): PROBNP in the last 8760 hours.  CBG: No results for input(s): GLUCAP in the last 168 hours.  Studies: No results found.   Scheduled Meds: . amLODipine  10 mg Oral BID  . digoxin  125 mcg Oral Daily  . fluconazole  100 mg Oral Daily  . heparin  1,000 Units Intracatheter Once  . heparin  3,000 Units Intravenous Once  . potassium chloride  40 mEq Oral Daily  . Warfarin - Pharmacist Dosing Inpatient   Does not apply q1800   Continuous Infusions: . citrate dextrose     PRN Meds: acetaminophen **OR** acetaminophen, acetaminophen, bisacodyl, diphenhydrAMINE, HYDROcodone-acetaminophen, ondansetron **OR** ondansetron (ZOFRAN) IV, senna-docusate, sodium chloride flush, traZODone  Time spent: 30 minutes  Author: Lynden Oxford, MD [facility] Pager: [phone] 10/05/2015 3:14 PM  If 7PM-7AM, please contact night-coverage at  [email or web address], password [facility]

## 2015-10-05 NOTE — Progress Notes (Signed)
ANTICOAGULATION CONSULT NOTE - Follow Up Consult  Pharmacy Consult:  Coumadin Indication: atrial fibrillation  No Known Allergies  Patient Measurements: Height: 5\' 9"  (175.3 cm) Weight: 186 lb 11.7 oz (84.7 kg) IBW/kg (Calculated) : 70.7  Vital Signs: Temp: 98 F (36.7 C) (05/14 0559) Temp Source: Oral (05/14 0559) BP: 142/82 mmHg (05/14 0559) Pulse Rate: 72 (05/14 0139)  Labs:  Recent Labs  10/03/15 0845 10/04/15 0415 10/05/15 0416  HGB 13.8 14.1 14.3  HCT 40.5 41.0 40.6  PLT 140* 125* 129*  LABPROT 34.5* 31.8* 33.7*  INR 3.52* 3.16* 3.41*  CREATININE 1.12 1.03 0.95   Estimated Creatinine Clearance: 66.2 mL/min (by C-G formula based on Cr of 0.95).     Assessment: [age] YOM with history of Afib on Coumadin PTA.  Patient's INR remain supra-therapeutic and trended up, likely due to DDI with fluconazole.  No bleeding reported.   Goal of Therapy:  INR 2-3   Plan:  - Continue to hold Coumadin - Daily PT / INR - F/U K+ (on digoxin), fluconazole LOT   Camerin Jimenez D. Laney Potashang, PharmD, BCPS Pager:  [phone] 10/05/2015, 1:19 PM

## 2015-10-06 DIAGNOSIS — E876 Hypokalemia: Secondary | ICD-10-CM

## 2015-10-06 LAB — POCT I-STAT, CHEM 8
BUN: 25 mg/dL — AB (ref 6–20)
CREATININE: 0.8 mg/dL (ref 0.61–1.24)
Calcium, Ion: 1.05 mmol/L — ABNORMAL LOW (ref 1.13–1.30)
Chloride: 99 mmol/L — ABNORMAL LOW (ref 101–111)
Glucose, Bld: 115 mg/dL — ABNORMAL HIGH (ref 65–99)
HEMATOCRIT: 41 % (ref 39.0–52.0)
HEMOGLOBIN: 13.9 g/dL (ref 13.0–17.0)
POTASSIUM: 3 mmol/L — AB (ref 3.5–5.1)
Sodium: 141 mmol/L (ref 135–145)
TCO2: 24 mmol/L (ref 0–100)

## 2015-10-06 LAB — GLUCOSE, CAPILLARY
GLUCOSE-CAPILLARY: 94 mg/dL (ref 65–99)
GLUCOSE-CAPILLARY: 99 mg/dL (ref 65–99)
Glucose-Capillary: 89 mg/dL (ref 65–99)
Glucose-Capillary: 130 mg/dL — ABNORMAL HIGH (ref 65–99)

## 2015-10-06 LAB — PROTIME-INR
INR: 1.91 — AB (ref 0.00–1.49)
PROTHROMBIN TIME: 21.8 s — AB (ref 11.6–15.2)

## 2015-10-06 LAB — CBC
HCT: 40.7 % (ref 39.0–52.0)
HEMOGLOBIN: 13.7 g/dL (ref 13.0–17.0)
MCH: 28.6 pg (ref 26.0–34.0)
MCHC: 33.7 g/dL (ref 30.0–36.0)
MCV: 85 fL (ref 78.0–100.0)
PLATELETS: 121 10*3/uL — AB (ref 150–400)
RBC: 4.79 MIL/uL (ref 4.22–5.81)
RDW: 13.8 % (ref 11.5–15.5)
WBC: 6.1 10*3/uL (ref 4.0–10.5)

## 2015-10-06 LAB — MAGNESIUM: MAGNESIUM: 1.8 mg/dL (ref 1.7–2.4)

## 2015-10-06 LAB — BASIC METABOLIC PANEL
ANION GAP: 6 (ref 5–15)
BUN: 17 mg/dL (ref 6–20)
CALCIUM: 7.7 mg/dL — AB (ref 8.9–10.3)
CO2: 27 mmol/L (ref 22–32)
CREATININE: 1 mg/dL (ref 0.61–1.24)
Chloride: 104 mmol/L (ref 101–111)
GLUCOSE: 108 mg/dL — AB (ref 65–99)
Potassium: 3.2 mmol/L — ABNORMAL LOW (ref 3.5–5.1)
Sodium: 137 mmol/L (ref 135–145)

## 2015-10-06 LAB — ANGIOTENSIN CONVERTING ENZYME

## 2015-10-06 MED ORDER — ACD FORMULA A 0.73-2.45-2.2 GM/100ML VI SOLN
Status: AC
  Administered 2015-10-06: 500 mL via INTRAVENOUS
  Filled 2015-10-06: qty 500

## 2015-10-06 MED ORDER — ACD FORMULA A 0.73-2.45-2.2 GM/100ML VI SOLN: 500.0000 mL | Status: DC

## 2015-10-06 MED ORDER — SODIUM CHLORIDE 0.9 % IV SOLN
4.0000 g | Freq: Once | INTRAVENOUS | Status: AC
  Administered 2015-10-06: 4 g via INTRAVENOUS
  Filled 2015-10-06: qty 40

## 2015-10-06 MED ORDER — HYDROCODONE-ACETAMINOPHEN 5-325 MG PO TABS
ORAL_TABLET | ORAL | Status: AC
  Filled 2015-10-06: qty 1

## 2015-10-06 MED ORDER — CALCIUM CARBONATE ANTACID 500 MG PO CHEW
2.0000 | CHEWABLE_TABLET | ORAL | Status: AC
  Administered 2015-10-06: 400 mg via ORAL
  Filled 2015-10-06: qty 2

## 2015-10-06 MED ORDER — ACETAMINOPHEN 325 MG PO TABS: 650.0000 mg | ORAL_TABLET | ORAL | Status: DC | PRN

## 2015-10-06 MED ORDER — WARFARIN SODIUM 4 MG PO TABS
4.0000 mg | ORAL_TABLET | Freq: Once | ORAL | Status: AC
  Administered 2015-10-06: 4 mg via ORAL
  Filled 2015-10-06 (×2): qty 1

## 2015-10-06 MED ORDER — POTASSIUM CHLORIDE CRYS ER 20 MEQ PO TBCR
40.0000 meq | EXTENDED_RELEASE_TABLET | Freq: Two times a day (BID) | ORAL | Status: DC
  Administered 2015-10-06 – 2015-10-15 (×19): 40 meq via ORAL
  Filled 2015-10-06 (×19): qty 2

## 2015-10-06 MED ORDER — DIPHENHYDRAMINE HCL 25 MG PO CAPS: 25.0000 mg | ORAL_CAPSULE | Freq: Four times a day (QID) | ORAL | Status: DC | PRN

## 2015-10-06 MED ORDER — SODIUM CHLORIDE 0.9 % IV SOLN
INTRAVENOUS | Status: AC
  Administered 2015-10-06 (×3): via INTRAVENOUS_CENTRAL
  Filled 2015-10-06 (×3): qty 200

## 2015-10-06 MED ORDER — ACD FORMULA A 0.73-2.45-2.2 GM/100ML VI SOLN
Status: AC
  Filled 2015-10-06: qty 1000

## 2015-10-06 MED ORDER — SODIUM CHLORIDE 0.9 % IV SOLN
4.0000 g | Freq: Once | INTRAVENOUS | Status: DC
  Filled 2015-10-06: qty 40

## 2015-10-06 MED ORDER — SODIUM CHLORIDE 0.9 % IV SOLN: INTRAVENOUS | Status: DC

## 2015-10-06 MED ORDER — HEPARIN SODIUM (PORCINE) 1000 UNIT/ML IJ SOLN: 1000.0000 [IU] | Freq: Once | INTRAMUSCULAR | Status: DC

## 2015-10-06 NOTE — Progress Notes (Signed)
[facility] Progress Note  Patient: Luis Byrd ZOX:[id number]   PCP: Marguarite Arbour, MD DOB: Jan 16, 1941   DOA: 10/01/2015   DOS: 10/06/2015   Date of Service: the patient was seen and examined on 10/06/2015  Subjective: No acute events overnight. No abdominal pain. Nutrition: Tolerating oral diet  Brief hospital course: Patient was admitted on 10/01/2015, with complaint of right eye pain with vision loss in both eyes, was found to have bilateral optic neuritis. The patient is transferred here from [facility] for plasmapheresis. Currently further plan is  continue plasmapheresis.  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 Appreciate input. Completed five-day course of 1 g steroids without taper on 10/03/2015 Plasmapheresis 2 out of 5 on 10/04/2015.  2. Essential hypertension. Continue home blood pressure medications. Blood pressure stable.  Discontinue hydrochlorothiazide due to hypokalemia  3. Dyslipidemia. Continue statin.  4. A. fib. Chronic anticoagulation. [place] Vasc 5 Continue warfarin, currently rate controlled  5. Chronic kidney disease stage II. Renal function stable continue to monitor.  6. Hypokalemia. Replacing orally. Discontinue hydrochlorothiazide  Daily BMP Patient has chronic hypokalemia and has been taking 40 mg potassium on a daily basis.  7. Oral thrush. Improving. Continue Diflucan.  Pain management: When necessary Tylenol, continue home Percocet Activity: physical therapy home health Bowel regimen: last BM 10/03/2015 MiraLAX added Diet: Regular diet DVT Prophylaxis: on therapeutic anticoagulation.  Advance goals of care discussion: Full code  Family Communication: family was present at bedside, at the time of interview. The pt provided permission to discuss medical plan with the family. Opportunity was given to ask question and all questions were answered satisfactorily.   Disposition:  Discharge to  home Expected discharge date: after completion of TPE  Consultants: Neurology Procedures: TC plasmapheresis  Antibiotics: Anti-infectives    Start     Dose/Rate Route Frequency Ordered Stop   10/04/15 1600  fluconazole (DIFLUCAN) tablet 100 mg     100 mg Oral Daily 10/04/15 1536         Intake/Output Summary (Last 24 hours) at 10/06/15 2006 Last data filed at 10/06/15 1900  Gross per 24 hour  Intake    980 ml  Output    575 ml  Net    405 ml   Filed Weights   10/02/15 1037 10/05/15 0500 10/06/15 0454  Weight: 86.2 kg (190 lb 0.6 oz) 84.7 kg (186 lb 11.7 oz) 83.9 kg (184 lb 15.5 oz)    Objective: Physical Exam: Filed Vitals:   10/06/15 1506 10/06/15 1521 10/06/15 1533 10/06/15 1751  BP: 114/77 116/70 133/73 121/66  Pulse:  89 69 77  Temp: 97.6 F (36.4 C) 97.4 F (36.3 C) 97.6 F (36.4 C) 98 F (36.7 C)  TempSrc: Oral  Oral Oral  Resp: Height:      Weight:      SpO2: 100% 100% 100% 96%    General: Alert, Awake and Oriented to Time, Place and Person. Appear in mild distress Eyes:APD left eye Conjunctiva normal ENT: Oral Mucosa clear moist. Neck: no JVD, noAbnormal Mass Or lumps Cardiovascular: S1 and S2 Present, no Murmur, Peripheral Pulses Present Respiratory: Bilateral Air entry equal and Decreased,  Clear to Auscultation, no Crackles, no wheezes Abdomen: Bowel Sound present, Soft and no tenderness Skin: redness no, no Rash  Extremities: no Pedal edema, no calf tenderness Neurologic: Grossly no focal neuro deficit.Bilaterally Equal motor strength Blind in the left eye   Data Reviewed: CBC:  RecALMALIK WEISSBERGab  10/02/15 [id number]  10/03/15 0845 10/04/15 0415 10/04/15 1708 10/05/15 0416 10/06/15 0530  WBC 6.2  --  5.5 7.3  --  6.6 6.1  NEUTROABS  --   --  4.7  --   --   --   --   HGB 12.8*  < > 13.8 14.1 13.9 14.3 13.7  HCT 37.6*  < > 40.5 41.0 41.0 40.6 40.7  MCV 87.6  --  87.1 85.2  --  85.7 85.0  PLT 154  --  140* 125*  --  129* 121*  < >  = values in this interval not displayed. Basic Metabolic Panel:  Recent Labs Lab 10/02/15 0608  10/03/15 0845 10/04/15 0415 10/04/15 1500 10/04/15 1708 10/05/15 0416 10/06/15 0530  NA 140  < > 142 141  --  141 140 137  K 3.2*  < > 3.1* 2.6* 3.2* 3.0* 2.9* 3.2*  CL 105  < > 104 103  --  99* 107 104  CO2 24  --  25 27  --   --  26 27  GLUCOSE 133*  < > 247* 147*  --  115* 112* 108*  BUN 33*  < > 28* 28*  --  25* 22* 17  CREATININE 1.09  < > 1.12 1.03  --  0.80 0.95 1.00  CALCIUM 8.2*  --  7.9* 7.9*  --   --  7.5* 7.7*  MG  --   --  2.4 2.3  --   --  1.9 1.8  < > = values in this interval not displayed.  Liver Function Tests:  Recent Labs Lab 10/02/15 0608 10/03/15 0845  AST 19 20  ALT 23 18  ALKPHOS 32* 22*  BILITOT 0.9 1.0  PROT 5.6* 5.2*  ALBUMIN 3.0* 3.5   No results for input(s): LIPASE, AMYLASE in the last 168 hours. No results for input(s): AMMONIA in the last 168 hours. Coagulation Profile:  Recent Labs Lab 10/02/15 0608 10/03/15 0845 10/04/15 0415 10/05/15 0416 10/06/15 0530  INR 2.24* 3.52* 3.16* 3.41* 1.91*   Cardiac Enzymes: No results for input(s): CKTOTAL, CKMB, CKMBINDEX, TROPONINI in the last 168 hours. BNP (last 3 results) No results for input(s): PROBNP in the last 8760 hours.  CBG:  Recent Labs Lab 10/05/15 2206 10/06/15 0653 10/06/15 1131 10/06/15 1700  GLUCAP 125* 89 130* 94    Studies: No results found.   Scheduled Meds: . amLODipine  10 mg Oral BID  . calcium carbonate  2 tablet Oral Q3H  . digoxin  125 mcg Oral Daily  . fluconazole  100 mg Oral Daily  . heparin  1,000 Units Intracatheter Once  . heparin  1,000 Units Intracatheter Once  . heparin  3,000 Units Intravenous Once  . polyethylene glycol  17 g Oral Daily  . potassium chloride  40 mEq Oral BID  . Warfarin - Pharmacist Dosing Inpatient   Does not apply q1800   Continuous Infusions: . citrate dextrose     PRN Meds: acetaminophen **OR** acetaminophen,  acetaminophen, bisacodyl, diphenhydrAMINE, HYDROcodone-acetaminophen, ondansetron **OR** ondansetron (ZOFRAN) IV, senna-docusate, sodium chloride flush, traZODone  Time spent: 30 minutes  Author: Lynden OxfordPranav Yurianna Tusing, MD [facility] Pager: [phone] 10/06/2015 8:06 PM  If 7PM-7AM, please contact night-coverage at [email or web address], password [email or web address]

## 2015-10-06 NOTE — Care Management Note (Signed)
Case Management Note  Patient Details  Name: Luis Byrd MRN: [id number] Date of Birth: 01-04-1941  Subjective/Objective:                    Action/Plan: Pt continues with plasmapheresis. CM following for d/c needs.   Expected Discharge Date:                  Expected Discharge Plan:     In-House Referral:     Discharge planning Services     Post Acute Care Choice:    Choice offered to:     DME Arranged:    DME Agency:     HH Arranged:    HH Agency:     Status of Service:  In process, will continue to follow  Medicare Important Message Given:    Date Medicare IM Given:    Medicare IM give by:    Date Additional Medicare IM Given:    Additional Medicare Important Message give by:     If discussed at Long Length of Stay Meetings, dates discussed:    Additional Comments:  Kermit BaloKelli F Marge Vandermeulen, RN 10/06/2015, 2:08 PM

## 2015-10-06 NOTE — Progress Notes (Signed)
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 MRI concerning for inflammatory process, Abnormal signal within the left optic nerve, optic chiasm bilaterally greater on the left and along the optic tracts bilaterally greater on the left. 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 [facility].    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 Lengthy education on strategies for pressure relief- having son get her an alarm clock that goes off every 30 mins, rotating in bed every commercial, performing exercises to relief pressure on buttocks etc. Tolerated gait training with min A for balance and Max verbal directional cues due to visual deficits. Pt worried about discharge plan and not being able to take care of wife. Will follow.   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   Transfers Overall transfer level: Needs assistance Equipment used: None Transfers: Sit to/from Stand Sit to Stand: Supervision         General transfer comment: supervision for safety due to vision impairments; no unsteadiness noted  Ambulation/Gait Ambulation/Gait assistance: Min assist Ambulation Distance (Feet): 300 Feet Assistive device: 1 person hand  held assist (rail) Gait Pattern/deviations: Step-through pattern;Step-to pattern;Shuffle;Drifts right/left Gait velocity: reduced Gait velocity interpretation: Below normal speed for age/gender General Gait Details: Slow, shuffling steps and guarded gait due to visual deficits. Use of rail for support. Max verbal and manual directional cues for visual deficits.    Stairs            Wheelchair Mobility    Modified Rankin (Stroke Patients Only)       Balance Overall balance assessment: Needs assistance Sitting-balance support: Feet supported;No upper extremity supported Sitting balance-Leahy Scale: Normal     Standing balance support: During functional activity Standing balance-Leahy Scale: Good                      Cognition Arousal/Alertness: Awake/alert Behavior During Therapy: WFL for tasks assessed/performed Overall Cognitive Status: Within Functional Limits for tasks assessed                      Exercises Other Exercises Other Exercises: tricep dips in chair x10    General Comments        Pertinent Vitals/Pain Pain Assessment: No/denies pain    Home Living                      Prior Function            PT Goals (current goals can now be found in the care plan section) Progress towards PT goals: Progressing toward goals    Frequency  Min 3X/week    PT Plan Current plan remains appropriate    Co-evaluation  End of Session Equipment Utilized During Treatment: Gait belt Activity Tolerance: Patient tolerated treatment well Patient left: in chair;with call bell/phone within reach;with chair alarm set;with family/visitor present     Time: 6962-9528 PT Time Calculation (min) (ACUTE ONLY): 21 min  Charges:  $Gait Training: 8-22 mins                    G Codes:      Rozann Holts A Yoneko Talerico 10/06/2015, 10:08 AM Mylo Red, PT, DPT [phone]

## 2015-10-06 NOTE — Progress Notes (Signed)
TPE- Pt brought to unit. No complaints today. [birth year] birthday. Vitals stable. No complaints other than "my neck is sore." Medicated for pain. Treatment initiated without issue. 3L exchange ordered. Continue to monitor patient.

## 2015-10-06 NOTE — Progress Notes (Signed)
ANTICOAGULATION CONSULT NOTE - Follow Up Consult  Pharmacy Consult:  Coumadin Indication: atrial fibrillation  No Known Allergies  Patient Measurements: Height: 5\' 9"  (175.3 cm) Weight: 184 lb 15.5 oz (83.9 kg) IBW/kg (Calculated) : 70.7  Vital Signs: Temp: 98.3 F (36.8 C) (05/15 0454) Temp Source: Oral (05/15 0454) BP: 124/65 mmHg (05/15 0454) Pulse Rate: 85 (05/15 0822)  Labs:  Recent Labs  10/04/15 0415 10/04/15 1708 10/05/15 0416 10/06/15 0530  HGB 14.1 13.9 14.3 13.7  HCT 41.0 41.0 40.6 40.7  PLT 125*  --  129* 121*  LABPROT 31.8*  --  33.7* 21.8*  INR 3.16*  --  3.41* 1.91*  CREATININE 1.03 0.80 0.95 1.00   Estimated Creatinine Clearance: 61.9 mL/min (by C-G formula based on Cr of 1).     Assessment: [age] YOM with history of Afib on Coumadin PTA.  Patient's INR is down to 1.91. H/H wnl ,Plt 121k. No bleeding reported.  Home Coumadin dose: 6 mg daily    Goal of Therapy:  INR 2-3   Plan:  - Coumadin 4 mg once due to interaction with fluconazole  - Daily PT / INR - F/U K+ (on digoxin), fluconazole LOT   Luis Byrd, Luis Byrd., BCPS Clinical Pharmacist Pager [phone]

## 2015-10-07 LAB — GLUCOSE, CAPILLARY
GLUCOSE-CAPILLARY: 103 mg/dL — AB (ref 65–99)
Glucose-Capillary: 126 mg/dL — ABNORMAL HIGH (ref 65–99)
Glucose-Capillary: 178 mg/dL — ABNORMAL HIGH (ref 65–99)
Glucose-Capillary: 148 mg/dL — ABNORMAL HIGH (ref 65–99)

## 2015-10-07 LAB — POCT I-STAT, CHEM 8
BUN: 21 mg/dL — AB (ref 6–20)
CALCIUM ION: 1.12 mmol/L — AB (ref 1.13–1.30)
CREATININE: 0.9 mg/dL (ref 0.61–1.24)
Chloride: 98 mmol/L — ABNORMAL LOW (ref 101–111)
GLUCOSE: 105 mg/dL — AB (ref 65–99)
HCT: 41 % (ref 39.0–52.0)
Hemoglobin: 13.9 g/dL (ref 13.0–17.0)
Potassium: 3.5 mmol/L (ref 3.5–5.1)
Sodium: 139 mmol/L (ref 135–145)
TCO2: 23 mmol/L (ref 0–100)

## 2015-10-07 LAB — BASIC METABOLIC PANEL
Anion gap: 7 (ref 5–15)
BUN: 14 mg/dL (ref 6–20)
CO2: 27 mmol/L (ref 22–32)
CREATININE: 0.99 mg/dL (ref 0.61–1.24)
Calcium: 8 mg/dL — ABNORMAL LOW (ref 8.9–10.3)
Chloride: 105 mmol/L (ref 101–111)
GFR calc Af Amer: >60 mL/min (ref >60)
Glucose, Bld: 102 mg/dL — ABNORMAL HIGH (ref 65–99)
Potassium: 3.8 mmol/L (ref 3.5–5.1)
SODIUM: 139 mmol/L (ref 135–145)

## 2015-10-07 LAB — PROTIME-INR
INR: 2.04 — ABNORMAL HIGH (ref 0.00–1.49)
PROTHROMBIN TIME: 22.9 s — AB (ref 11.6–15.2)

## 2015-10-07 LAB — MAGNESIUM: MAGNESIUM: 1.6 mg/dL — AB (ref 1.7–2.4)

## 2015-10-07 MED ORDER — MAGNESIUM SULFATE 2 GM/50ML IV SOLN
2.0000 g | Freq: Once | INTRAVENOUS | Status: AC
  Administered 2015-10-07: 2 g via INTRAVENOUS
  Filled 2015-10-07: qty 50

## 2015-10-07 MED ORDER — WARFARIN SODIUM 4 MG PO TABS
4.0000 mg | ORAL_TABLET | Freq: Once | ORAL | Status: AC
  Administered 2015-10-07: 4 mg via ORAL
  Filled 2015-10-07: qty 1

## 2015-10-07 NOTE — Progress Notes (Signed)
[facility] Progress Note  Patient: Luis RamsayLorin B Derasmo ZOX:[id number]   PCP: Marguarite ArbourSPARKS,JEFFREY D, MD DOB: 07-06-1940   DOA: 10/01/2015   DOS: 10/07/2015   Date of Service: the patient was seen and examined on 10/07/2015  Subjective: The patient continues to have concerns and questions regarding his vision and denies having any acute complaint. No nausea no vomiting no shortness of breath and abdominal pain. Nutrition: Tolerating oral diet  Brief hospital course: Patient was admitted on 10/01/2015, with complaint of right eye pain with vision loss in both eyes, was found to have bilateral optic neuritis. The [facility]. Patient will be admitted high-dose IV steroids. Also completed 3 sessions of TPE. Currently further plan is  continue plasmapheresis.  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 Appreciate input. Completed five-day course of 1 g steroids without taper on 10/03/2015 Plasmapheresis 3 out of 5 on 10/06/2015. As per my discussion with further neurology improvement in vision is unlikely.  2. Essential hypertension. Continue home blood pressure medications. Blood pressure stable.  Discontinue hydrochlorothiazide due to hypokalemia  3. Dyslipidemia. Holding statin while on fluconazole.  4. A. fib. Chronic anticoagulation. [place] Vasc 5 Continue warfarin, currently rate controlled  5. Chronic kidney disease stage II. Renal function stable continue to monitor.  6. Hypokalemia. Replacing orally. Discontinue hydrochlorothiazide  Daily BMP Patient has chronic hypokalemia and has been taking 40 mg potassium on a daily basis.  7. Oral thrush. Improving. Continue Diflucan.  Pain management: When necessary Tylenol, continue home Percocet Activity: physical therapy home health Bowel regimen: last BM 10/06/2015 MiraLAX added Diet: Regular diet DVT Prophylaxis: on therapeutic  anticoagulation.  Advance goals of care discussion: Full code  Family Communication: no family was present at bedside, at the time of interview.  Disposition:  Discharge to home Expected discharge date: after completion of TPE  Consultants: Neurology Procedures: TC plasmapheresis  Antibiotics: Anti-infectives    Start     Dose/Rate Route Frequency Ordered Stop   10/04/15 1600  fluconazole (DIFLUCAN) tablet 100 mg     100 mg Oral Daily 10/04/15 1536         Intake/Output Summary (Last 24 hours) at 10/07/15 2011 Last data filed at 10/07/15 1730  Gross per 24 hour  Intake   1320 ml  Output   1700 ml  Net   -380 ml   Filed Weights   10/05/15 0500 10/06/15 0454 10/07/15 0611  Weight: 84.7 kg (186 lb 11.7 oz) 83.9 kg (184 lb 15.5 oz) 87 kg (191 lb 12.8 oz)    Objective: Physical Exam: Filed Vitals:   10/07/15 0611 10/07/15 1015 10/07/15 1423 10/07/15 1820  BP:  114/61 118/61 122/68  Pulse:  76 88 82  Temp:  97.7 F (36.5 C) 98.1 F (36.7 C) 98.6 F (37 C)  TempSrc:  Oral Oral Oral  Resp:  18 18 18   Height:      Weight: 87 kg (191 lb 12.8 oz)     SpO2:  98% 98% 98%    General: Alert, Awake and Oriented to Time, Place and Person. Appear in mild distress Eyes:APD left eye Conjunctiva normal ENT: Oral Mucosa clear moist. 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  Data Reviewed: CBC:  Recent Labs Lab 10/02/15 0608  10/03/15 0845 10/04/15 0415 10/04/15 1708 10/05/15 0416 10/06/15 0530 10/06/15 1333  WBC 6.2  --  5.5 7.3  --  6.6 6.1   --   NEUTROABS  --   --  4.7  --   --   --   --   --   HGB 12.8*  < > 13.8 14.1 13.9 14.3 13.7 13.9  HCT 37.6*  < > 40.5 41.0 41.0 40.6 40.7 41.0  MCV 87.6  --  87.1 85.2  --  85.7 85.0  --   PLT 154  --  140* 125*  --  129* 121*  --   < > = values in this interval not displayed. Basic Metabolic Panel:  Recent Labs Lab 10/03/15 0845 10/04/15 0415  10/04/15 1708 10/05/15 0416 10/06/15 0530 10/06/15 1333 10/07/15 0415  NA 142 141  --  141 140 137 139 139  K 3.1* 2.6*  < > 3.0* 2.9* 3.2* 3.5 3.8  CL 104 103  --  99* 107 104 98* 105  CO2 25 27  --   --  26 27  --  27  GLUCOSE 247* 147*  --  115* 112* 108* 105* 102*  BUN 28* 28*  --  25* 22* 17 21* 14  CREATININE 1.12 1.03  --  0.80 0.95 1.00 0.90 0.99  CALCIUM 7.9* 7.9*  --   --  7.5* 7.7*  --  8.0*  MG 2.4 2.3  --   --  1.9 1.8  --  1.6*  < > = values in this interval not displayed.  Liver Function Tests:  Recent Labs Lab 10/02/15 0608 10/03/15 0845  AST 19 20  ALT 23 18  ALKPHOS 32* 22*  BILITOT 0.9 1.0  PROT 5.6* 5.2*  ALBUMIN 3.0* 3.5   No results for input(s): LIPASE, AMYLASE in the last 168 hours. No results for input(s): AMMONIA in the last 168 hours. Coagulation Profile:  Recent Labs Lab 10/03/15 0845 10/04/15 0415 10/05/15 0416 10/06/15 0530 10/07/15 0415  INR 3.52* 3.16* 3.41* 1.91* 2.04*   Cardiac Enzymes: No results for input(s): CKTOTAL, CKMB, CKMBINDEX, TROPONINI in the last 168 hours. BNP (last 3 results) No results for input(s): PROBNP in the last 8760 hours.  CBG:  Recent Labs Lab 10/06/15 1700 10/06/15 2108 10/07/15 0611 10/07/15 1139 10/07/15 1618  GLUCAP 94 99 103* 126* 178*    Studies: No results found.   Scheduled Meds: . amLODipine  10 mg Oral BID  . digoxin  125 mcg Oral Daily  . fluconazole  100 mg Oral Daily  . heparin  1,000 Units Intracatheter Once  . heparin  1,000 Units Intracatheter Once  . heparin  3,000 Units Intravenous Once  . polyethylene glycol  17 g  Oral Daily  . potassium chloride  40 mEq Oral BID  . Warfarin - Pharmacist Dosing Inpatient   Does not apply q1800   Continuous Infusions: . citrate dextrose     PRN Meds: acetaminophen **OR** acetaminophen, acetaminophen, bisacodyl, diphenhydrAMINE, HYDROcodone-acetaminophen, ondansetron **OR** ondansetron (ZOFRAN) IV, senna-docusate, sodium chloride flush, traZODone  Time spent: 30 minutes  Author: Lynden Oxford, MD [facility] Pager: [phone] 10/07/2015 8:11 PM  If 7PM-7AM, please contact night-coverage at [email or web address], password [email or web address]

## 2015-10-07 NOTE — Progress Notes (Signed)
ANTICOAGULATION CONSULT NOTE - Follow Up Consult  Pharmacy Consult for Coumadin Indication: atrial fibrillation  No Known Allergies  Patient Measurements: Height: 5\' 9"  (175.3 cm) Weight: 191 lb 12.8 oz (87 kg) IBW/kg (Calculated) : 70.7  Vital Signs: Temp: 97.7 F (36.5 C) (05/16 1015) Temp Source: Oral (05/16 1015) BP: 114/61 mmHg (05/16 1015) Pulse Rate: 76 (05/16 1015)  Labs:  Recent Labs  10/05/15 0416 10/06/15 0530 10/06/15 1333 10/07/15 0415  HGB 14.3 13.7 13.9  --   HCT 40.6 40.7 41.0  --   PLT 129* 121*  --   --   LABPROT 33.7* 21.8*  --  22.9*  INR 3.41* 1.91*  --  2.04*  CREATININE 0.95 1.00 0.90 0.99    Estimated Creatinine Clearance: 68.2 mL/min (by C-G formula based on Cr of 0.99).  Assessment:   Continues on Coumadin for atrial fibrillation.   INR is back into low therapeutic range today (2.04), after supra- then sub-therapeutic the last few days. Coumadin hled 5/12-5/14 when INR >3.   Day # 4 Fluconazole, likely effecting INR.      Home Coumadin regimen: 6 mg daily.    Goal of Therapy:  INR 2-3 Monitor platelets by anticoagulation protocol: Yes   Plan:   Coumadin 4 mg x 1 again today.  Daily PT/INR.  Will watch for need to increase Coumadin dose when Fluconazole course is completed.  Dennie FettersEgan, Sequoyah Counterman Donovan, [place] Pager: [phone] 10/07/2015,2:00 PM

## 2015-10-07 NOTE — Care Management Important Message (Signed)
Important Message  Patient Details  Name: Faye RamsayLorin B Orebaugh MRN: [id number] Date of Birth: 09/03/1940   Medicare Important Message Given:  Yes    Anda KraftRobarge, Arasely Akkerman C, RN 10/07/2015, 11:57 AM

## 2015-10-08 LAB — BASIC METABOLIC PANEL
ANION GAP: 7 (ref 5–15)
BUN: 15 mg/dL (ref 6–20)
CALCIUM: 8.4 mg/dL — AB (ref 8.9–10.3)
CO2: 26 mmol/L (ref 22–32)
CREATININE: 1.03 mg/dL (ref 0.61–1.24)
Chloride: 104 mmol/L (ref 101–111)
GFR calc Af Amer: >60 mL/min (ref >60)
GLUCOSE: 110 mg/dL — AB (ref 65–99)
Potassium: 4.3 mmol/L (ref 3.5–5.1)
Sodium: 137 mmol/L (ref 135–145)

## 2015-10-08 LAB — CBC WITH DIFFERENTIAL/PLATELET
Basophils Absolute: 0 10*3/uL (ref 0.0–0.1)
Basophils Relative: 0 %
EOS ABS: 0.1 10*3/uL (ref 0.0–0.7)
Eosinophils Relative: 1 %
HCT: 39.1 % (ref 39.0–52.0)
HEMOGLOBIN: 13.3 g/dL (ref 13.0–17.0)
LYMPHS ABS: 1.3 10*3/uL (ref 0.7–4.0)
LYMPHS PCT: 16 %
MCH: 29.1 pg (ref 26.0–34.0)
MCHC: 34 g/dL (ref 30.0–36.0)
MCV: 85.6 fL (ref 78.0–100.0)
Monocytes Absolute: 0.6 10*3/uL (ref 0.1–1.0)
Monocytes Relative: 7 %
NEUTROS PCT: 76 %
Neutro Abs: 5.9 10*3/uL (ref 1.7–7.7)
Platelets: 144 10*3/uL — ABNORMAL LOW (ref 150–400)
RBC: 4.57 MIL/uL (ref 4.22–5.81)
RDW: 13.9 % (ref 11.5–15.5)
WBC: 7.8 10*3/uL (ref 4.0–10.5)

## 2015-10-08 LAB — GLUCOSE, CAPILLARY
Glucose-Capillary: 159 mg/dL — ABNORMAL HIGH (ref 65–99)
Glucose-Capillary: 91 mg/dL (ref 65–99)
Glucose-Capillary: 118 mg/dL — ABNORMAL HIGH (ref 65–99)

## 2015-10-08 LAB — PROTIME-INR
INR: 1.63 — AB (ref 0.00–1.49)
PROTHROMBIN TIME: 19.3 s — AB (ref 11.6–15.2)

## 2015-10-08 LAB — MAGNESIUM: Magnesium: 1.7 mg/dL (ref 1.7–2.4)

## 2015-10-08 MED ORDER — ACD FORMULA A 0.73-2.45-2.2 GM/100ML VI SOLN
Status: AC
  Administered 2015-10-08: 500 mL
  Filled 2015-10-08: qty 500

## 2015-10-08 MED ORDER — ACETAMINOPHEN 325 MG PO TABS: 650.0000 mg | ORAL_TABLET | ORAL | Status: DC | PRN

## 2015-10-08 MED ORDER — HEPARIN SODIUM (PORCINE) 1000 UNIT/ML IJ SOLN
1000.0000 [IU] | Freq: Once | INTRAMUSCULAR | Status: DC
  Filled 2015-10-08: qty 1

## 2015-10-08 MED ORDER — FLEET ENEMA 7-19 GM/118ML RE ENEM: 1.0000 | ENEMA | Freq: Every day | RECTAL | Status: DC | PRN

## 2015-10-08 MED ORDER — DIPHENHYDRAMINE HCL 25 MG PO CAPS: 25.0000 mg | ORAL_CAPSULE | Freq: Four times a day (QID) | ORAL | Status: DC | PRN

## 2015-10-08 MED ORDER — SODIUM CHLORIDE 0.9 % IV SOLN
INTRAVENOUS | Status: AC
  Administered 2015-10-08 (×3): via INTRAVENOUS_CENTRAL
  Filled 2015-10-08 (×3): qty 200

## 2015-10-08 MED ORDER — ACD FORMULA A 0.73-2.45-2.2 GM/100ML VI SOLN: 500.0000 mL | Status: DC

## 2015-10-08 MED ORDER — SODIUM CHLORIDE 0.9 % IV SOLN
4.0000 g | Freq: Once | INTRAVENOUS | Status: AC
  Administered 2015-10-08: 4 g via INTRAVENOUS
  Filled 2015-10-08: qty 40

## 2015-10-08 MED ORDER — WARFARIN SODIUM 6 MG PO TABS
6.0000 mg | ORAL_TABLET | Freq: Once | ORAL | Status: AC
  Administered 2015-10-08: 6 mg via ORAL
  Filled 2015-10-08: qty 1

## 2015-10-08 MED ORDER — CALCIUM CARBONATE ANTACID 500 MG PO CHEW
2.0000 | CHEWABLE_TABLET | ORAL | Status: AC
  Administered 2015-10-08 (×2): 400 mg via ORAL
  Filled 2015-10-08: qty 2

## 2015-10-08 MED ORDER — CALCIUM CARBONATE ANTACID 500 MG PO CHEW
CHEWABLE_TABLET | ORAL | Status: AC
  Administered 2015-10-08: 400 mg via ORAL
  Filled 2015-10-08: qty 4

## 2015-10-08 NOTE — Progress Notes (Signed)
[facility] Progress Note  Patient: Luis RamsayLorin B Byrd WUJ:[id number]   PCP: Marguarite ArbourSPARKS,JEFFREY D, MD DOB: 29-Aug-1940   DOA: 10/01/2015   DOS: 10/08/2015   Date of Service: the patient was seen and examined on 10/08/2015  Subjective:  Has not regained any of his vision-- actually can see less than at admission here  Brief hospital course: Patient was admitted on 10/01/2015, with complaint of right eye pain with vision loss in both eyes, was found to have bilateral optic neuritis. The patient ass transferred [facility]. s/p high-dose IV steroids. Also completed 3/5 sessions of TPE. (last session of 5/19)   Assessment and Plan:  Bilateral optic neuritis Treatment per neurology. Completed five-day course of 1 g steroids without taper on 10/03/2015 Plasmapheresis 4 out of 5 on 10/08/2015. improvement in vision is unlikely.   Essential hypertension. Continue home blood pressure medications. Blood pressure stable.  Discontinue hydrochlorothiazide due to hypokalemia   Dyslipidemia. Holding statin while on fluconazole.   A. fib. Chronic anticoagulation. [place] Vasc 5 Continue warfarin, currently rate controlled  Chronic kidney disease stage II. Renal function stable continue to monitor.   Hypokalemia. Replacing orally. Discontinue hydrochlorothiazide  Daily BMP Patient has chronic hypokalemia and has been taking 40 mg potassium on a daily basis.   Oral thrush. Improving. Continue Diflucan.  Pain management: When necessary Tylenol, continue home Percocet Home health with 24 hour supervision Diet: Regular diet DVT Prophylaxis: on therapeutic anticoagulation.  Advance goals of care discussion: Full code  Family Communication: no family was present at bedside, at the time of interview.  Disposition:  Discharge to home Expected discharge date: after completion of TPE 5/19  Consultants: Neurology Procedures: plasmapheresis  Antibiotics: Anti-infectives      Start     Dose/Rate Route Frequency Ordered Stop   10/04/15 1600  fluconazole (DIFLUCAN) tablet 100 mg     100 mg Oral Daily 10/04/15 1536         Intake/Output Summary (Last 24 hours) at 10/08/15 1344 Last data filed at 10/08/15 [id number]  Gross per 24 hour  Intake    600 ml  Output   1225 ml  Net   -625 ml   Filed Weights   10/05/15 0500 10/06/15 0454 10/07/15 0611  Weight: 84.7 kg (186 lb 11.7 oz) 83.9 kg (184 lb 15.5 oz) 87 kg (191 lb 12.8 oz)    Objective: Physical Exam: Filed Vitals:   10/07/15 2137 10/08/15 0122 10/08/15 0511 10/08/15 1025  BP: 155/83 118/60 131/66 125/76  Pulse: 87 90 61 81  Temp: 98.3 F (36.8 C) 98.5 F (36.9 C) 98 F (36.7 C) 98.3 F (36.8 C)  TempSrc: Oral Oral Oral Oral  Resp: 18 18 18 18   Height:      Weight:      SpO2: 99% 98% 99% 99%    General: Alert, Awake and Oriented to Time, Place and Person- says he is unable to see out of wither eye Cardiovascular: S1 and S2 Present, no Murmur, Peripheral Pulses Present Respiratory: Bilateral Air entry equal and Decreased,  Abdomen: Bowel Sound present, Soft and no tenderness  Data Reviewed: CBC:  Recent Labs Lab 10/03/15 0845 10/04/15 0415 10/04/15 1708 10/05/15 0416 10/06/15 0530 10/06/15 1333 10/08/15 0427  WBC 5.5 7.3  --  6.6 6.1  --  7.8  NEUTROABS 4.7  --   --   --   --   --  5.9  HGB 13.8 14.1 13.9 14.3 13.7 13.9 13.3  HCT 40.5  41.0 41.0 40.6 40.7 41.0 39.1  MCV 87.1 85.2  --  85.7 85.0  --  85.6  PLT 140* 125*  --  129* 121*  --  144*   Basic Metabolic Panel:  Recent Labs Lab 10/04/15 0415  10/05/15 0416 10/06/15 0530 10/06/15 1333 10/07/15 0415 10/08/15 0427  NA 141  < > 140 137 139 139 137  K 2.6*  < > 2.9* 3.2* 3.5 3.8 4.3  CL 103  < > 107 104 98* 105 104  CO2 27  --  26 27  --  27 26  GLUCOSE 147*  < > 112* 108* 105* 102* 110*  BUN 28*  < > 22* 17 21* 14 15  CREATININE 1.03  < > 0.95 1.00 0.90 0.99 1.03  CALCIUM 7.9*  --  7.5* 7.7*  --  8.0* 8.4*  MG 2.3   --  1.9 1.8  --  1.6* 1.7  < > = values in this interval not displayed.  Liver Function Tests:  Recent Labs Lab 10/02/15 0608 10/03/15 0845  AST 19 20  ALT 23 18  ALKPHOS 32* 22*  BILITOT 0.9 1.0  PROT 5.6* 5.2*  ALBUMIN 3.0* 3.5   No results for input(s): LIPASE, AMYLASE in the last 168 hours. No results for input(s): AMMONIA in the last 168 hours. Coagulation Profile:  Recent Labs Lab 10/04/15 0415 10/05/15 0416 10/06/15 0530 10/07/15 0415 10/08/15 0427  INR 3.16* 3.41* 1.91* 2.04* 1.63*   Cardiac Enzymes: No results for input(s): CKTOTAL, CKMB, CKMBINDEX, TROPONINI in the last 168 hours. BNP (last 3 results) No results for input(s): PROBNP in the last 8760 hours.  CBG:  Recent Labs Lab 10/07/15 1139 10/07/15 1618 10/07/15 2132 10/08/15 0627 10/08/15 1140  GLUCAP 126* 178* 148* 159* 91    Studies: No results found.   Scheduled Meds: . therapeutic plasma exchange solution   Dialysis Q1 Hr x 3  . amLODipine  10 mg Oral BID  . calcium carbonate      . calcium carbonate  2 tablet Oral Q3H  . calcium gluconate IVPB  4 g Intravenous Once  . citrate dextrose      . digoxin  125 mcg Oral Daily  . fluconazole  100 mg Oral Daily  . heparin  1,000 Units Intracatheter Once  . heparin  1,000 Units Intracatheter Once  . heparin  1,000 Units Intracatheter Once  . heparin  3,000 Units Intravenous Once  . polyethylene glycol  17 g Oral Daily  . potassium chloride  40 mEq Oral BID  . warfarin  6 mg Oral ONCE-1800  . Warfarin - Pharmacist Dosing Inpatient   Does not apply q1800   Continuous Infusions: . citrate dextrose     PRN Meds: acetaminophen **OR** acetaminophen, acetaminophen, bisacodyl, diphenhydrAMINE, HYDROcodone-acetaminophen, ondansetron **OR** ondansetron (ZOFRAN) IV, senna-docusate, sodium chloride flush, traZODone  Time spent: 25 minutes  Author: Marlin Canary [facility] Pager: [phone]  10/08/2015 1:44 PM  If 7PM-7AM, please  contact night-coverage at [email or web address], password [facility]

## 2015-10-08 NOTE — Progress Notes (Signed)
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 MRI concerning for inflammatory process, Abnormal signal within the left optic nerve, optic chiasm bilaterally greater on the left and along the optic tracts bilaterally greater on the left. 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 Transferred to [facility] for plasmaphoresis.    PT Comments    Patient reported blindness in both eyes without ability to see changes in light. Practiced gait with use of SPC with which pt was able to improve gait pattern. Required max verbal cues for direction. Discussed use of red-tipped blind cane. Current plan to d/c to son's home with HHPT remains appropriate.   Follow Up Recommendations  Home health PT;Supervision/Assistance - 24 hour     Equipment Recommendations  Rolling walker with 5" wheels    Recommendations for Other Services OT consult     Precautions / Restrictions Precautions Precautions: Fall Precaution Comments: Reports complete blindness in both eyes today Restrictions Weight Bearing Restrictions: No    Mobility  Bed Mobility Overal bed mobility: Independent                Transfers Overall transfer level: Needs assistance Equipment used: None Transfers: Sit to/from Stand Sit to Stand: Min guard         General transfer comment: min guard for safety with tactile cues needed for safe descent to chair   Ambulation/Gait Ambulation/Gait assistance: Min assist Ambulation Distance (Feet): 350 Feet Assistive device: 1 person hand held assist;Straight cane Gait Pattern/deviations: Step-through pattern;Decreased stride length;Shuffle Gait velocity: reduced   General Gait Details: pt very guarded and with shuffling  gait but able to correct with cues and with no demo of unsteadiness; practiced gait with use of SPC with which pt was able to improve step length; max verbal cues for directions and instruction to use AD to feel proximity to wall on L side    Stairs            Wheelchair Mobility    Modified Rankin (Stroke Patients Only)       Balance Overall balance assessment: Needs assistance Sitting-balance support: No upper extremity supported;Feet supported Sitting balance-Leahy Scale: Normal     Standing balance support: Single extremity supported Standing balance-Leahy Scale: Good                      Cognition Arousal/Alertness: Awake/alert Behavior During Therapy: WFL for tasks assessed/performed Overall Cognitive Status: Within Functional Limits for tasks assessed                      Exercises      General Comments        Pertinent Vitals/Pain Pain Assessment: No/denies pain    Home Living                      Prior Function            PT Goals (current goals can now be found in the care plan section) Acute Rehab PT Goals Patient Stated Goal: return to independence and take care of wife again Progress towards PT goals: Progressing toward goals    Frequency  Min 3X/week    PT Plan Current plan remains appropriate  Co-evaluation             End of Session Equipment Utilized During Treatment: Gait belt Activity Tolerance: Patient tolerated treatment well Patient left: in chair;with call bell/phone within reach;with chair alarm set;with family/visitor present     Time: [id number] PT Time Calculation (min) (ACUTE ONLY): 17 min  Charges:  $Gait Training: 8-22 mins                    G Codes:      [name] Krystin Keeven [place], PTA Pager: [phone]   10/08/2015, 9:55 AM

## 2015-10-08 NOTE — Progress Notes (Signed)
Interval History:                                                                                                                      Faye RamsayLorin B Wollam is an 75 y.o. male patient with  Bilateral optic neuritis. Since admitting he now has lost all sight. Cannot see light or movement.    Past Medical History: Past Medical History  Diagnosis Date  . Hypertension   . Arthritis   . Chronic a-fib (HCC)   . CHF (congestive heart failure) (HCC)   . HLD (hyperlipidemia)   . BPH (benign prostatic hyperplasia)     Past Surgical History  Procedure Laterality Date  . Hernia repair    . Knee surgery      Family History: Family History  Problem Relation Age of Onset  . Heart attack Father   . Cancer Mother     Social History:   reports that he has quit smoking. He does not have any smokeless tobacco history on file. He reports that he does not drink alcohol or use illicit drugs.  Allergies:  No Known Allergies   Medications:                                                                                                                         Current facility-administered medications:  .  acetaminophen (TYLENOL) tablet 650 mg, 650 mg, Oral, Q6H PRN, 650 mg at 10/01/15 2001 **OR** acetaminophen (TYLENOL) suppository 650 mg, 650 mg, Rectal, Q6H PRN, Marcos EkeSara E Wertman, PA-C .  acetaminophen (TYLENOL) tablet 650 mg, 650 mg, Oral, Q4H PRN, Rejeana BrockMcNeill P Kirkpatrick, MD .  amLODipine (NORVASC) tablet 10 mg, 10 mg, Oral, BID, Marcos EkeSara E Wertman, PA-C, 10 mg at 10/07/15 2140 .  bisacodyl (DULCOLAX) suppository 10 mg, 10 mg, Rectal, Daily PRN, Marcos EkeSara E Wertman, PA-C, 10 mg at 10/03/15 0904 .  citrate dextrose (ACD-A anticoagulant) solution 500 mL, 500 mL, Intravenous, Continuous, Rejeana BrockMcNeill P Kirkpatrick, MD, 500 mL at 10/06/15 1535 .  digoxin (LANOXIN) tablet 125 mcg, 125 mcg, Oral, Daily, Marcos EkeSara E Wertman, PA-C, 125 mcg at 10/07/15 0840 .  diphenhydrAMINE (BENADRYL) capsule 25 mg, 25 mg, Oral, Q6H PRN, Rejeana BrockMcNeill P  Kirkpatrick, MD .  fluconazole (DIFLUCAN) tablet 100 mg, 100 mg, Oral, Daily, Rolly SalterPranav M Patel, MD, 100 mg at 10/07/15 0840 .  heparin injection 1,000 Units, 1,000 Units, Intracatheter, Once, Rejeana BrockMcNeill P Kirkpatrick, MD .  heparin injection 1,000 Units, 1,000 Units, Intracatheter, Once, Masco CorporationMcNeill P  Amada Jupiter, MD .  heparin injection 3,000 Units, 3,000 Units, Intravenous, Once, Jeanella Craze, NP .  HYDROcodone-acetaminophen (NORCO/VICODIN) 5-325 MG per tablet 1-2 tablet, 1-2 tablet, Oral, Q4H PRN, Marcos Eke, PA-C, 2 tablet at 10/06/15 2302 .  ondansetron (ZOFRAN) tablet 4 mg, 4 mg, Oral, Q6H PRN **OR** ondansetron (ZOFRAN) injection 4 mg, 4 mg, Intravenous, Q6H PRN, Marcos Eke, PA-C, 4 mg at 10/06/15 1009 .  polyethylene glycol (MIRALAX / GLYCOLAX) packet 17 g, 17 g, Oral, Daily, Rolly Salter, MD, 17 g at 10/07/15 0840 .  potassium chloride SA (K-DUR,KLOR-CON) CR tablet 40 mEq, 40 mEq, Oral, BID, Rolly Salter, MD, 40 mEq at 10/07/15 2140 .  senna-docusate (Senokot-S) tablet 1 tablet, 1 tablet, Oral, QHS PRN, Marcos Eke, PA-C, 1 tablet at 10/03/15 0904 .  sodium chloride flush (NS) 0.9 % injection 10-40 mL, 10-40 mL, Intracatheter, PRN, Rolly Salter, MD, 10 mL at 10/07/15 1847 .  traZODone (DESYREL) tablet 25 mg, 25 mg, Oral, QHS PRN, Marcos Eke, PA-C, 25 mg at 10/07/15 2145 .  Warfarin - Pharmacist Dosing Inpatient, , Does not apply, q1800, Marquita Palms, [facility], Stopped at 10/03/15 1800   Neurologic Examination:                                                                                                     Today's Vitals   10/07/15 1820 10/07/15 2137 10/08/15 0122 10/08/15 0511  BP: 122/68 155/83 118/60 131/66  Pulse: 82 87 90 61  Temp: 98.6 F (37 C) 98.3 F (36.8 C) 98.5 F (36.9 C) 98 F (36.7 C)  TempSrc: Oral Oral Oral Oral  Resp: 18 18 18 18   Height:      Weight:      SpO2: 98% 99% 98% 99%  PainSc:  0-No pain     Evaluation of higher integrative functions  including: Level of alertness: Alert, oriented Oriented to time, place and person Speech: fluent, no evidence of dysarthria or aphasia noted.  Test the following cranial nerves: 3-12 grossly intact.unable to see light or movement  Motor examination: full 5/5 motor strength in all 4 extremities    Lab Results: Basic Metabolic Panel:  Recent Labs Lab 10/04/15 0415  10/05/15 0416 10/06/15 0530 10/06/15 1333 10/07/15 0415 10/08/15 0427  NA 141  < > 140 137 139 139 137  K 2.6*  < > 2.9* 3.2* 3.5 3.8 4.3  CL 103  < > 107 104 98* 105 104  CO2 27  --  26 27  --  27 26  GLUCOSE 147*  < > 112* 108* 105* 102* 110*  BUN 28*  < > 22* 17 21* 14 15  CREATININE 1.03  < > 0.95 1.00 0.90 0.99 1.03  CALCIUM 7.9*  --  7.5* 7.7*  --  8.0* 8.4*  MG 2.3  --  1.9 1.8  --  1.6* 1.7  < > = values in this interval not displayed.  Liver Function Tests:  Recent Labs Lab 10/02/15 0608 10/03/15 0845  AST 19 20  ALT 23  18  ALKPHOS 32* 22*  BILITOT 0.9 1.0  PROT 5.6* 5.2*  ALBUMIN 3.0* 3.5   No results for input(s): LIPASE, AMYLASE in the last 168 hours. No results for input(s): AMMONIA in the last 168 hours.  CBC:  Recent Labs Lab 10/03/15 0845 10/04/15 0415 10/04/15 1708 10/05/15 0416 10/06/15 0530 10/06/15 1333 10/08/15 0427  WBC 5.5 7.3  --  6.6 6.1  --  7.8  NEUTROABS 4.7  --   --   --   --   --  5.9  HGB 13.8 14.1 13.9 14.3 13.7 13.9 13.3  HCT 40.5 41.0 41.0 40.6 40.7 41.0 39.1  MCV 87.1 85.2  --  85.7 85.0  --  85.6  PLT 140* 125*  --  129* 121*  --  144*    Cardiac Enzymes: No results for input(s): CKTOTAL, CKMB, CKMBINDEX, TROPONINI in the last 168 hours.  Lipid Panel: No results for input(s): CHOL, TRIG, HDL, CHOLHDL, VLDL, LDLCALC in the last 168 hours.  CBG:  Recent Labs Lab 10/07/15 0611 10/07/15 1139 10/07/15 1618 10/07/15 2132 10/08/15 0627  GLUCAP 103* 126* 178* 148* 159*    Microbiology: Results for orders placed or performed in visit on 01/02/13   Urine culture     Status: None   Collection Time: 01/02/13 10:14 AM  Result Value Ref Range Status   Micro Text Report   Final       SOURCE: CLEA CATCH    ORGANISM 1                1000 CFU/ML GRAM NEGATIVE ROD   ANTIBIOTIC                                                      MRSA PCR Screening     Status: None   Collection Time: 01/02/13 10:25 AM  Result Value Ref Range Status   Micro Text Report   Final       COMMENT                   NEGATIVE - MRSA target DNA not detected   ANTIBIOTIC                                                        Imaging: No results found.  Assessment and plan:   MUATH HALLAM is an 75 y.o. male patient with Bilateral optic neuritis, currently undergoing plasmapheresis with 4th treatment today. No improvement in his vision so far. Tolerating plasmapheresis well. No other new neurological symptoms. 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 Maryclare Labrador follow up

## 2015-10-08 NOTE — Progress Notes (Signed)
ANTICOAGULATION CONSULT NOTE - Follow Up Consult  Pharmacy Consult for Coumadin Indication: atrial fibrillation  No Known Allergies  Patient Measurements: Height: 5\' 9"  (175.3 cm) Weight: 191 lb 12.8 oz (87 kg) IBW/kg (Calculated) : 70.7  Vital Signs: Temp: 98.3 F (36.8 C) (05/17 1025) Temp Source: Oral (05/17 1025) BP: 125/76 mmHg (05/17 1025) Pulse Rate: 81 (05/17 1025)  Labs:  Recent Labs  10/06/15 0530 10/06/15 1333 10/07/15 0415 10/08/15 0427  HGB 13.7 13.9  --  13.3  HCT 40.7 41.0  --  39.1  PLT 121*  --   --  144*  LABPROT 21.8*  --  22.9* 19.3*  INR 1.91*  --  2.04* 1.63*  CREATININE 1.00 0.90 0.99 1.03    Estimated Creatinine Clearance: 65.6 mL/min (by C-G formula based on Cr of 1.03).  Assessment:   Continues on Coumadin for atrial fibrillation.   INR is subtherapeutic today (1.63). Previously supra- then sub-therapeutic, then therapeutic x 1. Coumadin held 5/12-5/14 when INR >3. Then received Coumadin 4 mg daily x 2 days.   Day # 5 Fluconazole, likely effecting INR, but interaction may have maximized.      Home Coumadin regimen: 6 mg daily.    Goal of Therapy:  INR 2-3 Monitor platelets by anticoagulation protocol: Yes   Plan:   Increase Coumadin to 6 mg x 1 today.  Daily PT/INR.  Watching for change in Coumadin requirements while on Fluconazole.  Dennie FettersEgan, Denica Web Donovan, RPh Pager: [phone] 10/08/2015,12:11 PM

## 2015-10-09 LAB — GLUCOSE, CAPILLARY
GLUCOSE-CAPILLARY: 96 mg/dL (ref 65–99)
GLUCOSE-CAPILLARY: 120 mg/dL — AB (ref 65–99)
Glucose-Capillary: 112 mg/dL — ABNORMAL HIGH (ref 65–99)

## 2015-10-09 LAB — PROTIME-INR
INR: 2.02 — AB (ref 0.00–1.49)
Prothrombin Time: 22.7 seconds — ABNORMAL HIGH (ref 11.6–15.2)

## 2015-10-09 LAB — POCT I-STAT, CHEM 8
BUN: 16 mg/dL (ref 6–20)
CALCIUM ION: 1.18 mmol/L (ref 1.13–1.30)
CHLORIDE: 97 mmol/L — AB (ref 101–111)
Creatinine, Ser: 1 mg/dL (ref 0.61–1.24)
GLUCOSE: 102 mg/dL — AB (ref 65–99)
HCT: 40 % (ref 39.0–52.0)
Hemoglobin: 13.6 g/dL (ref 13.0–17.0)
Potassium: 4.2 mmol/L (ref 3.5–5.1)
Sodium: 137 mmol/L (ref 135–145)
TCO2: 25 mmol/L (ref 0–100)

## 2015-10-09 MED ORDER — WARFARIN SODIUM 6 MG PO TABS
6.0000 mg | ORAL_TABLET | Freq: Once | ORAL | Status: AC
  Administered 2015-10-09: 6 mg via ORAL
  Filled 2015-10-09: qty 1

## 2015-10-09 NOTE — Progress Notes (Addendum)
[facility] Progress Note  Patient: Luis Byrd WNU:[id number]   PCP: Marguarite ArbourSPARKS,JEFFREY D, MD DOB: November 26, 1940   DOA: 10/01/2015   DOS: 10/09/2015   Date of Service: the patient was seen and examined on 10/09/2015  Subjective:  Has not regained any of his vision-- actually can see less than at admission here  Brief hospital course: Patient was admitted on 10/01/2015, with complaint of right eye pain with vision loss in both eyes, was found to have bilateral optic neuritis. The [facility]. s/p high-dose IV steroids. Also completed 3/5 sessions of TPE. (last session of 5/19)   Assessment and Plan:  Bilateral optic neuritis Treatment per neurology. Completed five-day course of 1 g steroids without taper on 10/03/2015 Plasmapheresis 4 out of 5 on 10/08/2015. improvement in vision is unlikely. ?immunosuppression-- defer to neuro   Essential hypertension. Continue home blood pressure medications. Blood pressure stable.  Discontinue hydrochlorothiazide due to hypokalemia   Dyslipidemia. Holding statin while on fluconazole.   A. fib. Chronic anticoagulation. [place] 5 Continue warfarin, currently rate controlled  Chronic kidney disease stage II. Renal function stable continue to monitor.   Hypokalemia. Replacing orally. Discontinue hydrochlorothiazide  Daily BMP Patient has chronic hypokalemia and has been taking 40 mg potassium on a daily basis.   Oral thrush. Improving. Continue Diflucan.  Pain management: When necessary Tylenol, continue home Percocet Home health with 24 hour supervision Diet: Regular diet DVT Prophylaxis: on therapeutic anticoagulation.  Advance goals of care discussion: Full code  Family Communication:  Granddaughter at bedside  Disposition:  Discharge to home Expected discharge date: after completion of TPE 5/19 if early or AM of 5/20  Consultants: Neurology Procedures:  plasmapheresis  Antibiotics: Anti-infectives    Start     Dose/Rate Route Frequency Ordered Stop   10/04/15 1600  fluconazole (DIFLUCAN) tablet 100 mg     100 mg Oral Daily 10/04/15 1536         Intake/Output Summary (Last 24 hours) at 10/09/15 1226 Last data filed at 10/09/15 0830  Gross per 24 hour  Intake    370 ml  Output   1050 ml  Net   -680 ml   Filed Weights   10/06/15 0454 10/07/15 0611 10/09/15 0546  Weight: 83.9 kg (184 lb 15.5 oz) 87 kg (191 lb 12.8 oz) 84 kg (185 lb 3 oz)    Objective: Physical Exam: Filed Vitals:   10/09/15 0221 10/09/15 0546 10/09/15 0932 10/09/15 0941  BP: 108/61 148/68  163/72  Pulse: 70 68 81 88  Temp: 98.3 F (36.8 C) 97.8 F (36.6 C)  97.8 F (36.6 C)  TempSrc: Oral Oral  Oral  Resp: 20 18  20   Height:      Weight:  84 kg (185 lb 3 oz)    SpO2: 98% 100%  100%    General: Alert, Awake and Oriented to Time, Place and Person- says he is unable to see out of either eye Cardiovascular: S1 and S2 Present, no Murmur, Peripheral Pulses Present Respiratory: Bilateral Air entry equal and Decreased,  Abdomen: Bowel Sound present, Soft and no tenderness  Data Reviewed: CBC:  Recent Labs Lab 10/03/15 0845 10/04/15 0415  10/05/15 0416 10/06/15 0530 10/06/15 1333 10/08/15 0427 10/08/15 1437  WBC 5.5 7.3  --  6.6 6.1  --  7.8  --   NEUTROABS 4.7  --   --   --   --   --  5.9  --  HGB 13.8 14.1  < > 14.3 13.7 13.9 13.3 13.6  HCT 40.5 41.0  < > 40.6 40.7 41.0 39.1 40.0  MCV 87.1 85.2  --  85.7 85.0  --  85.6  --   PLT 140* 125*  --  129* 121*  --  144*  --   < > = values in this interval not displayed. Basic Metabolic Panel:  Recent Labs Lab 10/04/15 0415  10/05/15 0416 10/06/15 0530 10/06/15 1333 10/07/15 0415 10/08/15 0427 10/08/15 1437  NA 141  < > 140 137 139 139 137 137  K 2.6*  < > 2.9* 3.2* 3.5 3.8 4.3 4.2  CL 103  < > 107 104 98* 105 104 97*  CO2 27  --  26 27  --  27 26  --   GLUCOSE 147*  < > 112* 108* 105*  102* 110* 102*  BUN 28*  < > 22* 17 21* CREATININE 1.03  < > 0.95 1.00 0.90 0.99 1.03 1.00  CALCIUM 7.9*  --  7.5* 7.7*  --  8.0* 8.4*  --   MG 2.3  --  1.9 1.8  --  1.6* 1.7  --   < > = values in this interval not displayed.  Liver Function Tests:  Recent Labs Lab 10/03/15 0845  AST 20  ALT 18  ALKPHOS 22*  BILITOT 1.0  PROT 5.2*  ALBUMIN 3.5   No results for input(s): LIPASE, AMYLASE in the last 168 hours. No results for input(s): AMMONIA in the last 168 hours. Coagulation Profile:  Recent Labs Lab 10/05/15 0416 10/06/15 0530 10/07/15 0415 10/08/15 0427 10/09/15 0605  INR 3.41* 1.91* 2.04* 1.63* 2.02*   Cardiac Enzymes: No results for input(s): CKTOTAL, CKMB, CKMBINDEX, TROPONINI in the last 168 hours. BNP (last 3 results) No results for input(s): PROBNP in the last 8760 hours.  CBG:  Recent Labs Lab 10/07/15 2132 10/08/15 0627 10/08/15 1140 10/08/15 2006 10/09/15 1120  GLUCAP 148* 159* 91 118* 112*    Studies: No results found.   Scheduled Meds: . amLODipine  10 mg Oral BID  . digoxin  125 mcg Oral Daily  . fluconazole  100 mg Oral Daily  . heparin  1,000 Units Intracatheter Once  . heparin  1,000 Units Intracatheter Once  . heparin  1,000 Units Intracatheter Once  . heparin  3,000 Units Intravenous Once  . polyethylene glycol  17 g Oral Daily  . potassium chloride  40 mEq Oral BID  . warfarin  6 mg Oral ONCE-1800  . Warfarin - Pharmacist Dosing Inpatient   Does not apply q1800   Continuous Infusions: . citrate dextrose     PRN Meds: acetaminophen **OR** acetaminophen, acetaminophen, bisacodyl, diphenhydrAMINE, HYDROcodone-acetaminophen, ondansetron **OR** ondansetron (ZOFRAN) IV, senna-docusate, sodium chloride flush, sodium phosphate, traZODone  Time spent: 25 minutes  Author: Marlin Canary [facility] Pager: [phone]  10/09/2015 12:26 PM  If 7PM-7AM, please contact night-coverage at [email or web address], password [email or web address]

## 2015-10-09 NOTE — Progress Notes (Signed)
ANTICOAGULATION CONSULT NOTE - Follow Up Consult  Pharmacy Consult for Coumadin Indication: atrial fibrillation  No Known Allergies  Patient Measurements: Height: 5\' 9"  (175.3 cm) Weight: 185 lb 3 oz (84 kg) IBW/kg (Calculated) : 70.7  Vital Signs: Temp: 97.8 F (36.6 C) (05/18 0941) Temp Source: Oral (05/18 0941) BP: 163/72 mmHg (05/18 0941) Pulse Rate: 88 (05/18 0941)  Labs:  Recent Labs  10/06/15 1333 10/07/15 0415 10/08/15 0427 10/08/15 1437 10/09/15 0605  HGB 13.9  --  13.3 13.6  --   HCT 41.0  --  39.1 40.0  --   PLT  --   --  144*  --   --   LABPROT  --  22.9* 19.3*  --  22.7*  INR  --  2.04* 1.63*  --  2.02*  CREATININE 0.90 0.99 1.03 1.00  --     Estimated Creatinine Clearance: 61.9 mL/min (by C-G formula based on Cr of 1).  Assessment:   Continues on Coumadin for atrial fibrillation.   INR is back into therapeutic range today (2.02). Previously supra- then sub-therapeutic, then therapeutic x 1. Coumadin held 5/12-5/14 when INR >3. Then received Coumadin 4 mg daily x 2 days, then 6 mg yesterday.   Day # 6 Fluconazole, likely effecting INR, but interaction may have maximized.      Home Coumadin regimen: 6 mg daily.    Goal of Therapy:  INR 2-3 Monitor platelets by anticoagulation protocol: Yes   Plan:   Coumadin 6 mg again today. Usual dose.  Daily PT/INR.  Watching for change in Coumadin requirements while on Fluconazole.  Dennie FettersEgan, Jager Koska Donovan, RPh Pager: [phone] 10/09/2015,11:26 AM

## 2015-10-09 NOTE — Progress Notes (Signed)
md order for home phy therapy. Pt to go to son's home at [address] [place], ph [phone]. 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 [facility]. Pt states has several walkers and cane.

## 2015-10-09 NOTE — Discharge Instructions (Signed)

## 2015-10-09 NOTE — Progress Notes (Signed)
Interval History:                                                                                                                      Faye RamsayLorin B Tetreault is an 75 y.o. male patient with  Bilateral optic neuritis. Since admitting he now has lost all sight. Cannot see light or movement.    Past Medical History: Past Medical History  Diagnosis Date  . Hypertension   . Arthritis   . Chronic a-fib (HCC)   . CHF (congestive heart failure) (HCC)   . HLD (hyperlipidemia)   . BPH (benign prostatic hyperplasia)     Past Surgical History  Procedure Laterality Date  . Hernia repair    . Knee surgery      Family History: Family History  Problem Relation Age of Onset  . Heart attack Father   . Cancer Mother     Social History:   reports that he has quit smoking. He does not have any smokeless tobacco history on file. He reports that he does not drink alcohol or use illicit drugs.  Allergies:  No Known Allergies   Medications:                                                                                                                         Current facility-administered medications:  .  acetaminophen (TYLENOL) tablet 650 mg, 650 mg, Oral, Q6H PRN, 650 mg at 10/01/15 2001 **OR** acetaminophen (TYLENOL) suppository 650 mg, 650 mg, Rectal, Q6H PRN, Marcos EkeSara E Wertman, PA-C .  acetaminophen (TYLENOL) tablet 650 mg, 650 mg, Oral, Q4H PRN, Rejeana BrockMcNeill P Kirkpatrick, MD .  amLODipine (NORVASC) tablet 10 mg, 10 mg, Oral, BID, Marcos EkeSara E Wertman, PA-C, 10 mg at 10/08/15 2030 .  bisacodyl (DULCOLAX) suppository 10 mg, 10 mg, Rectal, Daily PRN, Marcos EkeSara E Wertman, PA-C, 10 mg at 10/03/15 0904 .  citrate dextrose (ACD-A anticoagulant) solution 500 mL, 500 mL, Intravenous, Continuous, Rejeana BrockMcNeill P Kirkpatrick, MD, 500 mL at 10/06/15 1535 .  digoxin (LANOXIN) tablet 125 mcg, 125 mcg, Oral, Daily, Marcos EkeSara E Wertman, PA-C, 125 mcg at 10/08/15 1019 .  diphenhydrAMINE (BENADRYL) capsule 25 mg, 25 mg, Oral, Q6H PRN, Rejeana BrockMcNeill P  Kirkpatrick, MD .  fluconazole (DIFLUCAN) tablet 100 mg, 100 mg, Oral, Daily, Rolly SalterPranav M Patel, MD, 100 mg at 10/08/15 1018 .  heparin injection 1,000 Units, 1,000 Units, Intracatheter, Once, Rejeana BrockMcNeill P Kirkpatrick, MD .  heparin injection 1,000 Units, 1,000 Units, Intracatheter, Once, Masco CorporationMcNeill P  Amada Jupiter, MD .  heparin injection 1,000 Units, 1,000 Units, Intracatheter, Once, Rolly Salter, MD .  heparin injection 3,000 Units, 3,000 Units, Intravenous, Once, Jeanella Craze, NP .  HYDROcodone-acetaminophen (NORCO/VICODIN) 5-325 MG per tablet 1-2 tablet, 1-2 tablet, Oral, Q4H PRN, Marcos Eke, PA-C, 2 tablet at 10/09/15 1610 .  ondansetron (ZOFRAN) tablet 4 mg, 4 mg, Oral, Q6H PRN **OR** ondansetron (ZOFRAN) injection 4 mg, 4 mg, Intravenous, Q6H PRN, Marcos Eke, PA-C, 4 mg at 10/06/15 1009 .  polyethylene glycol (MIRALAX / GLYCOLAX) packet 17 g, 17 g, Oral, Daily, Rolly Salter, MD, 17 g at 10/08/15 1018 .  potassium chloride SA (K-DUR,KLOR-CON) CR tablet 40 mEq, 40 mEq, Oral, BID, Rolly Salter, MD, 40 mEq at 10/08/15 2030 .  senna-docusate (Senokot-S) tablet 1 tablet, 1 tablet, Oral, QHS PRN, Marcos Eke, PA-C, 1 tablet at 10/08/15 2030 .  sodium chloride flush (NS) 0.9 % injection 10-40 mL, 10-40 mL, Intracatheter, PRN, Rolly Salter, MD, 10 mL at 10/08/15 1836 .  sodium phosphate (FLEET) 7-19 GM/118ML enema 1 enema, 1 enema, Rectal, Daily PRN, Joseph Art, DO .  traZODone (DESYREL) tablet 25 mg, 25 mg, Oral, QHS PRN, Marcos Eke, PA-C, 25 mg at 10/09/15 0003 .  Warfarin - Pharmacist Dosing Inpatient, , Does not apply, q1800, Marquita Palms, [facility], Stopped at 10/03/15 1800   Neurologic Examination:                                                                                                     Today's Vitals   10/09/15 0005 10/09/15 0103 10/09/15 0221 10/09/15 0546  BP:   108/61 148/68  Pulse:   70 68  Temp:   98.3 F (36.8 C) 97.8 F (36.6 C)  TempSrc:   Oral Oral   Resp:   20 18  Height:      Weight:    84 kg (185 lb 3 oz)  SpO2:   98% 100%  PainSc: 5  0-No pain     Evaluation of higher integrative functions including: Level of alertness: Alert, oriented Oriented to time, place and person Speech: fluent, no evidence of dysarthria or aphasia noted.  Test the following cranial nerves: 3-12 grossly intact.unable to see light or movement  Motor examination: full 5/5 motor strength in all 4 extremities    Lab Results: Basic Metabolic Panel:  Recent Labs Lab 10/04/15 0415  10/05/15 0416 10/06/15 0530 10/06/15 1333 10/07/15 0415 10/08/15 0427  NA 141  < > 140 137 139 139 137  K 2.6*  < > 2.9* 3.2* 3.5 3.8 4.3  CL 103  < > 107 104 98* 105 104  CO2 27  --  26 27  --  27 26  GLUCOSE 147*  < > 112* 108* 105* 102* 110*  BUN 28*  < > 22* 17 21* 14 15  CREATININE 1.03  < > 0.95 1.00 0.90 0.99 1.03  CALCIUM 7.9*  --  7.5* 7.7*  --  8.0* 8.4*  MG 2.3  --  1.9 1.8  --  1.6* 1.7  < > = values in this interval not displayed.  Liver Function Tests:  Recent Labs Lab 10/03/15 0845  AST 20  ALT 18  ALKPHOS 22*  BILITOT 1.0  PROT 5.2*  ALBUMIN 3.5   No results for input(s): LIPASE, AMYLASE in the last 168 hours. No results for input(s): AMMONIA in the last 168 hours.  CBC:  Recent Labs Lab 10/03/15 0845 10/04/15 0415 10/04/15 1708 10/05/15 0416 10/06/15 0530 10/06/15 1333 10/08/15 0427  WBC 5.5 7.3  --  6.6 6.1  --  7.8  NEUTROABS 4.7  --   --   --   --   --  5.9  HGB 13.8 14.1 13.9 14.3 13.7 13.9 13.3  HCT 40.5 41.0 41.0 40.6 40.7 41.0 39.1  MCV 87.1 85.2  --  85.7 85.0  --  85.6  PLT 140* 125*  --  129* 121*  --  144*    Cardiac Enzymes: No results for input(s): CKTOTAL, CKMB, CKMBINDEX, TROPONINI in the last 168 hours.  Lipid Panel: No results for input(s): CHOL, TRIG, HDL, CHOLHDL, VLDL, LDLCALC in the last 168 hours.  CBG:  Recent Labs Lab 10/07/15 1618 10/07/15 2132 10/08/15 0627 10/08/15 1140 10/08/15 2006   GLUCAP 178* 148* 159* 91 118*    Microbiology: Results for orders placed or performed in visit on 01/02/13  Urine culture     Status: None   Collection Time: 01/02/13 10:14 AM  Result Value Ref Range Status   Micro Text Report   Final       SOURCE: CLEA CATCH    ORGANISM 1                1000 CFU/ML GRAM NEGATIVE ROD   ANTIBIOTIC                                                      MRSA PCR Screening     Status: None   Collection Time: 01/02/13 10:25 AM  Result Value Ref Range Status   Micro Text Report   Final       COMMENT                   NEGATIVE - MRSA target DNA not detected   ANTIBIOTIC                                                        Imaging: No results found.  Assessment and plan:   LINTON STOLP is an 75 y.o. male patient with Bilateral optic neuritis, currently undergoing plasmapheresis with 4th treatment yesterday. No improvement in his vision so far. Tolerating plasmapheresis well. No other new neurological symptoms. Last PLX tomorrow. At that point he may be discharged. HE will need follow up with out patient neurology.   Felicie Morn PA-C [facility] [phone]  10/09/2015, 9:23 AM

## 2015-10-09 NOTE — Care Management Important Message (Signed)
Important Message  Patient Details  Name: Luis Byrd MRN: [id number] Date of Birth: November 02, 1940   Medicare Important Message Given:  Yes    Hanley HaysDowell, Lenisha Lacap T, RN 10/09/2015, 9:21 AM

## 2015-10-10 ENCOUNTER — Inpatient Hospital Stay (HOSPITAL_COMMUNITY): Payer: Medicare Other

## 2015-10-10 DIAGNOSIS — H5710 Ocular pain, unspecified eye: Secondary | ICD-10-CM

## 2015-10-10 LAB — CBC
HEMATOCRIT: 40 % (ref 39.0–52.0)
Hemoglobin: 13.4 g/dL (ref 13.0–17.0)
MCH: 29.2 pg (ref 26.0–34.0)
MCHC: 33.5 g/dL (ref 30.0–36.0)
MCV: 87.1 fL (ref 78.0–100.0)
Platelets: 174 10*3/uL (ref 150–400)
RBC: 4.59 MIL/uL (ref 4.22–5.81)
RDW: 14.2 % (ref 11.5–15.5)
WBC: 6.7 10*3/uL (ref 4.0–10.5)

## 2015-10-10 LAB — GLUCOSE, CAPILLARY
GLUCOSE-CAPILLARY: 95 mg/dL (ref 65–99)
GLUCOSE-CAPILLARY: 195 mg/dL — AB (ref 65–99)
Glucose-Capillary: 235 mg/dL — ABNORMAL HIGH (ref 65–99)

## 2015-10-10 LAB — COMPREHENSIVE METABOLIC PANEL
ALBUMIN: 4 g/dL (ref 3.5–5.0)
ALK PHOS: 27 U/L — AB (ref 38–126)
ALT: 17 U/L (ref 17–63)
AST: 19 U/L (ref 15–41)
Anion gap: 8 (ref 5–15)
BILIRUBIN TOTAL: 0.9 mg/dL (ref 0.3–1.2)
BUN: 11 mg/dL (ref 6–20)
CALCIUM: 9 mg/dL (ref 8.9–10.3)
CO2: 28 mmol/L (ref 22–32)
Chloride: 100 mmol/L — ABNORMAL LOW (ref 101–111)
Creatinine, Ser: 1.13 mg/dL (ref 0.61–1.24)
GFR calc Af Amer: >60 mL/min (ref >60)
GFR calc non Af Amer: >60 mL/min (ref >60)
GLUCOSE: 165 mg/dL — AB (ref 65–99)
Potassium: 4.2 mmol/L (ref 3.5–5.1)
Sodium: 136 mmol/L (ref 135–145)
TOTAL PROTEIN: 5.1 g/dL — AB (ref 6.5–8.1)

## 2015-10-10 LAB — PROTIME-INR
INR: 2.03 — AB (ref 0.00–1.49)
Prothrombin Time: 22.9 seconds — ABNORMAL HIGH (ref 11.6–15.2)

## 2015-10-10 MED ORDER — SODIUM CHLORIDE 0.9 % IV SOLN
250.0000 mg | Freq: Four times a day (QID) | INTRAVENOUS | Status: DC
  Administered 2015-10-10 – 2015-10-13 (×11): 250 mg via INTRAVENOUS
  Filled 2015-10-10 (×13): qty 2

## 2015-10-10 MED ORDER — CALCIUM CARBONATE ANTACID 500 MG PO CHEW
2.0000 | CHEWABLE_TABLET | ORAL | Status: AC
Start: 2015-10-10 — End: 2015-10-10
  Administered 2015-10-10: 400 mg via ORAL

## 2015-10-10 MED ORDER — WARFARIN SODIUM 6 MG PO TABS
6.0000 mg | ORAL_TABLET | Freq: Every day | ORAL | Status: DC
  Administered 2015-10-10 – 2015-10-11 (×2): 6 mg via ORAL
  Filled 2015-10-10 (×2): qty 1

## 2015-10-10 MED ORDER — SODIUM CHLORIDE 0.9 % IV SOLN
500.0000 mg | Freq: Once | INTRAVENOUS | Status: AC
  Administered 2015-10-10: 500 mg via INTRAVENOUS
  Filled 2015-10-10: qty 4

## 2015-10-10 MED ORDER — ACD FORMULA A 0.73-2.45-2.2 GM/100ML VI SOLN
Status: AC
  Filled 2015-10-10: qty 500

## 2015-10-10 MED ORDER — ACD FORMULA A 0.73-2.45-2.2 GM/100ML VI SOLN
Status: AC
  Administered 2015-10-10: 500 mL via INTRAVENOUS
  Filled 2015-10-10: qty 500

## 2015-10-10 MED ORDER — ACETAMINOPHEN 325 MG PO TABS: 650.0000 mg | ORAL_TABLET | ORAL | Status: DC | PRN

## 2015-10-10 MED ORDER — CALCIUM CARBONATE ANTACID 500 MG PO CHEW
CHEWABLE_TABLET | ORAL | Status: AC
  Administered 2015-10-10: 400 mg via ORAL
  Filled 2015-10-10: qty 4

## 2015-10-10 MED ORDER — SODIUM CHLORIDE 0.9 % IV SOLN
4.0000 g | Freq: Once | INTRAVENOUS | Status: DC
  Filled 2015-10-10 (×2): qty 40

## 2015-10-10 MED ORDER — HEPARIN SODIUM (PORCINE) 1000 UNIT/ML IJ SOLN
1000.0000 [IU] | Freq: Once | INTRAMUSCULAR | Status: DC
  Filled 2015-10-10: qty 1

## 2015-10-10 MED ORDER — ALBUMIN HUMAN 25 % IV SOLN
INTRAVENOUS | Status: AC
  Administered 2015-10-10 (×3): via INTRAVENOUS_CENTRAL
  Filled 2015-10-10 (×3): qty 200

## 2015-10-10 MED ORDER — PANTOPRAZOLE SODIUM 40 MG IV SOLR
40.0000 mg | INTRAVENOUS | Status: DC
  Administered 2015-10-10 – 2015-10-12 (×3): 40 mg via INTRAVENOUS
  Filled 2015-10-10 (×3): qty 40

## 2015-10-10 MED ORDER — GADOBENATE DIMEGLUMINE 529 MG/ML IV SOLN
18.0000 mL | Freq: Once | INTRAVENOUS | Status: AC | PRN
  Administered 2015-10-10: 18 mL via INTRAVENOUS

## 2015-10-10 MED ORDER — ACD FORMULA A 0.73-2.45-2.2 GM/100ML VI SOLN
500.0000 mL | Status: DC
  Administered 2015-10-10: 500 mL via INTRAVENOUS

## 2015-10-10 MED ORDER — DIPHENHYDRAMINE HCL 25 MG PO CAPS: 25.0000 mg | ORAL_CAPSULE | Freq: Four times a day (QID) | ORAL | Status: DC | PRN

## 2015-10-10 NOTE — Progress Notes (Signed)
ANTICOAGULATION CONSULT NOTE - Follow Up Consult  Pharmacy Consult for Coumadin Indication: atrial fibrillation  No Known Allergies  Patient Measurements: Height: 5\' 9"  (175.3 cm) Weight: 185 lb 3 oz (84 kg) IBW/kg (Calculated) : 70.7  Vital Signs: Temp: 98.2 F (36.8 C) (05/19 0922) Temp Source: Oral (05/19 0922) BP: 119/73 mmHg (05/19 0922) Pulse Rate: 69 (05/19 0925)  Labs:  Recent Labs  10/08/15 0427 10/08/15 1437 10/09/15 0605 10/10/15 0430  HGB 13.3 13.6  --   --   HCT 39.1 40.0  --   --   PLT 144*  --   --   --   LABPROT 19.3*  --  22.7* 22.9*  INR 1.63*  --  2.02* 2.03*  CREATININE 1.03 1.00  --   --     Estimated Creatinine Clearance: 61.9 mL/min (by C-G formula based on Cr of 1).  Assessment:   Continues on Coumadin for atrial fibrillation.   INR remains low therapeutic (2.03). Previously supra- then sub-therapeutic, then therapeutic. Coumadin held 5/12-5/14 when INR >3. Then received Coumadin 4 mg daily x 2 days, then 6 x 2 days.    Day # 7 Fluconazole, likely effecting INR earlier, but interaction may have maximized.      Home Coumadin regimen: 6 mg daily. Patient reports on this regimen for several years with consistent INRs.    Goal of Therapy:  INR 2-3 Monitor platelets by anticoagulation protocol: Yes   Plan:   Coumadin 6 mg daily.  Daily PT/INR.  Watching for change in Coumadin requirements while on Fluconazole.  Dennie FettersEgan, Evoleth Nordmeyer Donovan, [place] Pager: [phone] 10/10/2015,12:19 PM

## 2015-10-10 NOTE — Progress Notes (Signed)
Interval History:                                                                                                                      Luis Byrd is an 75 y.o. male patient with  Bilateral optic neuritis. Since admitting he now has lost all sight. Cannot see light or movement.    Past Medical History: Past Medical History  Diagnosis Date  . Hypertension   . Arthritis   . Chronic a-fib (HCC)   . CHF (congestive heart failure) (HCC)   . HLD (hyperlipidemia)   . BPH (benign prostatic hyperplasia)     Past Surgical History  Procedure Laterality Date  . Hernia repair    . Knee surgery      Family History: Family History  Problem Relation Age of Onset  . Heart attack Father   . Cancer Mother     Social History:   reports that he has quit smoking. He does not have any smokeless tobacco history on file. He reports that he does not drink alcohol or use illicit drugs.  Allergies:  No Known Allergies   Medications:                                                                                                                         Current facility-administered medications:  .  acetaminophen (TYLENOL) tablet 650 mg, 650 mg, Oral, Q6H PRN, 650 mg at 10/01/15 2001 **OR** acetaminophen (TYLENOL) suppository 650 mg, 650 mg, Rectal, Q6H PRN, Marcos Eke, PA-C .  acetaminophen (TYLENOL) tablet 650 mg, 650 mg, Oral, Q4H PRN, Rejeana Brock, MD .  amLODipine (NORVASC) tablet 10 mg, 10 mg, Oral, BID, Marcos Eke, PA-C, 10 mg at 10/10/15 1610 .  bisacodyl (DULCOLAX) suppository 10 mg, 10 mg, Rectal, Daily PRN, Marcos Eke, PA-C, 10 mg at 10/09/15 0932 .  citrate dextrose (ACD-A anticoagulant) solution 500 mL, 500 mL, Intravenous, Continuous, Rejeana Brock, MD, 500 mL at 10/06/15 1535 .  digoxin (LANOXIN) tablet 125 mcg, 125 mcg, Oral, Daily, Marcos Eke, PA-C, 125 mcg at 10/10/15 9604 .  diphenhydrAMINE (BENADRYL) capsule 25 mg, 25 mg, Oral, Q6H PRN, Rejeana Brock, MD .  fluconazole (DIFLUCAN) tablet 100 mg, 100 mg, Oral, Daily, Rolly Salter, MD, 100 mg at 10/10/15 0925 .  heparin injection 1,000 Units, 1,000 Units, Intracatheter, Once, Rejeana Brock, MD .  heparin injection 1,000 Units, 1,000 Units, Intracatheter, Once, [employer]  Amada JupiterKirkpatrick, MD .  heparin injection 1,000 Units, 1,000 Units, Intracatheter, Once, Rolly SalterPranav M Patel, MD .  heparin injection 3,000 Units, 3,000 Units, Intravenous, Once, Jeanella CrazeBrandi L Ollis, NP .  HYDROcodone-acetaminophen (NORCO/VICODIN) 5-325 MG per tablet 1-2 tablet, 1-2 tablet, Oral, Q4H PRN, Marcos EkeSara E Wertman, PA-C, 2 tablet at 10/10/15 0925 .  methylPREDNISolone sodium succinate (SOLU-MEDROL) 500 mg in sodium chloride 0.9 % 50 mL IVPB, 500 mg, Intravenous, Once, Ulice Dashavid R Kynedi Profitt, PA-C .  ondansetron [facility](ZOFRAN) tablet 4 mg, 4 mg, Oral, Q6H PRN **OR** ondansetron (ZOFRAN) injection 4 mg, 4 mg, Intravenous, Q6H PRN, Marcos EkeSara E Wertman, PA-C, 4 mg at 10/06/15 1009 .  polyethylene glycol (MIRALAX / GLYCOLAX) packet 17 g, 17 g, Oral, Daily, Rolly SalterPranav M Patel, MD, 17 g at 10/10/15 0925 .  potassium chloride SA (K-DUR,KLOR-CON) CR tablet 40 mEq, 40 mEq, Oral, BID, Rolly SalterPranav M Patel, MD, 40 mEq at 10/10/15 0925 .  senna-docusate (Senokot-S) tablet 1 tablet, 1 tablet, Oral, QHS PRN, Marcos EkeSara E Wertman, PA-C, 1 tablet at 10/08/15 2030 .  sodium chloride flush (NS) 0.9 % injection 10-40 mL, 10-40 mL, Intracatheter, PRN, Rolly SalterPranav M Patel, MD, 10 mL at 10/08/15 1836 .  sodium phosphate (FLEET) 7-19 GM/118ML enema 1 enema, 1 enema, Rectal, Daily PRN, Joseph ArtJessica U Vann, DO .  traZODone (DESYREL) tablet 25 mg, 25 mg, Oral, QHS PRN, Marcos EkeSara E Wertman, PA-C, 25 mg at 10/09/15 2121 .  Warfarin - Pharmacist Dosing Inpatient, , Does not apply, q1800, Marquita PalmsCorey M Ball, [facility] CenterRPH, Stopped at 10/03/15 1800   Neurologic Examination:                                                                                                     Today's Vitals   10/10/15 0410 10/10/15  0532 10/10/15 0922 10/10/15 0925  BP:  117/69 119/73   Pulse:  57 52 69  Temp:  97.9 F (36.6 C) 98.2 F (36.8 C)   TempSrc:  Oral Oral   Resp:  18 20   Height:      Weight:      SpO2:  98% 98%   PainSc: 0-No pain   8    Evaluation of higher integrative functions including: Level of alertness: Alert, oriented Oriented to time, place and person Speech: fluent, no evidence of dysarthria or aphasia noted.  Test the following cranial nerves: 3-12 grossly intact.unable to see light or movement  Motor examination: full 5/5 motor strength in all 4 extremities    Lab Results: Basic Metabolic Panel:  Recent Labs Lab 10/04/15 0415  10/05/15 0416 10/06/15 0530 10/06/15 1333 10/07/15 0415 10/08/15 0427 10/08/15 1437  NA 141  < > 140 137 139 139 137 137  K 2.6*  < > 2.9* 3.2* 3.5 3.8 4.3 4.2  CL 103  < > 107 104 98* 105 104 97*  CO2 27  --  26 27  --  27 26  --   GLUCOSE 147*  < > 112* 108* 105* 102* 110* 102*  BUN 28*  < > 22* 17 21* 14 15 16   CREATININE 1.03  < >  0.95 1.00 0.90 0.99 1.03 1.00  CALCIUM 7.9*  --  7.5* 7.7*  --  8.0* 8.4*  --   MG 2.3  --  1.9 1.8  --  1.6* 1.7  --   < > = values in this interval not displayed.  Liver Function Tests: No results for input(s): AST, ALT, ALKPHOS, BILITOT, PROT, ALBUMIN in the last 168 hours. No results for input(s): LIPASE, AMYLASE in the last 168 hours. No results for input(s): AMMONIA in the last 168 hours.  CBC:  Recent Labs Lab 10/04/15 0415  10/05/15 0416 10/06/15 0530 10/06/15 1333 10/08/15 0427 10/08/15 1437  WBC 7.3  --  6.6 6.1  --  7.8  --   NEUTROABS  --   --   --   --   --  5.9  --   HGB 14.1  < > 14.3 13.7 13.9 13.3 13.6  HCT 41.0  < > 40.6 40.7 41.0 39.1 40.0  MCV 85.2  --  85.7 85.0  --  85.6  --   PLT 125*  --  129* 121*  --  144*  --   < > = values in this interval not displayed.  Cardiac Enzymes: No results for input(s): CKTOTAL, CKMB, CKMBINDEX, TROPONINI in the last 168 hours.  Lipid  Panel: No results for input(s): CHOL, TRIG, HDL, CHOLHDL, VLDL, LDLCALC in the last 168 hours.  CBG:  Recent Labs Lab 10/08/15 2006 10/09/15 1120 10/09/15 1702 10/09/15 2220 10/10/15 0651  GLUCAP 118* 112* 96 120* 95    Microbiology: Results for orders placed or performed in visit on 01/02/13  Urine culture     Status: None   Collection Time: 01/02/13 10:14 AM  Result Value Ref Range Status   Micro Text Report   Final       SOURCE: CLEA CATCH    ORGANISM 1                1000 CFU/ML GRAM NEGATIVE ROD   ANTIBIOTIC                                                      MRSA PCR Screening     Status: None   Collection Time: 01/02/13 10:25 AM  Result Value Ref Range Status   Micro Text Report   Final       COMMENT                   NEGATIVE - MRSA target DNA not detected   ANTIBIOTIC                                                        Imaging: No results found.  Assessment and plan:   Luis Byrd is an 75 y.o. male patient with Bilateral optic neuritis, currently undergoing plasmapheresis with 5th treatment today. No improvement in his vision so far. Tolerating plasmapheresis well. Now has right eye pain with has increased.  Will order MRI w/wo contrast of orbits and give 500 mg IV solumedrol one time dose.  We'll follow up

## 2015-10-10 NOTE — Progress Notes (Signed)
PT Cancellation Note  Patient Details Name: Luis Byrd MRN: [id number] DOB: 1940/07/17   Cancelled Treatment:    Reason Eval/Treat Not Completed: Patient at procedure or test/unavailable PT will check on pt later as time allows.    [name] Roneka Gilpin Vedanth Sirico, PTA Pager: [phone]   10/10/2015, 12:35 PM

## 2015-10-10 NOTE — Progress Notes (Signed)
PT Cancellation Note  Patient Details Name: Luis Byrd MRN: [id number] DOB: 1940-09-28   Cancelled Treatment:    Reason Eval/Treat Not Completed: Patient at procedure or test/unavailable PT will check on pt later as time allows.    [name] [name] [name], PTA Pager: [phone]   10/10/2015, 2:49 PM

## 2015-10-10 NOTE — Progress Notes (Signed)
Patient c/o constant pain behind right eye of 8/10 along with nausea feeling. Gingerale and pain medication given epr request. Neurology team and attending MD called. Ambulate patient back to bed at this time. Will continue to monitor.  Rhiannon Sassaman, RN.

## 2015-10-10 NOTE — Progress Notes (Signed)
[facility] Progress Note  Patient: Luis RamsayLorin B Schorsch ZOX:[id number]   PCP: Marguarite ArbourSPARKS,JEFFREY D, MD DOB: [birth year]   DOA: 10/01/2015   DOS: 10/10/2015   Date of Service: the patient was seen and examined on 10/10/2015  Subjective:  Has not regained any of his vision-- actually can see less than before -last PM began having pain in his eye  Brief hospital course: Patient was admitted on 10/01/2015, with complaint of right eye pain with vision loss in both eyes, was found to have bilateral optic neuritis. [facility]. s/p high-dose IV steroids. Also completed 3/5 sessions of TPE. (last session of 5/19)   Assessment and Plan:  Bilateral optic neuritis Treatment per neurology. Completed five-day course of 1 g steroids without taper on 10/03/2015 Plasmapheresis 5 out of 5 on 10/10/2015. improvement in vision is unlikely. ?immunosuppression-- defer to neuro -MRI orbits -IV Steroids x 1   Essential hypertension. Continue home blood pressure medications. Blood pressure stable.  Discontinue hydrochlorothiazide due to hypokalemia   Dyslipidemia. Holding statin while on fluconazole.   A. fib. Chronic anticoagulation. [place] Vasc 5 Continue warfarin, currently rate controlled  Chronic kidney disease stage II. Renal function stable continue to monitor.   Hypokalemia. Replacing orally. Discontinue hydrochlorothiazide  Daily BMP Patient has chronic hypokalemia and has been taking 40 mg potassium on a daily basis.   Oral thrush. Improving. Continue Diflucan.  Pain management: When necessary Tylenol, continue home Percocet Home health with 24 hour supervision Diet: Regular diet DVT Prophylaxis: on therapeutic anticoagulation.  Advance goals of care discussion: Full code  Family Communication:  Granddaughter at bedside  Disposition:  Discharge to home   Consultants: Neurology Procedures:  plasmapheresis  Antibiotics: Anti-infectives    Start     Dose/Rate Route Frequency Ordered Stop   10/04/15 1600  fluconazole (DIFLUCAN) tablet 100 mg     100 mg Oral Daily 10/04/15 1536         Intake/Output Summary (Last 24 hours) at 10/10/15 1017 Last data filed at 10/10/15 0926  Gross per 24 hour  Intake    360 ml  Output   1525 ml  Net  -1165 ml   Filed Weights   10/06/15 0454 10/07/15 0611 10/09/15 0546  Weight: 83.9 kg (184 lb 15.5 oz) 87 kg (191 lb 12.8 oz) 84 kg (185 lb 3 oz)    Objective: Physical Exam: Filed Vitals:   10/10/15 0301 10/10/15 0532 10/10/15 0922 10/10/15 0925  BP: 133/78 117/69 119/73   Pulse: 70 57 52 69  Temp: 97.5 F (36.4 C) 97.9 F (36.6 C) 98.2 F (36.8 C)   TempSrc: Oral Oral Oral   Resp: 18 18 20    Height:      Weight:      SpO2: 99% 98% 98%     General: Alert, Awake and Oriented to Time, Place and Person- says he is unable to see out of either eye Cardiovascular: S1 and S2 Present, no Murmur, Peripheral Pulses Present Respiratory: Bilateral Air entry equal and Decreased,  Abdomen: Bowel Sound present, Soft and no tenderness  Data Reviewed: CBC:  Recent Labs Lab 10/04/15 0415  10/05/15 0416 10/06/15 0530 10/06/15 1333 10/08/15 0427 10/08/15 1437  WBC 7.3  --  6.6 6.1  --  7.8  --   NEUTROABS  --   --   --   --   --  5.9  --   HGB 14.1  < > 14.3 13.7 13.9 13.3 13.6  HCT 41.0  < > 40.6 40.7 41.0 39.1 40.0  MCV 85.2  --  85.7 85.0  --  85.6  --   PLT 125*  --  129* 121*  --  144*  --   < > = values in this interval not displayed. Basic Metabolic Panel:  Recent Labs Lab 10/04/15 0415  10/05/15 0416 10/06/15 0530 10/06/15 1333 10/07/15 0415 10/08/15 0427 10/08/15 1437  NA 141  < > 140 137 139 139 137 137  K 2.6*  < > 2.9* 3.2* 3.5 3.8 4.3 4.2  CL 103  < > 107 104 98* 105 104 97*  CO2 27  --  26 27  --  27 26  --   GLUCOSE 147*  < > 112* 108* 105* 102* 110* 102*  BUN 28*  < > 22* 17 21* CREATININE 1.03   < > 0.95 1.00 0.90 0.99 1.03 1.00  CALCIUM 7.9*  --  7.5* 7.7*  --  8.0* 8.4*  --   MG 2.3  --  1.9 1.8  --  1.6* 1.7  --   < > = values in this interval not displayed.  Liver Function Tests: No results for input(s): AST, ALT, ALKPHOS, BILITOT, PROT, ALBUMIN in the last 168 hours. No results for input(s): LIPASE, AMYLASE in the last 168 hours. No results for input(s): AMMONIA in the last 168 hours. Coagulation Profile:  Recent Labs Lab 10/06/15 0530 10/07/15 0415 10/08/15 0427 10/09/15 0605 10/10/15 0430  INR 1.91* 2.04* 1.63* 2.02* 2.03*   Cardiac Enzymes: No results for input(s): CKTOTAL, CKMB, CKMBINDEX, TROPONINI in the last 168 hours. BNP (last 3 results) No results for input(s): PROBNP in the last 8760 hours.  CBG:  Recent Labs Lab 10/08/15 2006 10/09/15 1120 10/09/15 1702 10/09/15 2220 10/10/15 0651  GLUCAP 118* 112* 96 120* 95    Studies: No results found.   Scheduled Meds: . amLODipine  10 mg Oral BID  . digoxin  125 mcg Oral Daily  . fluconazole  100 mg Oral Daily  . heparin  1,000 Units Intracatheter Once  . heparin  1,000 Units Intracatheter Once  . heparin  1,000 Units Intracatheter Once  . heparin  3,000 Units Intravenous Once  . methylPREDNISolone (SOLU-MEDROL) injection  500 mg Intravenous Once  . polyethylene glycol  17 g Oral Daily  . potassium chloride  40 mEq Oral BID  . Warfarin - Pharmacist Dosing Inpatient   Does not apply q1800   Continuous Infusions: . citrate dextrose     PRN Meds: acetaminophen **OR** acetaminophen, acetaminophen, bisacodyl, diphenhydrAMINE, HYDROcodone-acetaminophen, ondansetron **OR** ondansetron (ZOFRAN) IV, senna-docusate, sodium chloride flush, sodium phosphate, traZODone  Time spent: 25 minutes  Author: Marlin Canary [facility] Pager: [phone]  10/10/2015 10:17 AM  If 7PM-7AM, please contact night-coverage at [email or web address], password [facility]

## 2015-10-11 LAB — GLUCOSE, CAPILLARY
GLUCOSE-CAPILLARY: 160 mg/dL — AB (ref 65–99)
GLUCOSE-CAPILLARY: 210 mg/dL — AB (ref 65–99)
Glucose-Capillary: 197 mg/dL — ABNORMAL HIGH (ref 65–99)

## 2015-10-11 LAB — PROTIME-INR
INR: 3.93 — ABNORMAL HIGH (ref 0.00–1.49)
Prothrombin Time: 37.5 seconds — ABNORMAL HIGH (ref 11.6–15.2)

## 2015-10-11 NOTE — Progress Notes (Signed)
Pt having some visual hallucinations.  Pt aware he is seeing things says "its all in my mind".  MD made aware.  Will continue to monitor. Sondra ComeSilva, Remell Giaimo M, RN

## 2015-10-11 NOTE — Progress Notes (Signed)
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 MRI concerning for inflammatory process, Abnormal signal within the left optic nerve, optic chiasm bilaterally greater on the left and along the optic tracts bilaterally greater on the left. 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 [facility].    PT Comments    Patient continues to progress toward mobility goals. Practiced use of SPC for compensatory strategy to help with navigating environment. Pt with new reports of hallucinations during ambulation and drifted R and L this session although pt demonstrated good balance throughout. Pt reported that everything looked "just black" when returned to bed and believed it was a different bed.    Follow Up Recommendations  Home health PT;Supervision/Assistance - 24 hour     Equipment Recommendations  Rolling walker with 5" wheels;3in1 (PT);Other (comment) (red tipped blind cane)    Recommendations for Other Services OT consult     Precautions / Restrictions Precautions Precautions: Fall Precaution Comments: Reports complete blindness in both eyes today Restrictions Weight Bearing Restrictions: No    Mobility  Bed Mobility Overal bed mobility: Independent                Transfers Overall transfer level: Needs assistance Equipment used: None   Sit to Stand: Min guard         General transfer comment: min guard for safety  Ambulation/Gait Ambulation/Gait assistance: Mod assist Ambulation Distance (Feet): 350 Feet Assistive device: Straight cane Gait Pattern/deviations: Step-through pattern;Decreased stride length;Drifts right/left Gait velocity: reduced   General Gait Details:  cues for cadence and bilat step length with max verbal and tactile cues for guidance; pt with tendency to drift right and left and reported seeing various objects in front of him through ambulation and then once back in bed reported that everything is "just black"   Stairs            Wheelchair Mobility    Modified Rankin (Stroke Patients Only)       Balance     Sitting balance-Leahy Scale: Normal       Standing balance-Leahy Scale: Good                      Cognition Arousal/Alertness: Awake/alert Behavior During Therapy: WFL for tasks assessed/performed Overall Cognitive Status: Within Functional Limits for tasks assessed                      Exercises      General Comments General comments (skin integrity, edema, etc.): son present beginning of session and discussed set up of home and getting blind cane      Pertinent Vitals/Pain Pain Assessment: No/denies pain    Home Living                      Prior Function            PT Goals (current goals can now be found in the care plan section) Acute Rehab PT Goals Patient Stated Goal: return to independence and take care of wife again    Frequency  Min 3X/week    PT Plan Current plan remains appropriate    Co-evaluation  End of Session Equipment Utilized During Treatment: Gait belt Activity Tolerance: Patient tolerated treatment well Patient left: with call bell/phone within reach;in bed;with bed alarm set     Time: 1309-1340 PT Time Calculation (min) (ACUTE ONLY): 31 min  Charges:  $Gait Training: 8-22 mins $Therapeutic Activity: 8-22 mins                    G Codes:      Derek Mound, PTA Pager: [phone]   10/11/2015, 2:32 PM

## 2015-10-11 NOTE — Progress Notes (Signed)
ANTICOAGULATION CONSULT NOTE - Follow Up Consult  Pharmacy Consult for Coumadin Indication: atrial fibrillation  No Known Allergies  Patient Measurements: Height: 5\' 9"  (175.3 cm) Weight: 172 lb 13.5 oz (78.4 kg) IBW/kg (Calculated) : 70.7  Vital Signs: Temp: 97.9 F (36.6 C) (05/20 0945) Temp Source: Oral (05/20 0945) BP: 123/59 mmHg (05/20 0945) Pulse Rate: 98 (05/20 0945)  Labs:  Recent Labs  10/08/15 1437 10/09/15 0605 10/10/15 0430 10/10/15 1541 10/11/15 0722  HGB 13.6  --   --  13.4  --   HCT 40.0  --   --  40.0  --   PLT  --   --   --  174  --   LABPROT  --  22.7* 22.9*  --  37.5*  INR  --  2.02* 2.03*  --  3.93*  CREATININE 1.00  --   --  1.13  --     Estimated Creatinine Clearance: 54.7 mL/min (by C-G formula based on Cr of 1.13).  Assessment: 75yom continues on coumadin for afib. INR with significant increase 2 --> 3.93 overnight and now supratherapeutic. Likely due to drug interaction with fluconazole (currently on day #8). CBC remains stable. No bleeding.  Home dose: 6mg  daily   Goal of Therapy:  INR 2-3 Monitor platelets by anticoagulation protocol: Yes   Plan:  1) No coumadin tonight 2) Daily INR  Luis Byrd, Luis Byrd 10/11/2015,10:40 AM

## 2015-10-11 NOTE — Progress Notes (Signed)
[facility] Progress Note  Patient: Luis Byrd ZOX:[id number]   PCP: Marguarite Arbour, MD DOB: [birth year]   DOA: 10/01/2015   DOS: 10/11/2015   Date of Service: the patient was seen and examined on 10/11/2015  Subjective:  No pain in the eyes  Brief hospital course: Patient was admitted on 10/01/2015, with complaint of right eye pain with vision loss in both eyes, was found to have bilateral optic neuritis. The patient was transferred here from [facility] for plasmapheresis. s/p high-dose IV steroids. Also completed 3/5 sessions of TPE. (last session of 5/19)   Assessment and Plan:  Bilateral optic neuritis Treatment per neurology. Completed five-day course of 1 g steroids without taper on 10/03/2015 Plasmapheresis 5 out of 5 on 10/10/2015. improvement in vision is unlikely. ?immunosuppression-- defer to neuro as outpatient -MRI orbits- shows progression -IV Steroids x 5 days   Essential hypertension. Continue home blood pressure medications. Blood pressure stable.  Discontinue hydrochlorothiazide due to hypokalemia   Dyslipidemia. Holding statin while on fluconazole.   A. fib. Chronic anticoagulation. [place] Vasc 5 Continue warfarin, currently rate controlled  Chronic kidney disease stage II. Renal function stable continue to monitor.   Hypokalemia. Replacing orally. Discontinue hydrochlorothiazide  Daily BMP Patient has chronic hypokalemia and has been taking 40 mg potassium on a daily basis.   Oral thrush. Improving. Continue Diflucan.  Pain management: When necessary Tylenol, continue home Percocet Home health with 24 hour supervision Diet: Regular diet DVT Prophylaxis: on therapeutic anticoagulation.  Advance goals of care discussion: Full code  Family Communication:  Granddaughter at bedside  Disposition:  Discharge to home   Consultants: Neurology Procedures: plasmapheresis  Antibiotics: Anti-infectives    Start     Dose/Rate Route Frequency  Ordered Stop   10/04/15 1600  fluconazole (DIFLUCAN) tablet 100 mg     100 mg Oral Daily 10/04/15 1536         Intake/Output Summary (Last 24 hours) at 10/11/15 1118 Last data filed at 10/11/15 0900  Gross per 24 hour  Intake   1234 ml  Output   1250 ml  Net    -16 ml   Filed Weights   10/07/15 0611 10/09/15 0546 10/11/15 0559  Weight: 87 kg (191 lb 12.8 oz) 84 kg (185 lb 3 oz) 78.4 kg (172 lb 13.5 oz)    Objective: Physical Exam: Filed Vitals:   10/10/15 2149 10/11/15 0115 10/11/15 0559 10/11/15 0945  BP: 126/71 126/77 121/64 123/59  Pulse: 82 86 91 98  Temp: 98.1 F (36.7 C) 97.9 F (36.6 C) 98.6 F (37 C) 97.9 F (36.6 C)  TempSrc: Oral Oral Oral Oral  Resp: Height:      Weight:   78.4 kg (172 lb 13.5 oz)   SpO2: 99% 99% 97% 99%    General: Alert, Awake and Oriented to Time, Place and Person-  unable to see out of either eye Cardiovascular: S1 and S2 Present, no Murmur, Peripheral Pulses Present Respiratory: Bilateral Air entry equal and Decreased,  Abdomen: Bowel Sound present, Soft and no tenderness  Data Reviewed: CBC:  Recent Labs Lab 10/05/15 0416 10/06/15 0530 10/06/15 1333 10/08/15 0427 10/08/15 1437 10/10/15 1541  WBC 6.6 6.1  --  7.8  --  6.7  NEUTROABS  --   --   --  5.9  --   --   HGB 14.3 13.7 13.9 13.3 13.6 13.4  HCT 40.6 40.7 41.0 39.1 40.0 40.0  MCV 85.7 85.0  --  85.6  --  87.1  PLT 129* 121*  --  144*  --  174   Basic Metabolic Panel:  Recent Labs Lab 10/05/15 0416 10/06/15 0530 10/06/15 1333 10/07/15 0415 10/08/15 0427 10/08/15 1437 10/10/15 1541  NA 140 137 139 139 137 137 136  K 2.9* 3.2* 3.5 3.8 4.3 4.2 4.2  CL 107 104 98* 105 104 97* 100*  CO2 26 27  --  27 26  --  28  GLUCOSE 112* 108* 105* 102* 110* 102* 165*  BUN 22* 17 21* 14 15 16 11   CREATININE 0.95 1.00 0.90 0.99 1.03 1.00 1.13  CALCIUM 7.5* 7.7*  --  8.0* 8.4*  --  9.0  MG 1.9 1.8  --  1.6* 1.7  --   --     Liver Function Tests:  Recent  Labs Lab 10/10/15 1541  AST 19  ALT 17  ALKPHOS 27*  BILITOT 0.9  PROT 5.1*  ALBUMIN 4.0   No results for input(s): LIPASE, AMYLASE in the last 168 hours. No results for input(s): AMMONIA in the last 168 hours. Coagulation Profile:  Recent Labs Lab 10/07/15 0415 10/08/15 0427 10/09/15 0605 10/10/15 0430 10/11/15 0722  INR 2.04* 1.63* 2.02* 2.03* 3.93*   Cardiac Enzymes: No results for input(s): CKTOTAL, CKMB, CKMBINDEX, TROPONINI in the last 168 hours. BNP (last 3 results) No results for input(s): PROBNP in the last 8760 hours.  CBG:  Recent Labs Lab 10/09/15 2220 10/10/15 0651 10/10/15 1822 10/10/15 2349 10/11/15 0557  GLUCAP 120* 95 195* 235* 197*    Studies: Mr Birdie HopesOrbits Wo/w Cm  10/10/2015  CLINICAL DATA:  Eye pain and optic neuritis. EXAM: MRI OF THE ORBITS WITHOUT AND WITH CONTRAST TECHNIQUE: Multiplanar, multisequence MR imaging of the orbits was performed both before and after the administration of intravenous contrast. CONTRAST:  18mL MULTIHANCE GADOBENATE DIMEGLUMINE 529 MG/ML IV SOLN COMPARISON:  10/03/2015 and 09/28/2015 FINDINGS: Left optic neuritis has evolved. There is decreased edema and swelling of the left optic nerve but new enhancement at the level of the orbital apex and pre chiasmatic segment. Enhancement at the level of the mid and superficial orbit is peripheral and increased. 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 On the right, mild discontinuous peripheral enhancement at the orbital apex is stable, but there is new nerve enhancement at and just proximal to the chiasm. Stable negative appearance of the globes and extraocular muscles. No new intracranial white matter disease to suggest intracranial demyelination. Diffusion restriction as significantly diminished in the left optic tracts  and pre ganglionic radiations. No acute infarct. 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 IMPRESSION: 1. 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 2. 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 Electronically Signed   By: Marnee SpringJonathon  Watts M.D.   On: 10/10/2015 13:18     Scheduled Meds: . amLODipine  10 mg Oral BID  . calcium gluconate IVPB  4 g Intravenous Once  . digoxin  125 mcg Oral Daily  . fluconazole  100 mg Oral Daily  . heparin  1,000 Units Intracatheter Once  . heparin  1,000 Units Intracatheter Once  . heparin  1,000 Units Intracatheter Once  . heparin  1,000 Units Intracatheter Once  . heparin  3,000 Units Intravenous Once  . methylPREDNISolone (SOLU-MEDROL) injection  250 mg Intravenous Q6H  . pantoprazole (PROTONIX) IV  40 mg  Intravenous Q24H  . polyethylene glycol  17 g Oral Daily  . potassium chloride  40 mEq Oral BID  . warfarin  6 mg Oral q1800  . Warfarin - Pharmacist Dosing Inpatient   Does not apply q1800   Continuous Infusions: . citrate dextrose     PRN Meds: acetaminophen **OR** acetaminophen, acetaminophen, bisacodyl, diphenhydrAMINE, HYDROcodone-acetaminophen, ondansetron **OR** ondansetron (ZOFRAN) IV, senna-docusate, sodium chloride flush, sodium phosphate, traZODone  Time spent: 25 minutes  Author: Marlin Canary [facility] Pager: [phone]  10/11/2015 11:18 AM  If 7PM-7AM, please contact night-coverage at [email or web address], password [facility]

## 2015-10-12 LAB — GLUCOSE, CAPILLARY
GLUCOSE-CAPILLARY: 200 mg/dL — AB (ref 65–99)
GLUCOSE-CAPILLARY: 209 mg/dL — AB (ref 65–99)
Glucose-Capillary: 134 mg/dL — ABNORMAL HIGH (ref 65–99)
Glucose-Capillary: 153 mg/dL — ABNORMAL HIGH (ref 65–99)

## 2015-10-12 LAB — BASIC METABOLIC PANEL
ANION GAP: 8 (ref 5–15)
BUN: 21 mg/dL — ABNORMAL HIGH (ref 6–20)
CO2: 25 mmol/L (ref 22–32)
Calcium: 9.2 mg/dL (ref 8.9–10.3)
Chloride: 106 mmol/L (ref 101–111)
Creatinine, Ser: 1.21 mg/dL (ref 0.61–1.24)
GFR calc Af Amer: >60 mL/min (ref >60)
GFR, EST NON AFRICAN AMERICAN: 56 mL/min — AB (ref >60)
GLUCOSE: 161 mg/dL — AB (ref 65–99)
POTASSIUM: 4.6 mmol/L (ref 3.5–5.1)
Sodium: 139 mmol/L (ref 135–145)

## 2015-10-12 LAB — CBC
HEMATOCRIT: 36.1 % — AB (ref 39.0–52.0)
Hemoglobin: 12 g/dL — ABNORMAL LOW (ref 13.0–17.0)
MCH: 29 pg (ref 26.0–34.0)
MCHC: 33.2 g/dL (ref 30.0–36.0)
MCV: 87.2 fL (ref 78.0–100.0)
PLATELETS: 155 10*3/uL (ref 150–400)
RBC: 4.14 MIL/uL — AB (ref 4.22–5.81)
RDW: 14.7 % (ref 11.5–15.5)
WBC: 9.5 10*3/uL (ref 4.0–10.5)

## 2015-10-12 LAB — MAGNESIUM: Magnesium: 1.9 mg/dL (ref 1.7–2.4)

## 2015-10-12 LAB — PROTIME-INR
INR: 4.46 — ABNORMAL HIGH (ref 0.00–1.49)
PROTHROMBIN TIME: 41.2 s — AB (ref 11.6–15.2)

## 2015-10-12 NOTE — Progress Notes (Signed)
Coumadin 6mg  given 10/11/2015, no hold placed, no instructions to hold dose.  This Clinical research associatewriter saw that a hold was placed on MAR for 10/12/2015.  Will continue to monitor. Sondra ComeSilva, Brecklyn Galvis M, RN

## 2015-10-12 NOTE — Progress Notes (Signed)
Interval History:                                                                                                                      Luis Byrd is an 75 y.o. male patient with  severe bilateral optic neuritis as described in the previous notes, with abnormal enhancement of bilateral optic nerves on the MRI of the orbits. His status post 5 sessions of plasmapheresis with no improvement. We started IV Solu-Medrol due to the severe abnormal enhancement noted on the repeat MRI studies. His toileting steroids without significant problems. No improvement continues to have complete vision loss bilaterally otherwise no other new neurological symptoms. He reports occasionally having some visual hallucinations in the form of seeing different colors without well formed objects, not sure if this is secondary to the steroids.    Past Medical History: Past Medical History  Diagnosis Date  . Hypertension   . Arthritis   . Chronic a-fib (HCC)   . CHF (congestive heart failure) (HCC)   . HLD (hyperlipidemia)   . BPH (benign prostatic hyperplasia)     Past Surgical History  Procedure Laterality Date  . Hernia repair    . Knee surgery      Family History: Family History  Problem Relation Age of Onset  . Heart attack Father   . Cancer Mother     Social History:   reports that he has quit smoking. He does not have any smokeless tobacco history on file. He reports that he does not drink alcohol or use illicit drugs.  Allergies:  No Known Allergies   Medications:                                                                                                                         Current facility-administered medications:  .  acetaminophen (TYLENOL) tablet 650 mg, 650 mg, Oral, Q6H PRN, 650 mg at 10/01/15 2001 **OR** acetaminophen (TYLENOL) suppository 650 mg, 650 mg, Rectal, Q6H PRN, Marcos Eke, PA-C .  acetaminophen (TYLENOL) tablet 650 mg, 650 mg, Oral, Q4H PRN, Rejeana Brock,  MD .  amLODipine (NORVASC) tablet 10 mg, 10 mg, Oral, BID, Sung Amabile Wertman, PA-C, 10 mg at 10/12/15 0950 .  bisacodyl (DULCOLAX) suppository 10 mg, 10 mg, Rectal, Daily PRN, Marcos Eke, PA-C, 10 mg at 10/09/15 0932 .  calcium gluconate 4 g in sodium chloride 0.9 % 250 mL IVPB, 4 g, Intravenous, Once, Rejeana Brock, MD .  citrate dextrose (ACD-A anticoagulant) solution 500 mL, 500 mL, Intravenous, Continuous, Rejeana Brock, MD, 500 mL at 10/06/15 1535 .  digoxin (LANOXIN) tablet 125 mcg, 125 mcg, Oral, Daily, Marcos Eke, PA-C, 125 mcg at 10/12/15 0950 .  diphenhydrAMINE (BENADRYL) capsule 25 mg, 25 mg, Oral, Q6H PRN, Rejeana Brock, MD .  fluconazole (DIFLUCAN) tablet 100 mg, 100 mg, Oral, Daily, Rolly Salter, MD, 100 mg at 10/12/15 0950 .  heparin injection 1,000 Units, 1,000 Units, Intracatheter, Once, Rejeana Brock, MD .  heparin injection 1,000 Units, 1,000 Units, Intracatheter, Once, Rejeana Brock, MD .  heparin injection 1,000 Units, 1,000 Units, Intracatheter, Once, Rolly Salter, MD .  heparin injection 1,000 Units, 1,000 Units, Intracatheter, Once, Rejeana Brock, MD .  heparin injection 3,000 Units, 3,000 Units, Intravenous, Once, Jeanella Craze, NP .  HYDROcodone-acetaminophen (NORCO/VICODIN) 5-325 MG per tablet 1-2 tablet, 1-2 tablet, Oral, Q4H PRN, Marcos Eke, PA-C, 2 tablet at 10/10/15 [id number] .  methylPREDNISolone sodium succinate (SOLU-MEDROL) 250 mg in sodium chloride 0.9 % 50 mL IVPB, 250 mg, Intravenous, Q6H, Ulice Dash, PA-C, 250 mg at 10/12/15 1440 .  ondansetron (ZOFRAN) tablet 4 mg, 4 mg, Oral, Q6H PRN **OR** ondansetron (ZOFRAN) injection 4 mg, 4 mg, Intravenous, Q6H PRN, Marcos Eke, PA-C, 4 mg at 10/06/15 1009 .  pantoprazole (PROTONIX) injection 40 mg, 40 mg, Intravenous, Q24H, Ulice Dash, PA-C, 40 mg at 10/11/15 2313 .  polyethylene glycol (MIRALAX / GLYCOLAX) packet 17 g, 17 g, Oral, Daily, Rolly Salter,  MD, 17 g at 10/12/15 0950 .  potassium chloride SA (K-DUR,KLOR-CON) CR tablet 40 mEq, 40 mEq, Oral, BID, Rolly Salter, MD, 40 mEq at 10/12/15 0950 .  senna-docusate (Senokot-S) tablet 1 tablet, 1 tablet, Oral, QHS PRN, Marcos Eke, PA-C, 1 tablet at 10/08/15 2030 .  sodium chloride flush (NS) 0.9 % injection 10-40 mL, 10-40 mL, Intracatheter, PRN, Rolly Salter, MD, 10 mL at 10/08/15 1836 .  sodium phosphate (FLEET) 7-19 GM/118ML enema 1 enema, 1 enema, Rectal, Daily PRN, Joseph Art, DO .  traZODone (DESYREL) tablet 25 mg, 25 mg, Oral, QHS PRN, Marcos Eke, PA-C, 25 mg at 10/09/15 2121 .  Warfarin - Pharmacist Dosing Inpatient, , Does not apply, q1800, Marquita Palms, [facility], Stopped at 10/03/15 1800   Neurologic Examination:                                                                                                     Today's Vitals   10/12/15 0615 10/12/15 0949 10/12/15 1353 10/12/15 1739  BP: 137/64 119/62 145/57 132/65  Pulse: 90 78 84 72  Temp: 97.4 F (36.3 C) 98.3 F (36.8 C) 97.5 F (36.4 C) 97.7 F (36.5 C)  TempSrc: Axillary Oral Oral Oral  Resp: 18 16 16 16   Height:      Weight:      SpO2: 99% 100% 100% 100%  PainSc:       Complete vision loss bilaterally, otherwise nonfocal rest of the neurological exam  Lab Results: Basic Metabolic Panel:  Recent Labs Lab 10/06/15 0530  10/07/15 0415 10/08/15 0427 10/08/15 1437 10/10/15 1541 10/12/15 0500  NA 137  < > 139 137 137 136 139  K 3.2*  < > 3.8 4.3 4.2 4.2 4.6  CL 104  < > 105 104 97* 100* 106  CO2 27  --  27 26  --  28 25  GLUCOSE 108*  < > 102* 110* 102* 165* 161*  BUN 17  < > 21*  CREATININE 1.00  < > 0.99 1.03 1.00 1.13 1.21  CALCIUM 7.7*  --  8.0* 8.4*  --  9.0 9.2  MG 1.8  --  1.6* 1.7  --   --  1.9  < > = values in this interval not displayed.  Liver Function Tests:  Recent Labs Lab 10/10/15 1541  AST 19  ALT 17  ALKPHOS 27*  BILITOT 0.9  PROT 5.1*  ALBUMIN 4.0    No results for input(s): LIPASE, AMYLASE in the last 168 hours. No results for input(s): AMMONIA in the last 168 hours.  CBC:  Recent Labs Lab 10/06/15 0530 10/06/15 1333 10/08/15 0427 10/08/15 1437 10/10/15 1541 10/12/15 0500  WBC 6.1  --  7.8  --  6.7 9.5  NEUTROABS  --   --  5.9  --   --   --   HGB 13.7 13.9 13.3 13.6 13.4 12.0*  HCT 40.7 41.0 39.1 40.0 40.0 36.1*  MCV 85.0  --  85.6  --  87.1 87.2  PLT 121*  --  144*  --  174 155    Cardiac Enzymes: No results for input(s): CKTOTAL, CKMB, CKMBINDEX, TROPONINI in the last 168 hours.  Lipid Panel: No results for input(s): CHOL, TRIG, HDL, CHOLHDL, VLDL, LDLCALC in the last 168 hours.  CBG:  Recent Labs Lab 10/11/15 1136 10/11/15 1656 10/12/15 0614 10/12/15 1127 10/12/15 1622  GLUCAP 160* 210* 134* 200* 209*    Microbiology: Results for orders placed or performed in visit on 01/02/13  Urine culture     Status: None   Collection Time: 01/02/13 10:14 AM  Result Value Ref Range Status   Micro Text Report   Final       SOURCE: CLEA CATCH    ORGANISM 1                1000 CFU/ML GRAM NEGATIVE ROD   ANTIBIOTIC                                                      MRSA PCR Screening     Status: None   Collection Time: 01/02/13 10:25 AM  Result Value Ref Range Status   Micro Text Report   Final       COMMENT                   NEGATIVE - MRSA target DNA not detected   ANTIBIOTIC                                                        Imaging: Mr Birdie Hopes Wo/w Cm  10/10/2015  CLINICAL DATA:  Eye pain and optic neuritis. EXAM: MRI OF THE ORBITS WITHOUT AND WITH CONTRAST TECHNIQUE: Multiplanar, multisequence MR imaging of the orbits was performed both before and after the administration of intravenous contrast. CONTRAST:  18mL MULTIHANCE GADOBENATE DIMEGLUMINE 529 MG/ML IV SOLN COMPARISON:  10/03/2015 and 09/28/2015 FINDINGS: Left optic neuritis has evolved. There is decreased edema and swelling of the left  optic nerve but new enhancement at the level of the orbital apex and pre chiasmatic segment. Enhancement at the level of the mid and superficial orbit is peripheral and increased. 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 On the right, mild discontinuous peripheral enhancement at the orbital apex is stable, but there is new nerve enhancement at and just proximal to the chiasm. Stable negative appearance of the globes and extraocular muscles. No new intracranial white matter disease to suggest intracranial demyelination. Diffusion restriction as significantly diminished in the left optic tracts and pre ganglionic radiations. No acute infarct. 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 IMPRESSION: 1. 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 2. 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 Electronically Signed   By: Marnee SpringJonathon  Watts M.D.   On: 10/10/2015 13:18    Assessment and plan:   Faye RamsayLorin B Reesor is an 75 y.o. male patient with  severe bilateral optic neuritis as described in the previous notes, with abnormal enhancement of bilateral optic nerves on the MRI of the orbits. His status post 5 sessions of plasmapheresis with no improvement. We started IV Solu-Medrol due to the severe abnormal enhancement noted on the repeat MRI studies. His toileting steroids without significant problems. No improvement continues to have complete vision loss bilaterally otherwise no other new neurological symptoms. He reports occasionally having some visual hallucinations in the form of seeing different colors without well formed objects, not sure if this is secondary to the steroids.  Complete 5 day course of IV Solu-Medrol 250 mg every 6 hours.   Dr. Amada JupiterKirkpatrick will continue to follow starting tomorrow morning.

## 2015-10-12 NOTE — Progress Notes (Signed)
ANTICOAGULATION CONSULT NOTE - Follow Up Consult  Pharmacy Consult for Coumadin Indication: atrial fibrillation  No Known Allergies  Patient Measurements: Height: 5\' 9"  (175.3 cm) Weight: 175 lb 14.8 oz (79.8 kg) IBW/kg (Calculated) : 70.7  Vital Signs: Temp: 98.3 F (36.8 C) (05/21 0949) Temp Source: Oral (05/21 0949) BP: 119/62 mmHg (05/21 0949) Pulse Rate: 78 (05/21 0949)  Labs:  Recent Labs  10/10/15 0430 10/10/15 1541 10/11/15 0722 10/12/15 0500  HGB  --  13.4  --  12.0*  HCT  --  40.0  --  36.1*  PLT  --  174  --  155  LABPROT 22.9*  --  37.5* 41.2*  INR 2.03*  --  3.93* 4.46*  CREATININE  --  1.13  --  1.21    Estimated Creatinine Clearance: 51.1 mL/min (by C-G formula based on Cr of 1.21).  Assessment: 75yom continues on coumadin for afib. INR with significant increase yesterday (2 -->3.93) - yesterday's dose intended to be held - [facility], order for coumadin was not discontinued and dose inadvertently given. No surprise that INR continues to trend up today to 4.46. He also continues on fluconazole (currently on day #9). CBC remains stable. No bleeding.  Home dose: 6mg  daily   Goal of Therapy:  INR 2-3 Monitor platelets by anticoagulation protocol: Yes   Plan:  1) No coumadin tonight 2) Daily INR  Fredrik RiggerMarkle, Ulanda Tackett Sue 10/12/2015,10:07 AM

## 2015-10-12 NOTE — Progress Notes (Signed)
[facility] Progress Note  Patient: Luis RamsayLorin B Demonte UJW:[id number]   PCP: Marguarite ArbourSPARKS,JEFFREY D, MD DOB: [birth year]   DOA: 10/01/2015   DOS: 10/12/2015   Date of Service: the patient was seen and examined on 10/12/2015  Subjective:  No pain in the eyes  Brief hospital course: Patient was admitted on 10/01/2015, with complaint of right eye pain with vision loss in both eyes, was found to have bilateral optic neuritis. The patient was [facility]. s/p high-dose IV steroids. Also completed 3/5 sessions of TPE. (last session of 5/19)   Assessment and Plan:  Bilateral optic neuritis Treatment per neurology. Completed five-day course of 1 g steroids without taper on 10/03/2015 Plasmapheresis 5 out of 5 on 10/10/2015- d/c dialysis access improvement in vision is unlikely. ?immunosuppression-- defer to neuro as outpatient -MRI orbits- shows progression -IV Steroids x 5 more days   Essential hypertension. Continue home blood pressure medications. Blood pressure stable.  Discontinue hydrochlorothiazide due to hypokalemia   Dyslipidemia. Holding statin while on fluconazole.   A. fib. Chronic anticoagulation. [place] Vasc 5 Continue warfarin, currently rate controlled  Chronic kidney disease stage II. Renal function stable continue to monitor.   Hypokalemia. Replacing orally. Discontinue hydrochlorothiazide  Daily BMP Patient has chronic hypokalemia and has been taking 40 mg potassium on a daily basis.   Oral thrush. Improving. Continue Diflucan.  Pain management: When necessary Tylenol, continue home Percocet Home health with 24 hour supervision Diet: Regular diet DVT Prophylaxis: on therapeutic anticoagulation.  Advance goals of care discussion: Full code  Family Communication:  Granddaughter at bedside 5/19, son on phone 5/20 Disposition:  Discharge to home after abx   Consultants: Neurology Procedures:  plasmapheresis  Antibiotics: Anti-infectives    Start     Dose/Rate Route Frequency Ordered Stop   10/04/15 1600  fluconazole (DIFLUCAN) tablet 100 mg     100 mg Oral Daily 10/04/15 1536         Intake/Output Summary (Last 24 hours) at 10/12/15 1134 Last data filed at 10/12/15 0956  Gross per 24 hour  Intake    500 ml  Output   1650 ml  Net  -1150 ml   Filed Weights   10/09/15 0546 10/11/15 0559 10/12/15 0435  Weight: 84 kg (185 lb 3 oz) 78.4 kg (172 lb 13.5 oz) 79.8 kg (175 lb 14.8 oz)    Objective: Physical Exam: Filed Vitals:   10/12/15 0139 10/12/15 0435 10/12/15 0615 10/12/15 0949  BP: 117/73  137/64 119/62  Pulse: 72  90 78  Temp: 97.6 F (36.4 C)  97.4 F (36.3 C) 98.3 F (36.8 C)  TempSrc: Axillary  Axillary Oral  Resp: 18  18 16   Height:      Weight:  79.8 kg (175 lb 14.8 oz)    SpO2: 98%  99% 100%    General: Alert, Awake and Oriented to Time, Place and Person-  unable to see out of either eye Cardiovascular: S1 and S2 Present, no Murmur, Peripheral Pulses Present Respiratory: Bilateral Air entry equal and Decreased,  Abdomen: Bowel Sound present, Soft and no tenderness  Data Reviewed: CBC:  Recent Labs Lab 10/06/15 0530 10/06/15 1333 10/08/15 0427 10/08/15 1437 10/10/15 1541 10/12/15 0500  WBC 6.1  --  7.8  --  6.7 9.5  NEUTROABS  --   --  5.9  --   --   --   HGB 13.7 13.9 13.3 13.6 13.4 12.0*  HCT 40.7 41.0 39.1 40.0 40.0 36.1*  MCV 85.0  --  85.6  --  87.1 87.2  PLT 121*  --  144*  --  174 155   Basic Metabolic Panel:  Recent Labs Lab 10/06/15 0530  10/07/15 0415 10/08/15 0427 10/08/15 1437 10/10/15 1541 10/12/15 0500  NA 137  < > 139 137 137 136 139  K 3.2*  < > 3.8 4.3 4.2 4.2 4.6  CL 104  < > 105 104 97* 100* 106  CO2 27  --  27 26  --  28 25  GLUCOSE 108*  < > 102* 110* 102* 165* 161*  BUN 17  < > 21*  CREATININE 1.00  < > 0.99 1.03 1.00 1.13 1.21  CALCIUM 7.7*  --  8.0* 8.4*  --  9.0 9.2  MG 1.8  --  1.6*  1.7  --   --  1.9  < > = values in this interval not displayed.  Liver Function Tests:  Recent Labs Lab 10/10/15 1541  AST 19  ALT 17  ALKPHOS 27*  BILITOT 0.9  PROT 5.1*  ALBUMIN 4.0   No results for input(s): LIPASE, AMYLASE in the last 168 hours. No results for input(s): AMMONIA in the last 168 hours. Coagulation Profile:  Recent Labs Lab 10/08/15 0427 10/09/15 0605 10/10/15 0430 10/11/15 0722 10/12/15 0500  INR 1.63* 2.02* 2.03* 3.93* 4.46*   Cardiac Enzymes: No results for input(s): CKTOTAL, CKMB, CKMBINDEX, TROPONINI in the last 168 hours. BNP (last 3 results) No results for input(s): PROBNP in the last 8760 hours.  CBG:  Recent Labs Lab 10/10/15 2349 10/11/15 0557 10/11/15 1136 10/11/15 1656 10/12/15 0614  GLUCAP 235* 197* 160* 210* 134*    Studies: No results found.   Scheduled Meds: . amLODipine  10 mg Oral BID  . calcium gluconate IVPB  4 g Intravenous Once  . digoxin  125 mcg Oral Daily  . fluconazole  100 mg Oral Daily  . heparin  1,000 Units Intracatheter Once  . heparin  1,000 Units Intracatheter Once  . heparin  1,000 Units Intracatheter Once  . heparin  1,000 Units Intracatheter Once  . heparin  3,000 Units Intravenous Once  . methylPREDNISolone (SOLU-MEDROL) injection  250 mg Intravenous Q6H  . pantoprazole (PROTONIX) IV  40 mg Intravenous Q24H  . polyethylene glycol  17 g Oral Daily  . potassium chloride  40 mEq Oral BID  . Warfarin - Pharmacist Dosing Inpatient   Does not apply q1800   Continuous Infusions: . citrate dextrose     PRN Meds: acetaminophen **OR** acetaminophen, acetaminophen, bisacodyl, diphenhydrAMINE, HYDROcodone-acetaminophen, ondansetron **OR** ondansetron (ZOFRAN) IV, senna-docusate, sodium chloride flush, sodium phosphate, traZODone  Time spent: 25 minutes  Author: Marlin Canary [facility] Pager: [phone]  10/12/2015 11:34 AM  If 7PM-7AM, please contact night-coverage at [email or web address],  password [facility]

## 2015-10-13 LAB — POCT I-STAT, CHEM 8
BUN: 13 mg/dL (ref 6–20)
CALCIUM ION: 1.22 mmol/L (ref 1.13–1.30)
CHLORIDE: 95 mmol/L — AB (ref 101–111)
Creatinine, Ser: 1 mg/dL (ref 0.61–1.24)
Glucose, Bld: 156 mg/dL — ABNORMAL HIGH (ref 65–99)
HCT: 41 % (ref 39.0–52.0)
Hemoglobin: 13.9 g/dL (ref 13.0–17.0)
Potassium: 4.2 mmol/L (ref 3.5–5.1)
SODIUM: 136 mmol/L (ref 135–145)
TCO2: 26 mmol/L (ref 0–100)

## 2015-10-13 LAB — PROTIME-INR
INR: 5.46 (ref 0.00–1.49)
Prothrombin Time: 48 seconds — ABNORMAL HIGH (ref 11.6–15.2)

## 2015-10-13 LAB — GLUCOSE, CAPILLARY
GLUCOSE-CAPILLARY: 154 mg/dL — AB (ref 65–99)
GLUCOSE-CAPILLARY: 168 mg/dL — AB (ref 65–99)
GLUCOSE-CAPILLARY: 209 mg/dL — AB (ref 65–99)
GLUCOSE-CAPILLARY: 215 mg/dL — AB (ref 65–99)

## 2015-10-13 MED ORDER — SODIUM CHLORIDE 0.9 % IV SOLN: 1000.0000 mg | Freq: Every day | INTRAVENOUS | Status: DC

## 2015-10-13 MED ORDER — PANTOPRAZOLE SODIUM 40 MG PO TBEC
40.0000 mg | DELAYED_RELEASE_TABLET | Freq: Every day | ORAL | Status: DC
  Administered 2015-10-13 – 2015-10-14 (×2): 40 mg via ORAL
  Filled 2015-10-13 (×2): qty 1

## 2015-10-13 MED ORDER — SODIUM CHLORIDE 0.9 % IV SOLN: 1000.0000 mg | INTRAVENOUS | Status: DC

## 2015-10-13 NOTE — Progress Notes (Signed)
Subjective: No eye pain with ocular movements. Has mild HA after solumedrol administered. Feels he has some perception of light but when tested showed no perception of light.   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 The pain proceeding loss of vision on the left eye was concerning for optic neuritis and imaging initially was concerning for optic neuritis as well. He then had subsequent findings associated with arterial compromise. 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 His pain improved with steroids, but he did not respond markedly to the initial steroid pulse.  At this time, given a normal ESR and CRP at the initiation of this, I think that temporal arteritis is very unlikely. If he were to redevelop headaches after stopping steroids, however, then employ artery biopsy may be necessary.  Recommendations: 1) discontinue IV Solu-Medrol 2) we'll give steroid taper starting tomorrow 3) we will continue to follow  10/13/2015, 9:01 AM

## 2015-10-13 NOTE — Care Management Note (Signed)
Case Management Note  Patient Details  Name: Luis RamsayLorin B Byrd MRN: [id number] Date of Birth: Oct 16, 1940  Subjective/Objective:                    Action/Plan: Pt continues on IV steroids. CM following for d/c needs.   Expected Discharge Date:                  Expected Discharge Plan:  Home w Home Health Services  In-House Referral:     Discharge planning Services  CM Consult  Post Acute Care Choice:  Home Health Choice offered to:  Patient  DME Arranged:    DME Agency:     HH Arranged:  PT HH Agency:  [facility]  Status of Service:  Completed, signed off  Medicare Important Message Given:  Yes Date Medicare IM Given:    Medicare IM give by:    Date Additional Medicare IM Given:    Additional Medicare Important Message give by:     If discussed at Long Length of Stay Meetings, dates discussed:    Additional Comments:  Kermit BaloKelli F Chanise Habeck, RN 10/13/2015, 11:37 AM

## 2015-10-13 NOTE — Progress Notes (Signed)
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 MRI concerning for inflammatory process, Abnormal signal within the left optic nerve, optic chiasm bilaterally greater on the left and along the optic tracts bilaterally greater on the left. 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 [facility].    PT Comments    Pt remains very unsteady due to new onset of blindness. Pt having hallucinations during ambulation but knows they are hallucinations. Pt requires modA for safe ambulation whether he uses RW or hand held assist due to being blind. Acute PT to con't to acutely follow.  Follow Up Recommendations  Home health PT;Supervision/Assistance - 24 hour     Equipment Recommendations  Rolling walker with 5" wheels;3in1 (PT);Other (comment)    Recommendations for Other Services       Precautions / Restrictions Precautions Precautions: Fall Precaution Comments: hallucination during ambulation Restrictions Weight Bearing Restrictions: No    Mobility  Bed Mobility Overal bed mobility: Independent                Transfers Overall transfer level: Needs assistance Equipment used: None Transfers: Sit to/from Stand Sit to Stand: Min guard         General transfer comment: min guard for safety due to new blindness  Ambulation/Gait Ambulation/Gait assistance: Mod assist Ambulation Distance (Feet): 300 Feet Assistive device: Rolling walker (2 wheeled) Gait Pattern/deviations: Step-through pattern;Decreased stride length Gait velocity: decreased Gait velocity interpretation: Below normal speed for age/gender General Gait Details: began hand held assist however pt with very short steps,  wide base of support and unsteady, transittioned pt to RW pt with improved gait pattern however con't to require modA for walker safety and provide directional v/c's. Pt reports "we are walking through the machinery again" pt with hallucinations but knows they aren't real   Stairs            Wheelchair Mobility    Modified Rankin (Stroke Patients Only)       Balance           Standing balance support: Single extremity supported Standing balance-Leahy Scale: Fair Standing balance comment: requires assist for amb                    Cognition Arousal/Alertness: Awake/alert Behavior During Therapy: WFL for tasks assessed/performed Overall Cognitive Status: Within Functional Limits for tasks assessed                      Exercises      General Comments        Pertinent Vitals/Pain Pain Assessment: No/denies pain    Home Living                      Prior Function            PT Goals (current goals can now be found in the care plan section) Acute Rehab PT Goals Patient Stated Goal: be indep Progress towards PT goals: Progressing toward goals    Frequency  Min 3X/week    PT Plan Current plan remains appropriate    Co-evaluation             End of Session Equipment Utilized During Treatment: Gait belt  Activity Tolerance: Patient tolerated treatment well Patient left: in bed;with call bell/phone within reach;with bed alarm set;with family/visitor present     Time: 1610-9604 PT Time Calculation (min) (ACUTE ONLY): 16 min  Charges:  $Gait Training: 8-22 mins                    G Codes:      Marcene Brawn 10/13/2015, 4:08 PM  Lewis Shock, PT, DPT Pager #: [phone] Office #: [phone]

## 2015-10-13 NOTE — Progress Notes (Signed)
[facility] Progress Note  Patient: Luis Byrd ZOX:[id number]   PCP: Marguarite Arbour, MD DOB: 1940/12/17   DOA: 10/01/2015   DOS: 10/13/2015   Date of Service: the patient was seen and examined on 10/13/2015  Subjective:  No pain in the eyes  Brief hospital course: Patient was admitted on 10/01/2015, with complaint of right eye pain with vision loss in both eyes, was found to have bilateral optic neuritis. The patient was transferred here from [facility] for plasmapheresis. s/p high-dose IV steroids. Also completed 3/5 sessions of TPE. (last session of 5/19)   Assessment and Plan:  Bilateral optic neuritis- diagnosis uncertain Treatment per neurology. Completed five-day course of 1 g steroids without taper on 10/03/2015 Plasmapheresis 5 out of 5 on 10/10/2015- d/c dialysis access improvement in vision is unlikely. ?immunosuppression-- defer to neuro as outpatient -MRI orbits- shows progression -IV Steroids x 5 more days- end date 5/24   Essential hypertension. Continue home blood pressure medications. Blood pressure stable.  Discontinue hydrochlorothiazide due to hypokalemia   Dyslipidemia. Holding statin while on fluconazole.   A. fib. Chronic anticoagulation. [place] Vasc 5  warfarin- per pharmacy-- currently high  Chronic kidney disease stage II. Renal function stable continue to monitor.  Hyperglycemia -on steroids   Hypokalemia. Replacing orally. Discontinue hydrochlorothiazide  Daily BMP Patient has chronic hypokalemia and has been taking 40 mg potassium on a daily basis.   Oral thrush. Improving. D/c Diflucan.  Constipation miralax -suppository  Pain management: When necessary Tylenol, continue home Percocet Home health with 24 hour supervision Diet: Regular diet DVT Prophylaxis: on therapeutic anticoagulation.  Advance goals of care discussion: Full code  Family Communication:  Granddaughter at bedside 5/19, son on phone 5/20 Disposition:   Discharge to home after abx   Consultants: Neurology Procedures: plasmapheresis  Antibiotics: Anti-infectives    Start     Dose/Rate Route Frequency Ordered Stop   10/04/15 1600  fluconazole (DIFLUCAN) tablet 100 mg  Status:  Discontinued     100 mg Oral Daily 10/04/15 1536 10/13/15 1040       Intake/Output Summary (Last 24 hours) at 10/13/15 1144 Last data filed at 10/13/15 0837  Gross per 24 hour  Intake    954 ml  Output   1100 ml  Net   -146 ml   Filed Weights   10/11/15 0559 10/12/15 0435 10/13/15 0500  Weight: 78.4 kg (172 lb 13.5 oz) 79.8 kg (175 lb 14.8 oz) 85.276 kg (188 lb)    Objective: Physical Exam: Filed Vitals:   10/13/15 0500 10/13/15 0632 10/13/15 0831 10/13/15 0917  BP:  128/62  128/65  Pulse:  91 84 78  Temp:  98.5 F (36.9 C)  98 F (36.7 C)  TempSrc:  Oral  Oral  Resp:  20  20  Height:      Weight: 85.276 kg (188 lb)     SpO2:  99%  98%    General: Alert, Awake and Oriented to Time, Place and Person-  unable to see out of either eye- may be seeing more of bright lights Cardiovascular: S1 and S2 Present, no Murmur, Peripheral Pulses Present Respiratory: Bilateral Air entry equal and Decreased,  Abdomen: Bowel Sound present, Soft and no tenderness  Data Reviewed: CBC:  Recent Labs Lab 10/06/15 1333 10/08/15 0427 10/08/15 1437 10/10/15 1541 10/12/15 0500  WBC  --  7.8  --  6.7 9.5  NEUTROABS  --  5.9  --   --   --   HGB 13.9 13.3  13.6 13.4 12.0*  HCT 41.0 39.1 40.0 40.0 36.1*  MCV  --  85.6  --  87.1 87.2  PLT  --  144*  --  174 155   Basic Metabolic Panel:  Recent Labs Lab 10/07/15 0415 10/08/15 0427 10/08/15 1437 10/10/15 1541 10/12/15 0500  NA 139 137 137 136 139  K 3.8 4.3 4.2 4.2 4.6  CL 105 104 97* 100* 106  CO2 27 26  --  28 25  GLUCOSE 102* 110* 102* 165* 161*  BUN 14 15 16 11  21*  CREATININE 0.99 1.03 1.00 1.13 1.21  CALCIUM 8.0* 8.4*  --  9.0 9.2  MG 1.6* 1.7  --   --  1.9    Liver Function  Tests:  Recent Labs Lab 10/10/15 1541  AST 19  ALT 17  ALKPHOS 27*  BILITOT 0.9  PROT 5.1*  ALBUMIN 4.0   No results for input(s): LIPASE, AMYLASE in the last 168 hours. No results for input(s): AMMONIA in the last 168 hours. Coagulation Profile:  Recent Labs Lab 10/09/15 0605 10/10/15 0430 10/11/15 0722 10/12/15 0500 10/13/15 0225  INR 2.02* 2.03* 3.93* 4.46* 5.46*   Cardiac Enzymes: No results for input(s): CKTOTAL, CKMB, CKMBINDEX, TROPONINI in the last 168 hours. BNP (last 3 results) No results for input(s): PROBNP in the last 8760 hours.  CBG:  Recent Labs Lab 10/12/15 1127 10/12/15 1622 10/12/15 2355 10/13/15 0629 10/13/15 1114  GLUCAP 200* 209* 153* 168* 215*    Studies: No results found.   Scheduled Meds: . amLODipine  10 mg Oral BID  . calcium gluconate IVPB  4 g Intravenous Once  . digoxin  125 mcg Oral Daily  . heparin  1,000 Units Intracatheter Once  . heparin  1,000 Units Intracatheter Once  . heparin  1,000 Units Intracatheter Once  . heparin  1,000 Units Intracatheter Once  . heparin  3,000 Units Intravenous Once  . pantoprazole  40 mg Oral QHS  . polyethylene glycol  17 g Oral Daily  . potassium chloride  40 mEq Oral BID  . Warfarin - Pharmacist Dosing Inpatient   Does not apply q1800   Continuous Infusions: . citrate dextrose     PRN Meds: acetaminophen **OR** acetaminophen, acetaminophen, bisacodyl, diphenhydrAMINE, HYDROcodone-acetaminophen, ondansetron **OR** ondansetron (ZOFRAN) IV, senna-docusate, sodium chloride flush, sodium phosphate, traZODone  Time spent: 25 minutes  Author: Marlin CanaryJessica Vann [facility] Pager: [phone]  10/13/2015 11:44 AM  If 7PM-7AM, please contact night-coverage at [email or web address], password [email or web address]

## 2015-10-13 NOTE — Progress Notes (Signed)
Critical  INR lab result called to pharmacy. No new orders. NO bleeding noted will monitor closely

## 2015-10-13 NOTE — Progress Notes (Signed)
ANTICOAGULATION CONSULT NOTE - Follow Up Consult  Pharmacy Consult for Coumadin Indication: atrial fibrillation  No Known Allergies  Patient Measurements: Height: 5\' 9"  (175.3 cm) Weight: 188 lb (85.276 kg) IBW/kg (Calculated) : 70.7  Vital Signs: Temp: 98 F (36.7 C) (05/22 0917) Temp Source: Oral (05/22 0917) BP: 128/65 mmHg (05/22 0917) Pulse Rate: 78 (05/22 0917)  Labs:  Recent Labs  10/10/15 1541 10/11/15 0722 10/12/15 0500 10/13/15 0225  HGB 13.4  --  12.0*  --   HCT 40.0  --  36.1*  --   PLT 174  --  155  --   LABPROT  --  37.5* 41.2* 48.0*  INR  --  3.93* 4.46* 5.46*  CREATININE 1.13  --  1.21  --     Estimated Creatinine Clearance: 55.3 mL/min (by C-G formula based on Cr of 1.21).  Assessment: 75yom continues on coumadin for afib. INR continues to trend up Day # 10 of Diflucan for thrush -- LOT?  Home dose: 6mg  daily   Goal of Therapy:  INR 2-3 Monitor platelets by anticoagulation protocol: Yes   Plan:  1) No coumadin tonight 2) Daily INR  Thank you Okey RegalLisa Elkin Belfield, PharmD [phone] 10/13/2015,9:53 AM

## 2015-10-14 LAB — GLUCOSE, CAPILLARY
GLUCOSE-CAPILLARY: 151 mg/dL — AB (ref 65–99)
GLUCOSE-CAPILLARY: 156 mg/dL — AB (ref 65–99)
GLUCOSE-CAPILLARY: 105 mg/dL — AB (ref 65–99)
Glucose-Capillary: 199 mg/dL — ABNORMAL HIGH (ref 65–99)

## 2015-10-14 LAB — BASIC METABOLIC PANEL
Anion gap: 6 (ref 5–15)
BUN: 35 mg/dL — AB (ref 6–20)
CALCIUM: 8.9 mg/dL (ref 8.9–10.3)
CHLORIDE: 105 mmol/L (ref 101–111)
CO2: 26 mmol/L (ref 22–32)
CREATININE: 1.25 mg/dL — AB (ref 0.61–1.24)
GFR calc non Af Amer: 54 mL/min — ABNORMAL LOW (ref >60)
Glucose, Bld: 164 mg/dL — ABNORMAL HIGH (ref 65–99)
Potassium: 4.4 mmol/L (ref 3.5–5.1)
SODIUM: 137 mmol/L (ref 135–145)

## 2015-10-14 LAB — PROTIME-INR
INR: 5.21 — AB (ref 0.00–1.49)
PROTHROMBIN TIME: 46.9 s — AB (ref 11.6–15.2)

## 2015-10-14 LAB — CBC
HEMATOCRIT: 33.6 % — AB (ref 39.0–52.0)
Hemoglobin: 11.2 g/dL — ABNORMAL LOW (ref 13.0–17.0)
MCH: 29.2 pg (ref 26.0–34.0)
MCHC: 33.3 g/dL (ref 30.0–36.0)
MCV: 87.5 fL (ref 78.0–100.0)
Platelets: 125 10*3/uL — ABNORMAL LOW (ref 150–400)
RBC: 3.84 MIL/uL — ABNORMAL LOW (ref 4.22–5.81)
RDW: 14.7 % (ref 11.5–15.5)
WBC: 5.2 10*3/uL (ref 4.0–10.5)

## 2015-10-14 MED ORDER — WHITE PETROLATUM GEL
Status: AC
  Administered 2015-10-14: 22:00:00
  Filled 2015-10-14: qty 1

## 2015-10-14 NOTE — Progress Notes (Signed)
CRITICAL VALUE ALERT  Critical value received:  INR 46.9  Date of notification:  10/14/15  Time of notification:   0330  Critical value read back:Yes.     Nurse who received alert:   Etter Sjogrenynthia Ladon Heney  MD notified (1st page):   Veronia BeetsK Shorr NP on call  Time of first page:   0330  MD notified (2nd page):  Veronia BeetsK Shorr NP on call  Time of second page:  [phone]  Responding MD:   Veronia BeetsK Shorr NP  Time MD responded:    [phone]

## 2015-10-14 NOTE — Progress Notes (Addendum)
[facility] Progress Note  Patient: Luis Byrd ZOX:[id number]   PCP: Marguarite ArbourSPARKS,JEFFREY D, MD DOB: 03-11-1941   DOA: 10/01/2015   DOS: 10/14/2015   Date of Service: the patient was seen and examined on 10/14/2015  Subjective:  No pain in the eyes  Brief hospital course: Patient was admitted on 10/01/2015, with complaint of right eye pain with vision loss in both eyes, was found to have bilateral optic neuritis. The [facility]. s/p high-dose IV steroids. Also completed 3/5 sessions of TPE. (last session of 5/19).  No improvement in vision-- occasionally can seen bright lights.     Assessment and Plan:  Bilateral optic neuritis- diagnosis uncertain? Treatment per neurology. Completed five-day course of 1 g steroids without taper on 10/03/2015 Plasmapheresis 5 out of 5 on 10/10/2015- d/c dialysis access improvement in vision is unlikely. ?immunosuppression-- defer to neuro as outpatient  -MRI orbits- shows progression -IV Steroids started again on 5/19-- changed to steroid taper on 5/23 50 mg to home dose of 10 mg daily (per neuro does not necessarily need long term steroids)   Essential hypertension. Continue home blood pressure medications. Blood pressure stable.  Discontinue hydrochlorothiazide due to hypokalemia   Dyslipidemia. Holding statin while on fluconazole.   A. fib. Chronic anticoagulation. [place] 5  warfarin- per pharmacy-- currently high  Chronic kidney disease stage II. Renal function stable continue to monitor.  Hyperglycemia -on steroids   Hypokalemia. Replacing orally. Discontinue hydrochlorothiazide  Daily BMP Patient has chronic hypokalemia and has been taking 40 mg potassium on a daily basis.   Oral thrush. Improving. D/c Diflucan.  Constipation miralax -suppository  Increased INR -trend  Pain management: When necessary Tylenol, continue home Percocet Home health with 24 hour  supervision Diet: Regular diet DVT Prophylaxis: on therapeutic anticoagulation.  Advance goals of care discussion: Full code  Family Communication:  Granddaughter at bedside 5/19, son on phone 5/20 Disposition:  D/c in AM if INR trending down   Consultants: Neurology Procedures: plasmapheresis  Antibiotics: Anti-infectives    Start     Dose/Rate Route Frequency Ordered Stop   10/04/15 1600  fluconazole (DIFLUCAN) tablet 100 mg  Status:  Discontinued     100 mg Oral Daily 10/04/15 1536 10/13/15 1040       Intake/Output Summary (Last 24 hours) at 10/14/15 1026 Last data filed at 10/14/15 0804  Gross per 24 hour  Intake    840 ml  Output   1600 ml  Net   -760 ml   Filed Weights   10/12/15 0435 10/13/15 0500 10/14/15 0400  Weight: 79.8 kg (175 lb 14.8 oz) 85.276 kg (188 lb) 77.066 kg (169 lb 14.4 oz)    Objective: Physical Exam: Filed Vitals:   10/14/15 0400 10/14/15 0508 10/14/15 0846 10/14/15 0904  BP:  120/65  174/50  Pulse:  75 71 68  Temp:  98.1 F (36.7 C)  97.5 F (36.4 C)  TempSrc:  Oral  Axillary  Resp:  18  20  Height:      Weight: 77.066 kg (169 lb 14.4 oz)     SpO2:  98%  100%    General: Alert, Awake and Oriented to Time, Place and Person-  unable to see out of either eye- may be seeing more of bright lights Cardiovascular: S1 and S2 Present, no Murmur, Peripheral Pulses Present Respiratory: Bilateral Air entry equal and Decreased,  Abdomen: Bowel Sound present, Soft and no tenderness  Data Reviewed: CBC:  Recent  Labs Lab 10/08/15 0427 10/08/15 1437 10/10/15 1456 10/10/15 1541 10/12/15 0500 10/14/15 0223  WBC 7.8  --   --  6.7 9.5 5.2  NEUTROABS 5.9  --   --   --   --   --   HGB 13.3 13.6 13.9 13.4 12.0* 11.2*  HCT 39.1 40.0 41.0 40.0 36.1* 33.6*  MCV 85.6  --   --  87.1 87.2 87.5  PLT 144*  --   --  174 155 125*   Basic Metabolic Panel:  Recent Labs Lab 10/08/15 0427 10/08/15 1437 10/10/15 1456 10/10/15 1541 10/12/15 0500  10/14/15 0223  NA 137 137 136 136 139 137  K 4.3 4.2 4.2 4.2 4.6 4.4  CL 104 97* 95* 100* 106 105  CO2 26  --   --  GLUCOSE 110* 102* 156* 165* 161* 164*  BUN 21* 35*  CREATININE 1.03 1.00 1.00 1.13 1.21 1.25*  CALCIUM 8.4*  --   --  9.0 9.2 8.9  MG 1.7  --   --   --  1.9  --     Liver Function Tests:  Recent Labs Lab 10/10/15 1541  AST 19  ALT 17  ALKPHOS 27*  BILITOT 0.9  PROT 5.1*  ALBUMIN 4.0   No results for input(s): LIPASE, AMYLASE in the last 168 hours. No results for input(s): AMMONIA in the last 168 hours. Coagulation Profile:  Recent Labs Lab 10/10/15 0430 10/11/15 0722 10/12/15 0500 10/13/15 0225 10/14/15 0223  INR 2.03* 3.93* 4.46* 5.46* 5.21*   Cardiac Enzymes: No results for input(s): CKTOTAL, CKMB, CKMBINDEX, TROPONINI in the last 168 hours. BNP (last 3 results) No results for input(s): PROBNP in the last 8760 hours.  CBG:  Recent Labs Lab 10/13/15 0629 10/13/15 1114 10/13/15 1624 10/14/15 0001 10/14/15 0632  GLUCAP 168* 215* 209* 154* 199*    Studies: No results found.   Scheduled Meds: . amLODipine  10 mg Oral BID  . calcium gluconate IVPB  4 g Intravenous Once  . digoxin  125 mcg Oral Daily  . heparin  1,000 Units Intracatheter Once  . heparin  1,000 Units Intracatheter Once  . heparin  1,000 Units Intracatheter Once  . heparin  1,000 Units Intracatheter Once  . heparin  3,000 Units Intravenous Once  . pantoprazole  40 mg Oral QHS  . polyethylene glycol  17 g Oral Daily  . potassium chloride  40 mEq Oral BID  . Warfarin - Pharmacist Dosing Inpatient   Does not apply q1800   Continuous Infusions: . citrate dextrose     PRN Meds: acetaminophen **OR** acetaminophen, acetaminophen, bisacodyl, diphenhydrAMINE, HYDROcodone-acetaminophen, ondansetron **OR** ondansetron (ZOFRAN) IV, senna-docusate, sodium chloride flush, sodium phosphate, traZODone  Time spent: 25 minutes  Author: Marlin Canary [facility] Pager: [phone]  10/14/2015 10:26 AM  If 7PM-7AM, please contact night-coverage at [email or web address], password [facility]

## 2015-10-14 NOTE — Progress Notes (Signed)
Subjective: No headache today.   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 The pain proceeding loss of vision on the left eye was concerning for optic neuritis and imaging initially was concerning for optic neuritis as well. He then had subsequent findings associated with arterial compromise. 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 His pain improved with steroids, but he did not respond markedly to the initial steroid pulse.  At this time, given a normal ESR and CRP at the initiation of this, I think that temporal arteritis is very unlikely. If he were to redevelop headaches after stopping steroids, however, then temporal artery biopsy may be necessary.  Recommendations: 1) return to previous oral prednisone dose 2) No chronic immune suppression at this time.  3) Follow up with neuro-ophthamology. 4) Neurology to sign off.   Roland Rack, MD [facility] [phone]  If 7pm- 7am, please page neurology on call as listed in [place]. 10/14/2015, 10:59 AM

## 2015-10-14 NOTE — Progress Notes (Signed)
ANTICOAGULATION CONSULT NOTE - Follow Up Consult  Pharmacy Consult for Coumadin Indication: atrial fibrillation  No Known Allergies  Patient Measurements: Height: 5\' 9"  (175.3 cm) Weight: 169 lb 14.4 oz (77.066 kg) IBW/kg (Calculated) : 70.7  Vital Signs: Temp: 97.5 F (36.4 C) (05/23 0904) Temp Source: Axillary (05/23 0904) BP: 174/50 mmHg (05/23 0904) Pulse Rate: 68 (05/23 0904)  Labs:  Recent Labs  10/12/15 0500 10/13/15 0225 10/14/15 0223  HGB 12.0*  --  11.2*  HCT 36.1*  --  33.6*  PLT 155  --  125*  LABPROT 41.2* 48.0* 46.9*  INR 4.46* 5.46* 5.21*  CREATININE 1.21  --  1.25*    Estimated Creatinine Clearance: 49.5 mL/min (by C-G formula based on Cr of 1.25).  Assessment: 75yom continues on Coumadin for afib. I INR still supra-therapeutic, but trending down  Home dose: 6mg  daily   Goal of Therapy:  INR 2-3 Monitor platelets by anticoagulation protocol: Yes   Plan:  1) No coumadin tonight 2) Daily INR  Thank you Okey RegalLisa Nakita [place], PharmD [phone] 10/14/2015,9:28 AM

## 2015-10-14 NOTE — Care Management Important Message (Signed)
Important Message  Patient Details  Name: Luis Byrd MRN: [id number] Date of Birth: Aug 02, 1940   Medicare Important Message Given:  Yes    Bernadette HoitShoffner, Kennedi Lizardo Coleman 10/14/2015, 2:13 PM

## 2015-10-15 LAB — GLUCOSE, CAPILLARY
GLUCOSE-CAPILLARY: 107 mg/dL — AB (ref 65–99)
Glucose-Capillary: 162 mg/dL — ABNORMAL HIGH (ref 65–99)

## 2015-10-15 LAB — PROTIME-INR
INR: 3.55 — AB (ref 0.00–1.49)
PROTHROMBIN TIME: 34.7 s — AB (ref 11.6–15.2)

## 2015-10-15 MED ORDER — SENNOSIDES-DOCUSATE SODIUM 8.6-50 MG PO TABS: 1.0000 | ORAL_TABLET | Freq: Every evening | ORAL | Status: DC | PRN

## 2015-10-15 MED ORDER — PREDNISONE 5 MG PO TABS: 30.0000 mg | ORAL_TABLET | Freq: Every day | ORAL | Status: DC

## 2015-10-15 MED ORDER — WARFARIN SODIUM 2 MG PO TABS: 6.0000 mg | ORAL_TABLET | Freq: Every day | ORAL | Status: DC

## 2015-10-15 MED ORDER — BISACODYL 10 MG RE SUPP: 10.0000 mg | Freq: Once | RECTAL | Status: DC

## 2015-10-15 MED ORDER — PREDNISONE 10 MG PO TABS: ORAL_TABLET | ORAL | Status: AC

## 2015-10-15 MED ORDER — PANTOPRAZOLE SODIUM 40 MG PO TBEC: 40.0000 mg | DELAYED_RELEASE_TABLET | Freq: Every day | ORAL | Status: DC

## 2015-10-15 NOTE — Discharge Summary (Signed)
Luis Byrd, is a 75 y.o. male  DOB August 04, 1940  MRN [id number].  Admission date:  10/01/2015  Admitting Physician  Waldemar Dickens, MD  Discharge Date:  10/15/2015   Primary MD  Fulton Reek D, MD  Recommendations for primary care physician for things to follow:  - Please continue to monitor INR and adjust warfarin dose as needed. - Patient will need outpatient referral for neuro-ophthalmology and rheumatology.   Admission Diagnosis  optic neuritis   Discharge Diagnosis  optic neuritis    Principal Problem:   Retinal artery occlusion, central Active Problems:   Chronic atrial fibrillation (HCC)   HTN (hypertension)   HLD (hyperlipidemia)   Neuromyelitis optica (HCC)   Optic neuritis   Chronic diastolic congestive heart failure ([place])   Encounter for central line placement      Past Medical History  Diagnosis Date  . Hypertension   . Arthritis   . Chronic a-fib ([place])   . CHF (congestive heart failure) ([place])   . HLD (hyperlipidemia)   . BPH (benign prostatic hyperplasia)     Past Surgical History  Procedure Laterality Date  . Hernia repair    . Knee surgery         History of present illness and  Hospital Course:     Kindly see H&P for history of present illness and admission details, please review complete Labs, Consult reports and Test reports for all details in brief  HPI  from the history and physical done on the day of admission 10/01/2015 Luis Byrd is a 75 y.o. male with medical history significant for HTN, CAF on Coumadin, HLD, CHF, transferred from [facility] for the treatment of optic neuritis resistant to steroid therapy, brought to [facility] for plasmapheresis. In review, he presented on 5/6 with acute vision loss and eye pain, headaches and some dizzines following left eye central retinal artery occlusion 3 weeks prior. At the time, he  had OP evaluation including 2 D echo, and carotid dopplers which were unremarkable. Symptoms were complicated peripheral right eye vision loss. 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 He was started on Solumedrol 125 mg q 6 hrs, total 12 doses. No significant improvement was noted. Dr. Tobias Alexander, Neuro is to continue his care in consultation, which includes plasmapheresis.Denies fevers, chills, night sweats, or mucositis, dysphagia, chest pain or respiratory complaints. Denies lower extremity swelling. Denies nausea, heartburn or change in bowel habits. Denies abdominal pain. Appetite is normal. Denies any dysuria. Denies abnormal skin rashes, or neuropathy. Denies any bleeding issues such as epistaxis, hematemesis, hematuria or hematochezia. Denies any recent infections. THis is the first time he experiences these symptoms. No headaches. No seizures. Denies tobacco or ETOH or recreational drugs.    Hospital Course   Patient was admitted on 10/01/2015, with complaint of right eye pain with vision loss in both eyes, was found to have bilateral optic neuritis. The patient was transferred here from [facility] for plasmapheresis.  s/p high-dose IV steroids. Also completed 3/5 sessions of TPE. (last session of 5/19). No improvement in vision. - Patient was seen by neurology, Dr. Leonel Ramsay impression as of 5/23 " impression  75 year old male with bilateral optic neuropathies. The pain proceeding loss of vision on the left eye was concerning for optic neuritis and imaging initially was concerning for optic neuritis as well. He then had subsequent findings associated with arterial compromise. 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 His pain improved with steroids, but he did not respond markedly to the initial steroid  pulse. At this time, given a normal ESR and CRP at the initiation of this, I think that temporal arteritis is very unlikely. If he were to redevelop headaches after stopping steroids, however, then temporal artery biopsy may be necessary"  Bilateral optic neuritis(uncertain diagnosis) Completed five-day course of 1 g steroids without taper on 10/03/2015 Plasmapheresis 5 out of 5 on 10/10/2015- d/c dialysis access improvement in vision is unlikely. ?immunosuppression-- defer to neuro as outpatient  -MRI orbits- shows progression -IV Steroids started again on 5/19-- changed to steroid taper on 5/23  to home dose of 10 mg daily (per neuro does not necessarily need long term steroids)  Essential hypertension. Continue home blood pressure medications. Discontinue hydrochlorothiazide due to hypokalemia  Dyslipidemia. Resume statin  A. fib. Chronic anticoagulation. [place] Vasc 5 INR was on the higher side , for which warfarin has been held, INR is 3.5 on discharge , instructed to resume warfarin tomorrow  Chronic kidney disease stage II. Renal function stable continue to monitor.  Hyperglycemia -Continue to steroid, significantly improved after stopped high dose steroids  Hypokalemia. Replacing orally. Discontinue hydrochlorothiazide  Patient has chronic hypokalemia and has been taking 40 mg potassium on twice daily  Oral thrush. Improving. D/c Diflucan.   Discharge Condition:  stable   Follow UP  Follow-up Information    Follow up with primary opthalmology. Schedule an appointment as soon as possible for a visit in 1 week.      Follow up with primary rheumatology. Schedule an appointment as soon as possible for a visit in 1 week.      Follow up with SPARKS,JEFFREY D, MD.   Specialty:  Internal Medicine   Contact information:   [place] [address] [phone]         Discharge Instructions  and  Discharge  Medications         Discharge Instructions    Diet - low sodium heart healthy    Complete by:  As directed      Discharge instructions    Complete by:  As directed   Follow with Primary MD SPARKS,JEFFREY D, MD in 7 days  - have Your INR monitored and checked by your PCP as previously done  Get CBC, CMP, 2 view Chest X ray checked  by Primary MD next visit.    Activity: As tolerated with Full fall precautions use walker/cane & assistance as needed   Disposition Home    Diet: Heart Healthy ** , with feeding assistance and aspiration precautions.  For Heart failure patients - Check your Weight same time everyday, if you gain over 2 pounds, or you develop in leg swelling, experience more shortness of breath or chest pain, call your Primary MD immediately. Follow Cardiac Low Salt Diet and 1.5 lit/day fluid  restriction.   On your next visit with your primary care physician please Get Medicines reviewed and adjusted.   Please request your Prim.MD to go over all Hospital Tests and Procedure/Radiological results at the follow up, please get all Hospital records sent to your Prim MD by signing hospital release before you go home.   If you experience worsening of your admission symptoms, develop shortness of breath, life threatening emergency, suicidal or homicidal thoughts you must seek medical attention immediately by calling 911 or calling your MD immediately  if symptoms less severe.  You Must read complete instructions/literature along with all the possible adverse reactions/side effects for all the Medicines you take and that have been prescribed to you. Take any new Medicines after you have completely understood and accpet all the possible adverse reactions/side effects.   Do not drive, operating heavy machinery, perform activities at heights, swimming or participation in water activities or provide baby sitting services if your were admitted for syncope or siezures until you have  seen by Primary MD or a Neurologist and advised to do so again.  Do not drive when taking Pain medications.    Do not take more than prescribed Pain, Sleep and Anxiety Medications  Special Instructions: If you have smoked or chewed Tobacco  in the last 2 yrs please stop smoking, stop any regular Alcohol  and or any Recreational drug use.  Wear Seat belts while driving.   Please note  You were cared for by a hospitalist during your hospital stay. If you have any questions about your discharge medications or the care you received while you were in the hospital after you are discharged, you can call the unit and asked to speak with the hospitalist on call if the hospitalist that took care of you is not available. Once you are discharged, your primary care physician will handle any further medical issues. Please note that NO REFILLS for any discharge medications will be authorized once you are discharged, as it is imperative that you return to your primary care physician (or establish a relationship with a primary care physician if you do not have one) for your aftercare needs so that they can reassess your need for medications and monitor your lab values.     Increase activity slowly    Complete by:  As directed             Medication List    STOP taking these medications        hydrochlorothiazide 25 MG tablet  Commonly known as:  HYDRODIURIL     OVER THE COUNTER MEDICATION      TAKE these medications        amLODipine 10 MG tablet  Commonly known as:  NORVASC  Take 10 mg by mouth 2 (two) times daily.     DIGOX 0.125 MG tablet  Generic drug:  digoxin  Take 125 mcg by mouth daily.     docusate sodium 50 MG capsule  Commonly known as:  COLACE  Take 150 mg by mouth at bedtime.     Glucosamine-Chondroitin 250-200 MG Tabs  Take 2 tablets by mouth daily.     oxyCODONE-acetaminophen 5-325 MG tablet  Commonly known as:  PERCOCET/ROXICET  Take 1 tablet by mouth every 6 (six)  hours as needed. For pain.     pantoprazole 40 MG tablet  Commonly known as:  PROTONIX  Take 1 tablet (40 mg total) by mouth at bedtime.     potassium chloride SA  20 MEQ tablet  Commonly known as:  K-DUR,KLOR-CON  Take 40 mEq by mouth 2 (two) times daily.     predniSONE 10 MG tablet  Commonly known as:  DELTASONE  Please take 3 tablets for 1 day, then 2 tablets for 2 days, then continue 10 mg oral daily thereafter     senna-docusate 8.6-50 MG tablet  Commonly known as:  Senokot-S  Take 1 tablet by mouth at bedtime as needed for mild constipation.     warfarin 2 MG tablet  Commonly known as:  COUMADIN  Take 3 tablets (6 mg total) by mouth daily. Take three of the 28m tablet  Start taking on:  10/16/2015     Zinc 50 MG Tabs  Take 50 mg by mouth every morning.          Diet and Activity recommendation: See Discharge Instructions above   Consults obtained -  Neurology   Major procedures and Radiology Reports - PLEASE review detailed and final reports for all details, in brief -     Ct Head Wo Contrast  09/27/2015  CLINICAL DATA:  Fever.  Loss of vision. EXAM: CT HEAD WITHOUT CONTRAST TECHNIQUE: Contiguous axial images were obtained from the base of the skull through the vertex without intravenous contrast. COMPARISON:  None. FINDINGS: There is no intracranial hemorrhage, mass or evidence of acute infarction. There is mild generalized atrophy. There is white matter hypodensity which likely chronic and due to small vessel disease. 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 IMPRESSION: No acute intracranial findings. 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 Electronically Signed   By: DAndreas NewportM.D.   On: 09/27/2015 18:43   Mr Angiogram Neck W Wo Contrast  10/07/2015  ADDENDUM REPORT: 10/07/2015 07:52 ADDENDUM: It is possible of the optic nerve/  chiasm/tract abnormality is related to infarct with wallerian degeneration. Electronically Signed   By: SGenia DelM.D.   On: 10/07/2015 07:52  10/07/2015  CLINICAL DATA:  75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 Right visual loss occurring over the past couple weeks. Subsequent encounter. EXAM: MR HEAD WITHOUT CONTRAST MR CIRCLE OF WILLIS WITHOUT CONTRAST MRA OF THE NECK WITHOUT AND WITH CONTRAST TECHNIQUE: Multiplanar, multiecho pulse sequences of the brain and surrounding structures were obtained according to standard protocol without intravenous contrast.; Angiographic images of the Circle of Willis were obtained using MRA technique without intravenous contrast.; Multiplanar and multiecho pulse sequences of the neck were obtained without and with intravenous contrast. Angiographic images of the neck were obtained using MRA technique without and with intravenous contast. CONTRAST:  9 cc MultiHance. Half dose contrast utilized secondary to GFR of 39. COMPARISON:  09/27/2015 CT.  No comparison MR. FINDINGS: MR HEAD FINDINGS Abnormal signal within the left optic nerve, optic chiasm bilaterally greater on the left and along the optic tracts bilaterally greater on the left. This includes restricted motion on diffusion imaging. Although ischemia has been clinically considered as a possible cause of visual loss, the distribution raises possibility of result of demyelinating process (neuromyelitis optica), inflammatory process, infectious process or vasculitis as a cause of the optic nerve/chiasm/ track abnormality. Tumor is a less likely consideration. The present brain MR was performed without contrast. Patient received contrast for MR angiogram of the neck. Patient has a low GFR of 39 and half dose contrast was utilized. If dedicated contrast enhanced orbital MR  were to be obtained, exam should be delayed for at least 48 hours (to avoid multiple  dosing of gadolinium in a patient with a low GFR secondary to possibility of developing nephrogenic systemic fibrosis) and patient's GFR should be rechecked prior to administration contrast (contact neuro radiologist if there is any question). Minimal exophthalmos. Symmetric normal appearance of the extra-ocular muscles. Mild to moderate nonspecific periventricular and subcortical white matter changes. Typically in a hypertensive patient of this age, these white matter changes are felt to be related to result of chronic microvascular changes. Tiny area of altered signal intensity deep dependent aspect of the left lateral ventricle without fluid level may represent a tiny calcification rather intracranial hemorrhage. Overall no intracranial hemorrhage noted. 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 Global atrophy without hydrocephalus. Minimal mucosal thickening ethmoid sinus air cells. MR CIRCLE OF WILLIS FINDINGS Artifact extends through the vertebral arteries which are patent on MR angiogram of the neck. Nonvisualized right posterior inferior cerebellar artery and left anterior inferior cerebellar artery. No significant stenosis of the basilar artery. Mild irregularity and narrowing of portions of the superior cerebellar arteries and distal posterior cerebral artery branches bilaterally. Mild to moderate narrowing distal M1 segment left middle cerebral artery. Otherwise anterior circulation without medium or large size vessel significant stenosis or occlusion. Branch vessel irregularity. No aneurysm noted. MRA NECK FINDINGS Three vessel arch. Mild to moderate narrowing proximal right subclavian artery. Plaque right carotid bifurcation without measurable stenosis of the proximal right internal carotid artery. Plaque left carotid bifurcation with 34% diameter stenosis proximal left internal carotid artery. Codominant vertebral arteries without significant  narrowing. IMPRESSION: MR HEAD Abnormal signal within the left optic nerve, optic chiasm bilaterally greater on the left and along the optic tracts bilaterally greater on the left. This includes restricted motion on diffusion imaging. Although ischemia has been clinically considered as a possible cause of visual loss, the distribution raises possibility of result of demyelinating process (neuromyelitis optica), inflammatory process, infectious process or vasculitis as a cause of the optic nerve/chiasm/ track abnormality. Tumor is a less likely consideration. Prior to considering contrast enhanced orbital MR, please see above discussion. MR CIRCLE OF WILLIS Nonvisualized right posterior inferior cerebellar artery and left anterior inferior cerebellar artery. Mild irregularity and narrowing of portions of the superior cerebellar arteries and distal posterior cerebral artery branches bilaterally. Mild to moderate narrowing distal M1 segment left middle cerebral artery. Otherwise anterior circulation without medium or large size vessel significant stenosis or occlusion. Branch vessel irregularity. MRA NECK Mild to moderate narrowing proximal right subclavian artery. Plaque right carotid bifurcation without measurable stenosis of the proximal right internal carotid artery. Plaque left carotid bifurcation with 34% diameter stenosis proximal left internal carotid artery. Codominant vertebral arteries without significant narrowing. These results will be called to the ordering clinician or representative by the Radiologist Assistant, and communication documented in the PACS or zVision Dashboard. Electronically Signed: By: Genia Del M.D. On: 09/28/2015 12:46   Mr Brain Wo Contrast  10/07/2015  ADDENDUM REPORT: 10/07/2015 07:52 ADDENDUM: It is possible of the optic nerve/ chiasm/tract abnormality is related to infarct with wallerian degeneration. Electronically Signed   By: Genia Del M.D.   On: 10/07/2015  07:52  10/07/2015  CLINICAL DATA:  75 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 Right visual loss occurring over the past couple weeks. Subsequent encounter. EXAM: MR HEAD WITHOUT CONTRAST MR CIRCLE OF WILLIS WITHOUT CONTRAST  MRA OF THE NECK WITHOUT AND WITH CONTRAST TECHNIQUE: Multiplanar, multiecho pulse sequences of the brain and surrounding structures were obtained according to standard protocol without intravenous contrast.; Angiographic images of the Circle of Willis were obtained using MRA technique without intravenous contrast.; Multiplanar and multiecho pulse sequences of the neck were obtained without and with intravenous contrast. Angiographic images of the neck were obtained using MRA technique without and with intravenous contast. CONTRAST:  9 cc MultiHance. Half dose contrast utilized secondary to GFR of 39. COMPARISON:  09/27/2015 CT.  No comparison MR. FINDINGS: MR HEAD FINDINGS Abnormal signal within the left optic nerve, optic chiasm bilaterally greater on the left and along the optic tracts bilaterally greater on the left. This includes restricted motion on diffusion imaging. Although ischemia has been clinically considered as a possible cause of visual loss, the distribution raises possibility of result of demyelinating process (neuromyelitis optica), inflammatory process, infectious process or vasculitis as a cause of the optic nerve/chiasm/ track abnormality. Tumor is a less likely consideration. The present brain MR was performed without contrast. Patient received contrast for MR angiogram of the neck. Patient has a low GFR of 39 and half dose contrast was utilized. If dedicated contrast enhanced orbital MR were to be obtained, exam should be delayed for at least 48 hours (to avoid multiple dosing of gadolinium in a patient with a low GFR secondary to possibility of developing nephrogenic systemic fibrosis) and patient's GFR should  be rechecked prior to administration contrast (contact neuro radiologist if there is any question). Minimal exophthalmos. Symmetric normal appearance of the extra-ocular muscles. Mild to moderate nonspecific periventricular and subcortical white matter changes. Typically in a hypertensive patient of this age, these white matter changes are felt to be related to result of chronic microvascular changes. Tiny area of altered signal intensity deep dependent aspect of the left lateral ventricle without fluid level may represent a tiny calcification rather intracranial hemorrhage. Overall no intracranial hemorrhage noted. 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 Global atrophy without hydrocephalus. Minimal mucosal thickening ethmoid sinus air cells. MR CIRCLE OF WILLIS FINDINGS Artifact extends through the vertebral arteries which are patent on MR angiogram of the neck. Nonvisualized right posterior inferior cerebellar artery and left anterior inferior cerebellar artery. No significant stenosis of the basilar artery. Mild irregularity and narrowing of portions of the superior cerebellar arteries and distal posterior cerebral artery branches bilaterally. Mild to moderate narrowing distal M1 segment left middle cerebral artery. Otherwise anterior circulation without medium or large size vessel significant stenosis or occlusion. Branch vessel irregularity. No aneurysm noted. MRA NECK FINDINGS Three vessel arch. Mild to moderate narrowing proximal right subclavian artery. Plaque right carotid bifurcation without measurable stenosis of the proximal right internal carotid artery. Plaque left carotid bifurcation with 34% diameter stenosis proximal left internal carotid artery. Codominant vertebral arteries without significant narrowing. IMPRESSION: MR HEAD Abnormal signal within the left optic nerve, optic chiasm bilaterally greater on the left and along the optic  tracts bilaterally greater on the left. This includes restricted motion on diffusion imaging. Although ischemia has been clinically considered as a possible cause of visual loss, the distribution raises possibility of result of demyelinating process (neuromyelitis optica), inflammatory process, infectious process or vasculitis as a cause of the optic nerve/chiasm/ track abnormality. Tumor is a less likely consideration. Prior to considering contrast enhanced orbital MR, please see above discussion. MR CIRCLE OF WILLIS Nonvisualized right posterior inferior cerebellar artery and left anterior inferior cerebellar  artery. Mild irregularity and narrowing of portions of the superior cerebellar arteries and distal posterior cerebral artery branches bilaterally. Mild to moderate narrowing distal M1 segment left middle cerebral artery. Otherwise anterior circulation without medium or large size vessel significant stenosis or occlusion. Branch vessel irregularity. MRA NECK Mild to moderate narrowing proximal right subclavian artery. Plaque right carotid bifurcation without measurable stenosis of the proximal right internal carotid artery. Plaque left carotid bifurcation with 34% diameter stenosis proximal left internal carotid artery. Codominant vertebral arteries without significant narrowing. These results will be called to the ordering clinician or representative by the Radiologist Assistant, and communication documented in the PACS or zVision Dashboard. Electronically Signed: By: Genia Del M.D. On: 09/28/2015 12:46   US Carotid Bilateral  09/25/2015  CLINICAL DATA:  Central left retinal arterial occlusion concerning for embolic disease. Visual disturbance. Hypertension. EXAM: BILATERAL CAROTID DUPLEX ULTRASOUND TECHNIQUE: Pearline Cables scale imaging, color Doppler and duplex ultrasound were performed of bilateral carotid and vertebral arteries in the neck. COMPARISON:  None. FINDINGS: Criteria: Quantification of carotid  stenosis is based on velocity parameters that correlate the residual internal carotid diameter with NASCET-based stenosis levels, using the diameter of the distal internal carotid lumen as the denominator for stenosis measurement. The following velocity measurements were obtained: RIGHT ICA:  86/21 cm/sec CCA:  65/46 cm/sec SYSTOLIC ICA/CCA RATIO:  1.0 DIASTOLIC ICA/CCA RATIO:  1.1 ECA:  229 cm/sec LEFT ICA:  78/22 cm/sec CCA:  50/35 cm/sec SYSTOLIC ICA/CCA RATIO:  1.0 DIASTOLIC ICA/CCA RATIO:  1.6 ECA:  181 cm/sec RIGHT CAROTID ARTERY: Heterogeneous right carotid bifurcation intimal thickening and atherosclerosis with partial calcification. Despite this, there is no hemodynamically significant right ICA stenosis, velocity elevation, or turbulent flow. RIGHT VERTEBRAL ARTERY:  Antegrade LEFT CAROTID ARTERY: Minor scattered left carotid atherosclerosis without hemodynamically significant stenosis, velocity elevation, or turbulent flow. LEFT VERTEBRAL ARTERY:  Antegrade IMPRESSION: Right greater than left carotid atherosclerosis. No hemodynamically significant ICA stenosis. Degree of narrowing less than 50% bilaterally. Patent antegrade vertebral flow bilaterally Electronically Signed   By: Jerilynn Mages.  Shick M.D.   On: 09/25/2015 14:40   Dg Chest Port 1 View  10/01/2015  CLINICAL DATA:  Central line placement EXAM: PORTABLE CHEST 1 VIEW COMPARISON:  None. FINDINGS: Lungs are clear.  No pleural effusion or pneumothorax. Cardiomegaly. Right IJ venous catheter terminates in the mid SVC. IMPRESSION: Right IJ venous catheter terminates in the mid SVC. No pneumothorax. Electronically Signed   By: Julian Hy M.D.   On: 10/01/2015 17:05   Mr Jodene Nam Head/brain Wo Cm  10/07/2015  ADDENDUM REPORT: 10/07/2015 07:52 ADDENDUM: It is possible of the optic nerve/ chiasm/tract abnormality is related to infarct with wallerian degeneration. Electronically Signed   By: Genia Del M.D.   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 Right visual loss occurring over the past couple weeks. Subsequent encounter. EXAM: MR HEAD WITHOUT CONTRAST MR CIRCLE OF WILLIS WITHOUT CONTRAST MRA OF THE NECK WITHOUT AND WITH CONTRAST TECHNIQUE: Multiplanar, multiecho pulse sequences of the brain and surrounding structures were obtained according to standard protocol without intravenous contrast.; Angiographic images of the Circle of Willis were obtained using MRA technique without intravenous contrast.; Multiplanar and multiecho pulse sequences of the neck were obtained without and with intravenous contrast. Angiographic images of the neck were obtained using MRA technique without and with intravenous contast. CONTRAST:  9 cc MultiHance. Half dose contrast utilized secondary  to GFR of 39. COMPARISON:  09/27/2015 CT.  No comparison MR. FINDINGS: MR HEAD FINDINGS Abnormal signal within the left optic nerve, optic chiasm bilaterally greater on the left and along the optic tracts bilaterally greater on the left. This includes restricted motion on diffusion imaging. Although ischemia has been clinically considered as a possible cause of visual loss, the distribution raises possibility of result of demyelinating process (neuromyelitis optica), inflammatory process, infectious process or vasculitis as a cause of the optic nerve/chiasm/ track abnormality. Tumor is a less likely consideration. The present brain MR was performed without contrast. Patient received contrast for MR angiogram of the neck. Patient has a low GFR of 39 and half dose contrast was utilized. If dedicated contrast enhanced orbital MR were to be obtained, exam should be delayed for at least 48 hours (to avoid multiple dosing of gadolinium in a patient with a low GFR secondary to possibility of developing nephrogenic systemic fibrosis) and patient's GFR should be rechecked prior to  administration contrast (contact neuro radiologist if there is any question). Minimal exophthalmos. Symmetric normal appearance of the extra-ocular muscles. Mild to moderate nonspecific periventricular and subcortical white matter changes. Typically in a hypertensive patient of this age, these white matter changes are felt to be related to result of chronic microvascular changes. Tiny area of altered signal intensity deep dependent aspect of the left lateral ventricle without fluid level may represent a tiny calcification rather intracranial hemorrhage. Overall no intracranial hemorrhage noted. 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 Global atrophy without hydrocephalus. Minimal mucosal thickening ethmoid sinus air cells. MR CIRCLE OF WILLIS FINDINGS Artifact extends through the vertebral arteries which are patent on MR angiogram of the neck. Nonvisualized right posterior inferior cerebellar artery and left anterior inferior cerebellar artery. No significant stenosis of the basilar artery. Mild irregularity and narrowing of portions of the superior cerebellar arteries and distal posterior cerebral artery branches bilaterally. Mild to moderate narrowing distal M1 segment left middle cerebral artery. Otherwise anterior circulation without medium or large size vessel significant stenosis or occlusion. Branch vessel irregularity. No aneurysm noted. MRA NECK FINDINGS Three vessel arch. Mild to moderate narrowing proximal right subclavian artery. Plaque right carotid bifurcation without measurable stenosis of the proximal right internal carotid artery. Plaque left carotid bifurcation with 34% diameter stenosis proximal left internal carotid artery. Codominant vertebral arteries without significant narrowing. IMPRESSION: MR HEAD Abnormal signal within the left optic nerve, optic chiasm bilaterally greater on the left and along the optic tracts bilaterally greater  on the left. This includes restricted motion on diffusion imaging. Although ischemia has been clinically considered as a possible cause of visual loss, the distribution raises possibility of result of demyelinating process (neuromyelitis optica), inflammatory process, infectious process or vasculitis as a cause of the optic nerve/chiasm/ track abnormality. Tumor is a less likely consideration. Prior to considering contrast enhanced orbital MR, please see above discussion. MR CIRCLE OF WILLIS Nonvisualized right posterior inferior cerebellar artery and left anterior inferior cerebellar artery. Mild irregularity and narrowing of portions of the superior cerebellar arteries and distal posterior cerebral artery branches bilaterally. Mild to moderate narrowing distal M1 segment left middle cerebral artery. Otherwise anterior circulation without medium or large size vessel significant stenosis or occlusion. Branch vessel irregularity. MRA NECK Mild to moderate narrowing proximal right subclavian artery. Plaque right carotid bifurcation without measurable stenosis of the proximal right internal carotid artery. Plaque left carotid bifurcation with 34% diameter stenosis proximal left internal carotid artery.  Codominant vertebral arteries without significant narrowing. These results will be called to the ordering clinician or representative by the Radiologist Assistant, and communication documented in the PACS or zVision Dashboard. Electronically Signed: By: Genia Del M.D. On: 09/28/2015 12:46   Mr Darnelle Catalan Wo/w Cm  10/10/2015  CLINICAL DATA:  Eye pain and optic neuritis. EXAM: MRI OF THE ORBITS WITHOUT AND WITH CONTRAST TECHNIQUE: Multiplanar, multisequence MR imaging of the orbits was performed both before and after the administration of intravenous contrast. CONTRAST:  34m MULTIHANCE GADOBENATE DIMEGLUMINE 529 MG/ML IV SOLN COMPARISON:  10/03/2015 and 09/28/2015 FINDINGS: Left optic neuritis has evolved. There is  decreased edema and swelling of the left optic nerve but new enhancement at the level of the orbital apex and pre chiasmatic segment. Enhancement at the level of the mid and superficial orbit is peripheral and increased. 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 On the right, mild discontinuous peripheral enhancement at the orbital apex is stable, but there is new nerve enhancement at and just proximal to the chiasm. Stable negative appearance of the globes and extraocular muscles. No new intracranial white matter disease to suggest intracranial demyelination. Diffusion restriction as significantly diminished in the left optic tracts and pre ganglionic radiations. No acute infarct. 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 IMPRESSION: 1. 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 2. 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 Electronically Signed   By: JMonte FantasiaM.D.   On: 10/10/2015 13:18   Mr ODarnelle CatalanWo/w Cm  10/03/2015  CLINICAL DATA:  Steroid resistant optic neuritis, here for plasmapheresis. Acute vision loss and eye pain, headache and dizziness Sep 27, 2015 after central retina artery occlusion. EXAM: MRI OF THE ORBITS WITHOUT AND WITH CONTRAST TECHNIQUE: Multiplanar, multisequence MR imaging of the orbits was performed both before and after the administration of intravenous contrast. CONTRAST:  241mMULTIHANCE GADOBENATE DIMEGLUMINE 529 MG/ML IV SOLN COMPARISON:  MRI head Sep 28, 2015 FINDINGS: Diffusely bright T2 signal along the LEFT optic nerve, the level of the LEFT optic chiasm. Punctate focus of T2 bright signal within LEFT optic disc. Ocular globes intact, homogeneous T2 bright signal. Lenses are located. No abnormal enhancement of the optic nerves or orbital contents though the post gadolinium sequences are mildly motion  degraded. Normal appearance of the extraocular muscles. Superior ophthalmic veins are not enlarged. Preservation of the orbital fat. Trace paranasal sinus mucosal thickening. Imaged mastoid air cells are well aerated. IMPRESSION: Severely edematous LEFT optic nerve to level the optic chiasm without enhancement. Findings favor optic nerve infarct, less likely demyelination. Electronically Signed   By: CoElon Alas.D.   On: 10/03/2015 01:28    Micro Results   No results found for this or any previous visit (from the past 240 hour(s)).     Today   Subjective:   Luis Byrd today has no headache,no chest abdominal pain,no new weakness tingling or numbness, feels much better wants to go home today.   Objective:   Blood pressure 132/62, pulse 91, temperature 98.5 F (36.9 C), temperature source Oral, resp. rate 20, height 5' 9"  (1.753 m), weight 76.476 kg (168 lb 9.6 oz), SpO2 97 %.   Intake/Output Summary (Last 24 hours) at 10/15/15 1404 Last data filed at 10/15/15 0945  Gross per 24 hour  Intake    600 ml  Output   2300 ml  Net  -1700 ml    Exam Awake Alert, Oriented x 3,  Blind Supple Neck,No JVD, Symmetrical Chest wall movement, Good air movement bilaterally, CTAB RRR,No Gallops,Rubs or new Murmurs, No Parasternal Heave +ve B.Sounds, Abd Soft, Non tender, . No Cyanosis, Clubbing or edema, No new Rash or bruise  Data Review   CBC w Diff:  Lab Results  Component Value Date   WBC 5.2 10/14/2015   WBC 5.5 01/02/2013   HGB 11.2* 10/14/2015   HGB 12.5* 01/16/2013   HCT 33.6* 10/14/2015   HCT 39.6* 01/02/2013   PLT 125* 10/14/2015   PLT 165 01/16/2013   LYMPHOPCT 16 10/08/2015   MONOPCT 7 10/08/2015   EOSPCT 1 10/08/2015   BASOPCT 0 10/08/2015    CMP:  Lab Results  Component Value Date   NA 137 10/14/2015   NA 139 01/18/2013   K 4.4 10/14/2015   K 3.2* 01/18/2013   CL 105 10/14/2015   CL 100 01/18/2013   CO2 26 10/14/2015   CO2 34* 01/18/2013   BUN  35* 10/14/2015   BUN 24* 01/18/2013   CREATININE 1.25* 10/14/2015   CREATININE 1.30 01/18/2013   PROT 5.1* 10/10/2015   ALBUMIN 4.0 10/10/2015   BILITOT 0.9 10/10/2015   ALKPHOS 27* 10/10/2015   AST 19 10/10/2015   ALT 17 10/10/2015  .   Total Time in preparing paper work, data evaluation and todays exam - 35 minutes  Luis Byrd M.D on 10/15/2015 at 2:04 PM  [facility]   Office  [phone]

## 2015-10-15 NOTE — Care Management Note (Signed)
Case Management Note  Patient Details  Name: Luis Byrd MRN: [id number] Date of Birth: 07-29-1940  Subjective/Objective:                    Action/Plan: Plan is for patient to discharge home today. Patient already set up with [facility]. CM met with the patient and went over where he would be staying and to make sure no need for DME. Pt states he has a walker and 3 in 1 at home. CM spoke with Manuela Schwartz at [facility] and informed her of the discharge plan and that MD going to add OT/Aide to [facility] orders. Will update the bedside RN.   Expected Discharge Date:                  Expected Discharge Plan:  [place]  In-House Referral:     Discharge planning Services  CM Consult  Post Acute Care Choice:  Home Health Choice offered to:  Patient  DME Arranged:   (pt refused DME) DME Agency:     HH Arranged:  PT, OT, Nurse's Aide, RN [place] Agency:  [place]  Status of Service:  Completed, signed off  Medicare Important Message Given:  Yes Date Medicare IM Given:    Medicare IM give by:    Date Additional Medicare IM Given:    Additional Medicare Important Message give by:     If discussed at [place] of Stay Meetings, dates discussed:    Additional Comments:  Pollie Friar, RN 10/15/2015, 10:42 AM

## 2015-10-15 NOTE — Progress Notes (Signed)
Physical Therapy Treatment Patient Details Name: Luis Byrd MRN: [id number] DOB: 03/17/1941 Today's Date: 10/15/2015    History of Present Illness Patient is a 75 y/o male that presents with recent history of L central retinal occlusion (2-3 weeks ago) and new onset R repipheral vision loss. MRI concerning for inflammatory process, Abnormal signal within the left optic nerve, optic chiasm bilaterally greater on the left and along the optic tracts bilaterally greater on the left. 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 Transferred to [facility] for plasmaphoresis.    PT Comments    Pt progressing well, used SPC as walking stick today and pt practiced exploring environment and following hallway. 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 PT will continue to follow.   Follow Up Recommendations  Home health PT;Supervision/Assistance - 24 hour     Equipment Recommendations  Other (comment) (walking cane for blindness)    Recommendations for Other Services       Precautions / Restrictions Precautions Precautions: Fall Precaution Comments: no vision bilateral eyes Restrictions Weight Bearing Restrictions: No    Mobility  Bed Mobility               General bed mobility comments: UP in chair upon PT arrival.   Transfers Overall transfer level: Needs assistance Equipment used: Straight cane Transfers: Sit to/from Stand Sit to Stand: Min guard         General transfer comment: min-guard for safety but pt without need for physical assist from recliner or toilet  Ambulation/Gait Ambulation/Gait assistance: Min assist Ambulation Distance (Feet): 500 Feet Assistive device: Straight cane Gait Pattern/deviations: Step-through pattern;Decreased stride length Gait velocity: decreased, worked on changing gait speeds with familiarity of environment Gait velocity interpretation: Below normal speed for age/gender General Gait  Details: used SPC as walking stick as well as balance, practiced following wall and navigating by as well as through doorways. Also began to use sense of touch for item identification such as doors and water fountains. Also worked on gentle hand held guiding and pt's ability to verbalize how family can assist him in new environments.    Stairs            Wheelchair Mobility    Modified Rankin (Stroke Patients Only)       Balance Overall balance assessment: Needs assistance Sitting-balance support: No upper extremity supported Sitting balance-Leahy Scale: Normal     Standing balance support: No upper extremity supported Standing balance-Leahy Scale: Good Standing balance comment: pt able to wt shift in standing but limitations evident and decreased ability to correct LOB                    Cognition Arousal/Alertness: Awake/alert Behavior During Therapy: WFL for tasks assessed/performed Overall Cognitive Status: Within Functional Limits for tasks assessed                      Exercises      General Comments General comments (skin integrity, edema, etc.): pt would benefit from walking cane for blindness as he does not really need SPC for balance and is really too heavy and too short to use as he practiced today      Pertinent Vitals/Pain Pain Assessment: No/denies pain    Home Living                      Prior Function  PT Goals (current goals can now be found in the care plan section) Acute Rehab PT Goals Patient Stated Goal: be indep PT Goal Formulation: With patient Time For Goal Achievement: 10/22/15 Potential to Achieve Goals: Good Progress towards PT goals: Goals met and updated - see care plan    Frequency  Min 3X/week    PT Plan Current plan remains appropriate    Co-evaluation             End of Session Equipment Utilized During Treatment: Gait belt Activity Tolerance: Patient tolerated treatment  well Patient left: Other (comment) (in bathroom with call string in hand)     Time: 0174-9449 PT Time Calculation (min) (ACUTE ONLY): 19 min  Charges:  $Gait Training: 8-22 mins                    G Codes:     Leighton Roach, PT  Acute Rehab Services  [place], [place] 10/15/2015, 12:15 PM

## 2015-10-15 NOTE — Progress Notes (Signed)
ANTICOAGULATION CONSULT NOTE - Follow Up Consult  Pharmacy Consult for Coumadin Indication: atrial fibrillation  No Known Allergies  Patient Measurements: Height: 5\' 9"  (175.3 cm) Weight: 168 lb 9.6 oz (76.476 kg) IBW/kg (Calculated) : 70.7  Vital Signs: Temp: 98.5 F (36.9 C) (05/24 0924) Temp Source: Oral (05/24 0924) BP: 132/62 mmHg (05/24 0924) Pulse Rate: 91 (05/24 0924)  Labs:  Recent Labs  10/13/15 0225 10/14/15 0223 10/15/15 0240  HGB  --  11.2*  --   HCT  --  33.6*  --   PLT  --  125*  --   LABPROT 48.0* 46.9* 34.7*  INR 5.46* 5.21* 3.55*  CREATININE  --  1.25*  --     Estimated Creatinine Clearance: 49.5 mL/min (by C-G formula based on Cr of 1.25).  Assessment: 75yom continues on Coumadin for afib. I INR still supra-therapeutic, but trending down  Home dose: 6mg  daily   Goal of Therapy:  INR 2-3 Monitor platelets by anticoagulation protocol: Yes   Plan:  1) No coumadin tonight 2) If discharged home, recommend restarting Coumadin 6 mg po daily tomorrow 3) Daily INR  Thank you Okey RegalLisa Thadius Smisek, PharmD [phone] 10/15/2015,11:12 AM

## 2015-10-15 NOTE — Progress Notes (Signed)
Pt discharged home with family, by car, assessment stable, prescriptions given, discharge instructions reviewed, all questions answered. IV removed. telemetry discontinued. Pt taken by wheelchair to exit by tech. Time of discharge: [id number]  Family and pt report he has walker at home.

## 2015-11-11 ENCOUNTER — Other Ambulatory Visit: Payer: Self-pay | Admitting: Neurology

## 2015-11-11 DIAGNOSIS — H469 Unspecified optic neuritis: Secondary | ICD-10-CM

## 2015-11-20 ENCOUNTER — Ambulatory Visit: Payer: Medicare Other

## 2015-11-20 ENCOUNTER — Ambulatory Visit: Admission: RE | Admit: 2015-11-20 | Payer: Medicare Other | Source: Ambulatory Visit

## 2015-12-03 ENCOUNTER — Ambulatory Visit
Admission: RE | Admit: 2015-12-03 | Discharge: 2015-12-03 | Disposition: A | Discharge status: Home / Self Care | Payer: Medicare Other | Source: Ambulatory Visit | Attending: Neurology | Admitting: Neurology

## 2015-12-03 DIAGNOSIS — H469 Unspecified optic neuritis: Secondary | ICD-10-CM

## 2015-12-03 DIAGNOSIS — H540 Blindness, both eyes: Secondary | ICD-10-CM | POA: Diagnosis present

## 2015-12-03 HISTORY — DX: Unspecified visual loss: H54.7

## 2015-12-03 HISTORY — DX: Cardiac arrhythmia, unspecified: I49.9

## 2015-12-03 LAB — CBC
HCT: 43.4 % (ref 40.0–52.0)
HEMOGLOBIN: 15.3 g/dL (ref 13.0–18.0)
MCH: 31.2 pg (ref 26.0–34.0)
MCHC: 35.1 g/dL (ref 32.0–36.0)
MCV: 88.7 fL (ref 80.0–100.0)
PLATELETS: 172 10*3/uL (ref 150–440)
RBC: 4.89 MIL/uL (ref 4.40–5.90)
RDW: 15.2 % — ABNORMAL HIGH (ref 11.5–14.5)
WBC: 7.6 10*3/uL (ref 3.8–10.6)

## 2015-12-03 LAB — CSF CELL COUNT WITH DIFFERENTIAL
EOS CSF: 0 %
Lymphs, CSF: 91 %
Monocyte-Macrophage-Spinal Fluid: 9 %
RBC COUNT CSF: 32 /mm3 — AB (ref 0–3)
SEGMENTED NEUTROPHILS-CSF: 0 %
TUBE #: 3
WBC CSF: 2 /mm3 (ref 0–5)

## 2015-12-03 LAB — GLUCOSE, RANDOM: Glucose, Bld: 116 mg/dL — ABNORMAL HIGH (ref 65–99)

## 2015-12-03 LAB — PROTEIN, CSF: Total  Protein, CSF: 48 mg/dL — ABNORMAL HIGH (ref 15–45)

## 2015-12-03 LAB — PROTIME-INR
INR: 1.07
Prothrombin Time: 14.1 seconds (ref 11.4–15.0)

## 2015-12-03 LAB — APTT: aPTT: 23 seconds — ABNORMAL LOW (ref 24–36)

## 2015-12-03 LAB — GLUCOSE, CSF: GLUCOSE CSF: 72 mg/dL — AB (ref 40–70)

## 2015-12-03 MED ORDER — ACETAMINOPHEN 325 MG PO TABS
650.0000 mg | ORAL_TABLET | ORAL | Status: DC | PRN
  Filled 2015-12-03: qty 2

## 2015-12-03 NOTE — Procedures (Signed)
Lumbar puncture at L3-4 without difficulty  Complications:  None  Blood Loss: none  See dictation in canopy pacs

## 2015-12-04 LAB — CYTOLOGY - NON PAP

## 2015-12-04 LAB — IGG 4: IgG, Subclass 4: 55 mg/dL (ref 2–96)

## 2015-12-05 LAB — IGG CSF INDEX
Albumin CSF-mCnc: 24 mg/dL (ref 11–48)
Albumin: 4.1 g/dL (ref 3.5–4.8)
IGG INDEX CSF: 0.8 — AB (ref 0.0–0.7)
IgG (Immunoglobin G), Serum: 546 mg/dL — ABNORMAL LOW (ref 700–1600)
IgG, CSF: 2.7 mg/dL (ref 0.0–8.6)
IgG/Alb Ratio, CSF: 0.11 (ref 0.00–0.25)

## 2015-12-05 LAB — OLIGOCLONAL BANDS, CSF + SERM

## 2015-12-06 LAB — CSF CULTURE W GRAM STAIN
Culture: NO GROWTH
Gram Stain: NONE SEEN

## 2015-12-06 LAB — CSF CULTURE

## 2015-12-08 LAB — COMP PANEL: LEUKEMIA/LYMPHOMA

## 2015-12-08 LAB — ANGIOTENSIN CONVERTING ENZYME, CSF: ANGIO CONVERT ENZYME: 1.9 U/L (ref 0.0–2.5)

## 2015-12-31 LAB — FUNGAL ORGANISM REFLEX

## 2015-12-31 LAB — FUNGUS CULTURE RESULT

## 2015-12-31 LAB — FUNGUS CULTURE WITH STAIN

## 2016-06-09 ENCOUNTER — Emergency Department: Payer: Medicare Other

## 2016-06-09 ENCOUNTER — Emergency Department: Payer: Medicare Other | Admitting: Certified Registered"

## 2016-06-09 ENCOUNTER — Inpatient Hospital Stay
Admission: EM | Admit: 2016-06-09 | Discharge: 2016-06-11 | DRG: 253 | Disposition: A | Discharge status: Home / Self Care | Payer: Medicare Other | Attending: Vascular Surgery | Admitting: Vascular Surgery

## 2016-06-09 ENCOUNTER — Encounter: Payer: Self-pay | Admitting: Emergency Medicine

## 2016-06-09 ENCOUNTER — Encounter
Admission: EM | Disposition: A | Discharge status: Home / Self Care | Payer: Self-pay | Source: Home / Self Care | Attending: Vascular Surgery

## 2016-06-09 DIAGNOSIS — N400 Enlarged prostate without lower urinary tract symptoms: Secondary | ICD-10-CM | POA: Diagnosis present

## 2016-06-09 DIAGNOSIS — Z7982 Long term (current) use of aspirin: Secondary | ICD-10-CM | POA: Diagnosis not present

## 2016-06-09 DIAGNOSIS — H547 Unspecified visual loss: Secondary | ICD-10-CM | POA: Diagnosis present

## 2016-06-09 DIAGNOSIS — Z87891 Personal history of nicotine dependence: Secondary | ICD-10-CM | POA: Diagnosis not present

## 2016-06-09 DIAGNOSIS — M62838 Other muscle spasm: Secondary | ICD-10-CM | POA: Diagnosis present

## 2016-06-09 DIAGNOSIS — I739 Peripheral vascular disease, unspecified: Secondary | ICD-10-CM | POA: Diagnosis present

## 2016-06-09 DIAGNOSIS — Z7901 Long term (current) use of anticoagulants: Secondary | ICD-10-CM

## 2016-06-09 DIAGNOSIS — M79662 Pain in left lower leg: Secondary | ICD-10-CM | POA: Diagnosis present

## 2016-06-09 DIAGNOSIS — I743 Embolism and thrombosis of arteries of the lower extremities: Principal | ICD-10-CM | POA: Diagnosis present

## 2016-06-09 DIAGNOSIS — I509 Heart failure, unspecified: Secondary | ICD-10-CM | POA: Diagnosis present

## 2016-06-09 DIAGNOSIS — I998 Other disorder of circulatory system: Secondary | ICD-10-CM | POA: Diagnosis present

## 2016-06-09 DIAGNOSIS — H469 Unspecified optic neuritis: Secondary | ICD-10-CM | POA: Diagnosis present

## 2016-06-09 DIAGNOSIS — Z8249 Family history of ischemic heart disease and other diseases of the circulatory system: Secondary | ICD-10-CM | POA: Diagnosis not present

## 2016-06-09 DIAGNOSIS — Z79899 Other long term (current) drug therapy: Secondary | ICD-10-CM

## 2016-06-09 DIAGNOSIS — Z7952 Long term (current) use of systemic steroids: Secondary | ICD-10-CM

## 2016-06-09 DIAGNOSIS — E785 Hyperlipidemia, unspecified: Secondary | ICD-10-CM | POA: Diagnosis present

## 2016-06-09 DIAGNOSIS — M79605 Pain in left leg: Secondary | ICD-10-CM | POA: Diagnosis not present

## 2016-06-09 DIAGNOSIS — I4891 Unspecified atrial fibrillation: Secondary | ICD-10-CM

## 2016-06-09 DIAGNOSIS — M6281 Muscle weakness (generalized): Secondary | ICD-10-CM

## 2016-06-09 DIAGNOSIS — I10 Essential (primary) hypertension: Secondary | ICD-10-CM

## 2016-06-09 DIAGNOSIS — I482 Chronic atrial fibrillation: Secondary | ICD-10-CM | POA: Diagnosis present

## 2016-06-09 DIAGNOSIS — I110 Hypertensive heart disease with heart failure: Secondary | ICD-10-CM | POA: Diagnosis present

## 2016-06-09 DIAGNOSIS — I7090 Unspecified atherosclerosis: Secondary | ICD-10-CM

## 2016-06-09 DIAGNOSIS — Z66 Do not resuscitate: Secondary | ICD-10-CM | POA: Diagnosis present

## 2016-06-09 HISTORY — PX: EMBOLECTOMY: SHX44

## 2016-06-09 LAB — BASIC METABOLIC PANEL
Anion gap: 12 (ref 5–15)
BUN: 20 mg/dL (ref 6–20)
CHLORIDE: 105 mmol/L (ref 101–111)
CO2: 25 mmol/L (ref 22–32)
Calcium: 9.8 mg/dL (ref 8.9–10.3)
Creatinine, Ser: 1.31 mg/dL — ABNORMAL HIGH (ref 0.61–1.24)
GFR calc non Af Amer: 51 mL/min — ABNORMAL LOW (ref >60)
GFR, EST AFRICAN AMERICAN: 59 mL/min — AB (ref >60)
Glucose, Bld: 169 mg/dL — ABNORMAL HIGH (ref 65–99)
POTASSIUM: 3.1 mmol/L — AB (ref 3.5–5.1)
SODIUM: 142 mmol/L (ref 135–145)

## 2016-06-09 LAB — CBC
HEMATOCRIT: 44.4 % (ref 40.0–52.0)
Hemoglobin: 15.6 g/dL (ref 13.0–18.0)
MCH: 30.4 pg (ref 26.0–34.0)
MCHC: 35.3 g/dL (ref 32.0–36.0)
MCV: 86.1 fL (ref 80.0–100.0)
Platelets: 191 10*3/uL (ref 150–440)
RBC: 5.15 MIL/uL (ref 4.40–5.90)
RDW: 14.5 % (ref 11.5–14.5)
WBC: 7 10*3/uL (ref 3.8–10.6)

## 2016-06-09 LAB — TYPE AND SCREEN
ABO/RH(D): AB POS
Antibody Screen: NEGATIVE

## 2016-06-09 LAB — APTT: aPTT: 27 seconds (ref 24–36)

## 2016-06-09 LAB — PROTIME-INR
INR: 1.59
Prothrombin Time: 19.1 seconds — ABNORMAL HIGH (ref 11.4–15.2)

## 2016-06-09 LAB — FIBRIN DERIVATIVES D-DIMER (ARMC ONLY): Fibrin derivatives D-dimer (ARMC): 991 — ABNORMAL HIGH (ref 0–499)

## 2016-06-09 LAB — GLUCOSE, CAPILLARY: GLUCOSE-CAPILLARY: 117 mg/dL — AB (ref 65–99)

## 2016-06-09 SURGERY — EMBOLECTOMY
Anesthesia: General | Site: Leg Lower | Laterality: Left | Wound class: Clean

## 2016-06-09 MED ORDER — PANTOPRAZOLE SODIUM 40 MG PO TBEC
40.0000 mg | DELAYED_RELEASE_TABLET | Freq: Every day | ORAL | Status: DC
  Administered 2016-06-09 – 2016-06-11 (×3): 40 mg via ORAL
  Filled 2016-06-09 (×3): qty 1

## 2016-06-09 MED ORDER — ACETAMINOPHEN 325 MG PO TABS: 325.0000 mg | ORAL_TABLET | ORAL | Status: DC | PRN

## 2016-06-09 MED ORDER — SODIUM CHLORIDE 0.9 % IV SOLN
INTRAVENOUS | Status: DC
  Administered 2016-06-09: 21:00:00 via INTRAVENOUS

## 2016-06-09 MED ORDER — MORPHINE SULFATE (PF) 4 MG/ML IV SOLN
2.0000 mg | INTRAVENOUS | Status: DC | PRN
  Administered 2016-06-10: 4 mg via INTRAVENOUS
  Filled 2016-06-09: qty 1

## 2016-06-09 MED ORDER — PROPOFOL 10 MG/ML IV BOLUS
INTRAVENOUS | Status: DC | PRN
  Administered 2016-06-09: 150 mg via INTRAVENOUS
  Administered 2016-06-09: 20 mg via INTRAVENOUS
  Administered 2016-06-09: 30 mg via INTRAVENOUS

## 2016-06-09 MED ORDER — HEPARIN (PORCINE) IN NACL 100-0.45 UNIT/ML-% IJ SOLN
1250.0000 [IU]/h | INTRAMUSCULAR | Status: DC
  Administered 2016-06-09: 1250 [IU]/h via INTRAVENOUS
  Filled 2016-06-09 (×2): qty 250

## 2016-06-09 MED ORDER — LIDOCAINE HCL (PF) 2 % IJ SOLN
INTRAMUSCULAR | Status: AC
  Filled 2016-06-09: qty 2

## 2016-06-09 MED ORDER — ESCITALOPRAM OXALATE 10 MG PO TABS
10.0000 mg | ORAL_TABLET | Freq: Every day | ORAL | Status: DC
  Administered 2016-06-09 – 2016-06-10 (×2): 10 mg via ORAL
  Filled 2016-06-09 (×2): qty 1

## 2016-06-09 MED ORDER — SODIUM CHLORIDE 0.9 % IV SOLN
INTRAVENOUS | Status: DC | PRN
  Administered 2016-06-09: 18:00:00 700 mL via INTRAMUSCULAR

## 2016-06-09 MED ORDER — CEFAZOLIN SODIUM-DEXTROSE 2-4 GM/100ML-% IV SOLN
2.0000 g | INTRAVENOUS | Status: AC
  Administered 2016-06-09: 2 g via INTRAVENOUS
  Filled 2016-06-09: qty 100

## 2016-06-09 MED ORDER — ADULT MULTIVITAMIN W/MINERALS CH
1.0000 | ORAL_TABLET | Freq: Every day | ORAL | Status: DC
  Administered 2016-06-10 – 2016-06-11 (×2): 1 via ORAL
  Filled 2016-06-09 (×2): qty 1

## 2016-06-09 MED ORDER — KETAMINE HCL 10 MG/ML IJ SOLN
INTRAMUSCULAR | Status: AC
  Filled 2016-06-09: qty 1

## 2016-06-09 MED ORDER — CEFAZOLIN SODIUM 1 G IJ SOLR
INTRAMUSCULAR | Status: AC
  Filled 2016-06-09: qty 20

## 2016-06-09 MED ORDER — ONDANSETRON HCL 4 MG/2ML IJ SOLN
INTRAMUSCULAR | Status: AC
  Filled 2016-06-09: qty 2

## 2016-06-09 MED ORDER — MAGNESIUM CITRATE PO SOLN
1.0000 | Freq: Once | ORAL | Status: DC | PRN
  Filled 2016-06-09: qty 296

## 2016-06-09 MED ORDER — MORPHINE SULFATE (PF) 4 MG/ML IV SOLN: 2.0000 mg | INTRAVENOUS | Status: DC | PRN

## 2016-06-09 MED ORDER — CEFAZOLIN IN D5W 1 GM/50ML IV SOLN
1.0000 g | Freq: Three times a day (TID) | INTRAVENOUS | Status: AC
  Administered 2016-06-10 (×3): 1 g via INTRAVENOUS
  Filled 2016-06-09 (×3): qty 50

## 2016-06-09 MED ORDER — PREDNISONE 10 MG PO TABS
10.0000 mg | ORAL_TABLET | Freq: Every day | ORAL | Status: DC
  Administered 2016-06-10 – 2016-06-11 (×2): 10 mg via ORAL
  Filled 2016-06-09 (×2): qty 1

## 2016-06-09 MED ORDER — DOCUSATE SODIUM 100 MG PO CAPS
200.0000 mg | ORAL_CAPSULE | Freq: Every day | ORAL | Status: DC
  Administered 2016-06-09 – 2016-06-10 (×2): 200 mg via ORAL
  Filled 2016-06-09 (×2): qty 2

## 2016-06-09 MED ORDER — FENTANYL CITRATE (PF) 100 MCG/2ML IJ SOLN
INTRAMUSCULAR | Status: AC
  Administered 2016-06-09: 50 ug via INTRAVENOUS
  Filled 2016-06-09: qty 2

## 2016-06-09 MED ORDER — HYDRALAZINE HCL 20 MG/ML IJ SOLN: 5.0000 mg | INTRAMUSCULAR | Status: DC | PRN

## 2016-06-09 MED ORDER — DOCUSATE SODIUM 50 MG PO CAPS
150.0000 mg | ORAL_CAPSULE | Freq: Every day | ORAL | Status: DC
  Filled 2016-06-09: qty 1

## 2016-06-09 MED ORDER — HEPARIN SODIUM (PORCINE) 1000 UNIT/ML IJ SOLN
INTRAMUSCULAR | Status: DC | PRN
  Administered 2016-06-09: 3000 [IU] via INTRAVENOUS

## 2016-06-09 MED ORDER — PHENYLEPHRINE 40 MCG/ML (10ML) SYRINGE FOR IV PUSH (FOR BLOOD PRESSURE SUPPORT)
PREFILLED_SYRINGE | INTRAVENOUS | Status: AC
  Filled 2016-06-09: qty 10

## 2016-06-09 MED ORDER — LIDOCAINE HCL (PF) 2 % IJ SOLN
INTRAMUSCULAR | Status: DC | PRN
  Administered 2016-06-09: 50 mg

## 2016-06-09 MED ORDER — FENTANYL CITRATE (PF) 100 MCG/2ML IJ SOLN
INTRAMUSCULAR | Status: AC
  Filled 2016-06-09: qty 2

## 2016-06-09 MED ORDER — LABETALOL HCL 5 MG/ML IV SOLN: 10.0000 mg | INTRAVENOUS | Status: DC | PRN

## 2016-06-09 MED ORDER — ACETAMINOPHEN 10 MG/ML IV SOLN
INTRAVENOUS | Status: AC
Start: 2016-06-09 — End: 2016-06-09
  Filled 2016-06-09: qty 100

## 2016-06-09 MED ORDER — SORBITOL 70 % SOLN
30.0000 mL | Freq: Every day | Status: DC | PRN
  Filled 2016-06-09: qty 30

## 2016-06-09 MED ORDER — POTASSIUM CHLORIDE CRYS ER 20 MEQ PO TBCR
40.0000 meq | EXTENDED_RELEASE_TABLET | Freq: Once | ORAL | Status: AC
  Administered 2016-06-09: 40 meq via ORAL
  Filled 2016-06-09: qty 2

## 2016-06-09 MED ORDER — KETAMINE HCL 10 MG/ML IJ SOLN
INTRAMUSCULAR | Status: DC | PRN
  Administered 2016-06-09: 25 mg via INTRAVENOUS

## 2016-06-09 MED ORDER — HEPARIN SODIUM (PORCINE) 5000 UNIT/ML IJ SOLN: 4000.0000 [IU] | Freq: Once | INTRAMUSCULAR | Status: DC

## 2016-06-09 MED ORDER — ALUM & MAG HYDROXIDE-SIMETH 200-200-20 MG/5ML PO SUSP: 15.0000 mL | ORAL | Status: DC | PRN

## 2016-06-09 MED ORDER — SODIUM CHLORIDE 0.9 % IV SOLN
INTRAVENOUS | Status: DC
  Administered 2016-06-09: 1000 mL via INTRAVENOUS

## 2016-06-09 MED ORDER — MORPHINE SULFATE (PF) 2 MG/ML IV SOLN
2.0000 mg | Freq: Once | INTRAVENOUS | Status: AC
  Administered 2016-06-09: 2 mg via INTRAVENOUS

## 2016-06-09 MED ORDER — HEPARIN (PORCINE) IN NACL 100-0.45 UNIT/ML-% IJ SOLN: 14.0000 [IU]/kg/h | Freq: Once | INTRAMUSCULAR | Status: DC

## 2016-06-09 MED ORDER — HEPARIN SODIUM (PORCINE) 1000 UNIT/ML IJ SOLN
INTRAMUSCULAR | Status: AC
  Filled 2016-06-09: qty 1

## 2016-06-09 MED ORDER — ONDANSETRON HCL 4 MG/2ML IJ SOLN: 4.0000 mg | Freq: Four times a day (QID) | INTRAMUSCULAR | Status: DC | PRN

## 2016-06-09 MED ORDER — HEPARIN BOLUS VIA INFUSION
4000.0000 [IU] | Freq: Once | INTRAVENOUS | Status: AC
  Administered 2016-06-09: 4000 [IU] via INTRAVENOUS
  Filled 2016-06-09: qty 4000

## 2016-06-09 MED ORDER — FENTANYL CITRATE (PF) 100 MCG/2ML IJ SOLN
25.0000 ug | INTRAMUSCULAR | Status: DC | PRN
  Administered 2016-06-09 (×2): 50 ug via INTRAVENOUS

## 2016-06-09 MED ORDER — MORPHINE SULFATE (PF) 2 MG/ML IV SOLN
INTRAVENOUS | Status: AC
  Administered 2016-06-09: 2 mg via INTRAVENOUS
  Filled 2016-06-09: qty 1

## 2016-06-09 MED ORDER — LACTATED RINGERS IV SOLN
INTRAVENOUS | Status: DC | PRN
  Administered 2016-06-09: 16:00:00 via INTRAVENOUS

## 2016-06-09 MED ORDER — EPHEDRINE SULFATE 50 MG/ML IJ SOLN
INTRAMUSCULAR | Status: DC | PRN
  Administered 2016-06-09 (×2): 10 mg via INTRAVENOUS

## 2016-06-09 MED ORDER — FENTANYL CITRATE (PF) 100 MCG/2ML IJ SOLN
INTRAMUSCULAR | Status: DC | PRN
  Administered 2016-06-09 (×2): 50 ug via INTRAVENOUS
  Administered 2016-06-09 (×2): 25 ug via INTRAVENOUS
  Administered 2016-06-09: 50 ug via INTRAVENOUS

## 2016-06-09 MED ORDER — DIGOXIN 125 MCG PO TABS
125.0000 ug | ORAL_TABLET | Freq: Every day | ORAL | Status: DC
  Administered 2016-06-10 – 2016-06-11 (×2): 125 ug via ORAL
  Filled 2016-06-09 (×2): qty 1

## 2016-06-09 MED ORDER — POLYETHYLENE GLYCOL 3350 17 G PO PACK: 17.0000 g | PACK | Freq: Every day | ORAL | Status: DC | PRN

## 2016-06-09 MED ORDER — CLOPIDOGREL BISULFATE 75 MG PO TABS
75.0000 mg | ORAL_TABLET | Freq: Every day | ORAL | Status: DC
  Administered 2016-06-11: 75 mg via ORAL
  Filled 2016-06-09: qty 1

## 2016-06-09 MED ORDER — AMLODIPINE BESYLATE 10 MG PO TABS
10.0000 mg | ORAL_TABLET | Freq: Two times a day (BID) | ORAL | Status: DC
  Administered 2016-06-09 – 2016-06-11 (×4): 10 mg via ORAL
  Filled 2016-06-09 (×4): qty 1

## 2016-06-09 MED ORDER — ACETAMINOPHEN 10 MG/ML IV SOLN
INTRAVENOUS | Status: DC | PRN
  Administered 2016-06-09: 1000 mg via INTRAVENOUS

## 2016-06-09 MED ORDER — EPHEDRINE 5 MG/ML INJ
INTRAVENOUS | Status: AC
  Filled 2016-06-09: qty 10

## 2016-06-09 MED ORDER — ONDANSETRON HCL 4 MG/2ML IJ SOLN
4.0000 mg | Freq: Once | INTRAMUSCULAR | Status: AC
  Administered 2016-06-09: 4 mg via INTRAVENOUS

## 2016-06-09 MED ORDER — ONDANSETRON HCL 4 MG/2ML IJ SOLN
4.0000 mg | Freq: Four times a day (QID) | INTRAMUSCULAR | Status: DC | PRN
  Administered 2016-06-10: 4 mg via INTRAVENOUS
  Filled 2016-06-09: qty 2

## 2016-06-09 MED ORDER — CYCLOBENZAPRINE HCL 10 MG PO TABS
10.0000 mg | ORAL_TABLET | Freq: Once | ORAL | Status: AC
  Administered 2016-06-09: 10 mg via ORAL
  Filled 2016-06-09: qty 1

## 2016-06-09 MED ORDER — ONDANSETRON HCL 4 MG/2ML IJ SOLN: 4.0000 mg | Freq: Once | INTRAMUSCULAR | Status: DC | PRN

## 2016-06-09 MED ORDER — PROPOFOL 10 MG/ML IV BOLUS
INTRAVENOUS | Status: AC
  Filled 2016-06-09: qty 20

## 2016-06-09 MED ORDER — POTASSIUM CHLORIDE CRYS ER 20 MEQ PO TBCR
40.0000 meq | EXTENDED_RELEASE_TABLET | Freq: Two times a day (BID) | ORAL | Status: DC
  Administered 2016-06-09 – 2016-06-11 (×4): 40 meq via ORAL
  Filled 2016-06-09 (×5): qty 2

## 2016-06-09 MED ORDER — METOPROLOL TARTRATE 5 MG/5ML IV SOLN
5.0000 mg | Freq: Four times a day (QID) | INTRAVENOUS | Status: DC
  Administered 2016-06-09 – 2016-06-11 (×7): 5 mg via INTRAVENOUS
  Filled 2016-06-09 (×7): qty 5

## 2016-06-09 MED ORDER — HYDROCHLOROTHIAZIDE 25 MG PO TABS
25.0000 mg | ORAL_TABLET | Freq: Two times a day (BID) | ORAL | Status: DC
  Administered 2016-06-09 – 2016-06-11 (×4): 25 mg via ORAL
  Filled 2016-06-09 (×4): qty 1

## 2016-06-09 MED ORDER — ACETAMINOPHEN 650 MG RE SUPP: 325.0000 mg | RECTAL | Status: DC | PRN

## 2016-06-09 MED ORDER — CYCLOBENZAPRINE HCL 10 MG PO TABS: 10.0000 mg | ORAL_TABLET | Freq: Three times a day (TID) | ORAL | 0 refills | Status: DC | PRN

## 2016-06-09 MED ORDER — GLYCOPYRROLATE 0.2 MG/ML IJ SOLN
INTRAMUSCULAR | Status: DC | PRN
  Administered 2016-06-09: 0.1 mg via INTRAVENOUS

## 2016-06-09 MED ORDER — OXYCODONE HCL 5 MG PO TABS
5.0000 mg | ORAL_TABLET | ORAL | Status: DC | PRN
  Administered 2016-06-10 (×2): 5 mg via ORAL
  Administered 2016-06-11 (×2): 10 mg via ORAL
  Filled 2016-06-09: qty 2
  Filled 2016-06-09 (×2): qty 1
  Filled 2016-06-09: qty 2

## 2016-06-09 MED ORDER — HEPARIN (PORCINE) IN NACL 100-0.45 UNIT/ML-% IJ SOLN
1250.0000 [IU]/h | INTRAMUSCULAR | Status: DC
  Administered 2016-06-09 – 2016-06-10 (×2): 1250 [IU]/h via INTRAVENOUS
  Filled 2016-06-09: qty 250

## 2016-06-09 MED ORDER — PHENYLEPHRINE HCL 10 MG/ML IJ SOLN
INTRAMUSCULAR | Status: DC | PRN
  Administered 2016-06-09 (×5): 80 ug via INTRAVENOUS

## 2016-06-09 MED ORDER — ONDANSETRON HCL 4 MG/2ML IJ SOLN
INTRAMUSCULAR | Status: DC | PRN
  Administered 2016-06-09: 4 mg via INTRAVENOUS

## 2016-06-09 MED ORDER — CLOPIDOGREL BISULFATE 75 MG PO TABS
300.0000 mg | ORAL_TABLET | Freq: Once | ORAL | Status: AC
  Administered 2016-06-09: 300 mg via ORAL
  Filled 2016-06-09: qty 4

## 2016-06-09 SURGICAL SUPPLY — 68 items
APPLIER CLIP 11 MED OPEN (CLIP)
APPLIER CLIP 13 LRG OPEN (CLIP)
APPLIER CLIP 9.375 SM OPEN (CLIP)
BAG DECANTER FOR FLEXI CONT (MISCELLANEOUS) ×3 IMPLANT
BAG ISOLATATION DRAPE 20X20 ST (DRAPES) ×1 IMPLANT
BLADE SURG SZ11 CARB STEEL (BLADE) ×3 IMPLANT
BNDG COHESIVE 6X5 TAN STRL LF (GAUZE/BANDAGES/DRESSINGS) ×3 IMPLANT
BOOT SUTURE AID YELLOW STND (SUTURE) ×6 IMPLANT
BRUSH SCRUB 4% CHG (MISCELLANEOUS) IMPLANT
CANISTER SUCT 1200ML W/VALVE (MISCELLANEOUS) ×3 IMPLANT
CATH EMBOLECTOMY 3X80 (CATHETERS) ×3 IMPLANT
CATH FOGERTY 4X80 WAS (CATHETERS) ×3 IMPLANT
CLIP APPLIE 11 MED OPEN (CLIP) IMPLANT
CLIP APPLIE 13 LRG OPEN (CLIP) IMPLANT
CLIP APPLIE 9.375 SM OPEN (CLIP) IMPLANT
COVER LIGHT HANDLE STERIS (MISCELLANEOUS) ×3 IMPLANT
DERMABOND ADVANCED (GAUZE/BANDAGES/DRESSINGS) ×2
DERMABOND ADVANCED .7 DNX12 (GAUZE/BANDAGES/DRESSINGS) ×1 IMPLANT
DRAPE IMP U-DRAPE 54X76 (DRAPES) ×3 IMPLANT
DRAPE INCISE IOBAN 66X45 STRL (DRAPES) ×3 IMPLANT
DRAPE ISOLATE BAG 20X20 STRL (DRAPES) ×2
DRAPE SHEET LG 3/4 BI-LAMINATE (DRAPES) ×3 IMPLANT
DRESSING SURGICEL FIBRLLR 1X2 (HEMOSTASIS) ×1 IMPLANT
DRSG SURGICEL FIBRILLAR 1X2 (HEMOSTASIS) ×3
DURAPREP 26ML APPLICATOR (WOUND CARE) ×6 IMPLANT
ELECT CAUTERY BLADE 6.4 (BLADE) ×3 IMPLANT
ELECT REM PT RETURN 9FT ADLT (ELECTROSURGICAL) ×3
ELECTRODE REM PT RTRN 9FT ADLT (ELECTROSURGICAL) ×1 IMPLANT
GLOVE BIO SURGEON STRL SZ 6.5 (GLOVE) ×2 IMPLANT
GLOVE BIO SURGEONS STRL SZ 6.5 (GLOVE) ×1
GLOVE SURG SYN 6.5 ES PF (GLOVE) ×3 IMPLANT
GLOVE SURG SYN 8.0 (GLOVE) ×6 IMPLANT
GOWN STRL REUS W/ TWL LRG LVL3 (GOWN DISPOSABLE) ×1 IMPLANT
GOWN STRL REUS W/ TWL XL LVL3 (GOWN DISPOSABLE) ×1 IMPLANT
GOWN STRL REUS W/TWL LRG LVL3 (GOWN DISPOSABLE) ×2
GOWN STRL REUS W/TWL XL LVL3 (GOWN DISPOSABLE) ×2
IV NS 500ML (IV SOLUTION) ×2
IV NS 500ML BAXH (IV SOLUTION) ×1 IMPLANT
KIT RM TURNOVER STRD PROC AR (KITS) ×3 IMPLANT
LABEL OR SOLS (LABEL) ×3 IMPLANT
LOOP RED MAXI  1X406MM (MISCELLANEOUS) ×6
LOOP VESSEL MAXI 1X406 RED (MISCELLANEOUS) ×3 IMPLANT
LOOP VESSEL MINI 0.8X406 BLUE (MISCELLANEOUS) ×2 IMPLANT
LOOPS BLUE MINI 0.8X406MM (MISCELLANEOUS) ×4
NEEDLE HYPO 18GX1.5 BLUNT FILL (NEEDLE) ×3 IMPLANT
NS IRRIG 1000ML POUR BTL (IV SOLUTION) ×3 IMPLANT
PACK BASIN MAJOR ARMC (MISCELLANEOUS) ×3 IMPLANT
PACK UNIVERSAL (MISCELLANEOUS) ×3 IMPLANT
STOCKINETTE M/LG 89821 (MISCELLANEOUS) ×3 IMPLANT
SUT MNCRL+ 5-0 UNDYED PC-3 (SUTURE) ×1 IMPLANT
SUT MONOCRYL 5-0 (SUTURE) ×2
SUT PROLENE 5 0 RB 1 DA (SUTURE) ×6 IMPLANT
SUT PROLENE 6 0 BV (SUTURE) ×9 IMPLANT
SUT SILK 2 0 (SUTURE) ×2
SUT SILK 2-0 18XBRD TIE 12 (SUTURE) ×1 IMPLANT
SUT SILK 3 0 (SUTURE) ×2
SUT SILK 3-0 18XBRD TIE 12 (SUTURE) ×1 IMPLANT
SUT SILK 4 0 (SUTURE) ×2
SUT SILK 4-0 18XBRD TIE 12 (SUTURE) ×1 IMPLANT
SUT VIC AB 2-0 CT1 27 (SUTURE) ×4
SUT VIC AB 2-0 CT1 TAPERPNT 27 (SUTURE) ×2 IMPLANT
SUT VIC AB 3-0 SH 27 (SUTURE) ×2
SUT VIC AB 3-0 SH 27X BRD (SUTURE) ×1 IMPLANT
SUT VICRYL+ 3-0 36IN CT-1 (SUTURE) ×6 IMPLANT
SYR 20CC LL (SYRINGE) ×3 IMPLANT
SYR 3ML LL SCALE MARK (SYRINGE) ×9 IMPLANT
SYR TB 1ML LUER SLIP (SYRINGE) ×3 IMPLANT
SYRINGE 10CC LL (SYRINGE) ×3 IMPLANT

## 2016-06-09 NOTE — ED Triage Notes (Signed)
Pt via ems from home with sharp leg pain in left leg. Pt states it began when he was on the toilet. He has brother who had similar episode and had blood clot. Pt reported 10/10 pain upon ems arrival; has decreased to 4/10 at this time. Pt on coumadin for afib hx.

## 2016-06-09 NOTE — ED Provider Notes (Signed)
[facility] Emergency Department Provider Note ____________________________________________   I have reviewed the triage vital signs and the triage nursing note.  HISTORY  Chief Complaint Leg Pain   Historian Patient and son and brother  HPI Luis Byrd is a 76 y.o. male presents with his son who he lives with, for an episode of left hamstring pain. Was relatively acute onset and was severe and left him lying on the ground holding his leg and crying in pain per the son. He states that during that time his foot felt sort of numb. Symptoms have eased off and are essentially resolved. No weakness or numbness. Mild soreness at the inner distal thigh and somewhat behind the knee on the left side. No swelling or skin rash.  States that one point he felt dizzy, but not sure if due to pain. No headache.Her speech. No arm weakness or numbness. No abdominal pain.    Past Medical History:  Diagnosis Date  . Arthritis   . Blind   . BPH (benign prostatic hyperplasia)   . CHF (congestive heart failure) (HCC)   . Chronic a-fib (HCC)   . Dysrhythmia    ATRIAL FIB  . HLD (hyperlipidemia)   . Hypertension     Patient Active Problem List   Diagnosis Date Noted  . Encounter for central line placement   . 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 Chronic diastolic congestive heart failure (HCC)   . 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 Chronic atrial fibrillation (HCC) 09/27/2015  . HTN (hypertension) 09/27/2015  . HLD (hyperlipidemia) 09/27/2015    Past Surgical History:  Procedure Laterality Date  . HERNIA REPAIR    . KNEE SURGERY      Prior to Admission medications   Medication Sig Start Date End Date Taking? Authorizing Provider  acetaminophen (TYLENOL) 500 MG tablet Take 1,000 mg by mouth every 6 (six) hours as needed.   Yes Historical Provider, MD  amLODipine (NORVASC) 10 MG tablet Take 10 mg by mouth 2 (two) times  daily. 07/17/15  Yes Historical Provider, MD  Ascorbic Acid (VITAMIN C) 1000 MG tablet Take 1,000 mg by mouth daily.   Yes Historical Provider, MD  cholecalciferol (VITAMIN D) 1000 units tablet Take 1,000 Units by mouth daily.   Yes Historical Provider, MD  DIGOX 125 MCG tablet Take 125 mcg by mouth daily. 07/17/15  Yes Historical Provider, MD  docusate sodium (COLACE) 50 MG capsule Take 150 mg by mouth at bedtime.    Yes Historical Provider, MD  escitalopram (LEXAPRO) 10 MG tablet Take 10 mg by mouth at bedtime.   Yes Historical Provider, MD  hydrochlorothiazide (HYDRODIURIL) 25 MG tablet Take 25 mg by mouth 2 (two) times daily.   Yes Historical Provider, MD  Multiple Vitamin (MULTIVITAMIN WITH MINERALS) TABS tablet Take 1 tablet by mouth daily.   Yes Historical Provider, MD  potassium chloride SA (K-DUR,KLOR-CON) 20 MEQ tablet Take 40 mEq by mouth 2 (two) times daily. 07/17/15  Yes Historical Provider, MD  prednisoLONE sodium phosphate (INFLAMASE FORTE) 1 % ophthalmic solution Place 1 drop into both eyes 2 (two) times daily.   Yes Historical Provider, MD  predniSONE (DELTASONE) 10 MG tablet Please take 3 tablets for 1 day, then 2 tablets for 2 days, then continue 10 mg oral daily thereafter Patient taking differently: Take 10 mg by mouth daily. Please take 3 tablets for 1 day, then 2 tablets for 2 days, then continue 10 mg  oral daily thereafter 10/15/15  Yes Starleen Arms, MD  warfarin (COUMADIN) 5 MG tablet Take 5-7.5 mg by mouth at bedtime. Alternates between 5 mg and 7.5 mg. Had 5 mg dose Tuesday the 16th and is due for 7.5 mg dose Wednesday the 17th   Yes Historical Provider, MD  cyclobenzaprine (FLEXERIL) 10 MG tablet Take 1 tablet (10 mg total) by mouth 3 (three) times daily as needed for muscle spasms. 06/09/16   Governor Rooks, MD  Glucosamine-Chondroitin 250-200 MG TABS Take 2 tablets by mouth daily.    Historical Provider, MD  pantoprazole (PROTONIX) 40 MG tablet Take 1 tablet (40 mg total)  by mouth at bedtime. 10/15/15   Leana Roe Elgergawy, MD  senna-docusate (SENOKOT-S) 8.6-50 MG tablet Take 1 tablet by mouth at bedtime as needed for mild constipation. 10/15/15   Leana Roe Elgergawy, MD  warfarin (COUMADIN) 2 MG tablet Take 3 tablets (6 mg total) by mouth daily. Take three of the 2mg  tablet 10/16/15   Starleen Arms, MD  Zinc 50 MG TABS Take 50 mg by mouth every morning.    Historical Provider, MD    No Known Allergies  Family History  Problem Relation Age of Onset  . Heart attack Father   . Cancer Mother     Social History Social History  Substance Use Topics  . Smoking status: Former Smoker    Quit date: 05/24/1980  . Smokeless tobacco: Never Used  . Alcohol use No    Review of Systems  Constitutional: Negative for fever. Eyes: Negative for visual changes. ENT: Negative for sore throat. Cardiovascular: Negative for chest pain. Respiratory: Negative for shortness of breath. Gastrointestinal: Negative for abdominal pain, vomiting and diarrhea. Genitourinary: Negative for dysuria. Musculoskeletal: Negative for back pain. Skin: Negative for rash. Neurological: Negative for headache. 10 point Review of Systems otherwise negative ____________________________________________   PHYSICAL EXAM:  VITAL SIGNS: ED Triage Vitals  Enc Vitals Group     BP 06/09/16 1116 138/73     Pulse Rate 06/09/16 1116 (!) 103     Resp 06/09/16 1116 20     Temp 06/09/16 1116 97.9 F (36.6 C)     Temp Source 06/09/16 1116 Oral     SpO2 06/09/16 1116 100 %     Weight 06/09/16 1117 170 lb (77.1 kg)     Height 06/09/16 1117 5\' 9"  (1.753 m)     Head Circumference --      Peak Flow --      Pain Score 06/09/16 1117 4     Pain Loc --      Pain Edu? --      Excl. in GC? --      Constitutional: Alert and oriented. Well appearing and in no distress. HEENT   Head: Normocephalic and atraumatic.      Eyes: Conjunctivae are normal. PERRL. Normal extraocular movements.       Ears:         Nose: No congestion/rhinnorhea.   Mouth/Throat: Mucous membranes are moist.   Neck: No stridor. Cardiovascular/Chest: Normal rate, regular rhythm.  No murmurs, rubs, or gallops. Respiratory: Normal respiratory effort without tachypnea nor retractions. Breath sounds are clear and equal bilaterally. No wheezes/rales/rhonchi. Gastrointestinal: Soft. No distention, no guarding, no rebound. Nontender.    Genitourinary/rectal:Deferred Musculoskeletal: Mild tenderness to the posterior hamstring medially and distally on the left leg. No calf tenderness or calf pain. No lower extremity edema. No skin rash. No mass. Neurologic:  Normal speech and language.  No gross or focal neurologic deficits are appreciated. Skin:  Skin is warm, dry and intact. No rash noted. Psychiatric: Mood and affect are normal. Speech and behavior are normal. Patient exhibits appropriate insight and judgment.   ____________________________________________  LABS (pertinent positives/negatives)  Labs Reviewed  BASIC METABOLIC PANEL - Abnormal; Notable for the following:       Result Value   Potassium 3.1 (*)    Glucose, Bld 169 (*)    Creatinine, Ser 1.31 (*)    GFR calc non Af Amer 51 (*)    GFR calc Af Amer 59 (*)    All other components within normal limits  FIBRIN DERIVATIVES D-DIMER ([facility] ONLY) - Abnormal; Notable for the following:    Fibrin derivatives D-dimer ([facility]) 991 (*)    All other components within normal limits  PROTIME-INR - Abnormal; Notable for the following:    Prothrombin Time 19.1 (*)    All other components within normal limits  APTT  CBC    ____________________________________________    EKG I, Governor Rooksebecca Alexi Geibel, MD, the attending physician have personally viewed and interpreted all ECGs.  96 bpm. Normal sinus rhythm. Narrow QRS. Normal axis. Frequent PVCs. Nonspecific ST and T-wave ____________________________________________  RADIOLOGY All Xrays were viewed by  me. Imaging interpreted by Radiologist.  Lower extremity ultrasound left: IMPRESSION: No evidence of deep venous thrombosis. __________________________________________  PROCEDURES  Procedure(s) performed: None  Critical Care performed: None  ____________________________________________   ED COURSE / ASSESSMENT AND PLAN  Pertinent labs & imaging results that were available during my care of the patient were reviewed by me and considered in my medical decision making (see chart for details).  Luis Byrd is here for an episode of left leg pain which is localized to his hamstring or posterior knee, which is all but essentially resolved at this point in time. It certainly sounds to me clinically like a muscle spasm which is now relieved. He did describe numbness at one point time, but I think this may be related to the muscle spasm, knee does not seem to have had symptoms of stroke or CVA or TIA.  He does have a history of taking potassium supplement once due to history of hypokalemia and I will send laboratory studies for that.  Additional lab tests were sent by nursing protocol. Ultimately I am going to obtain an ultrasound of the left lower extremity to rule out DVT although suspicion is kind of low.  Ultrasound negative. I was preparing for the patient is discharged home in [place] seen on 220 he complained that his foot for the last few minutes was feeling numb and tingly and cold.  In comparison with the other foot it does look more pale, and is complaining of "asleep" sensation and pain from his ankle including his entire left foot. Not sure I can actually palpate a pulse, and unable to Doppler pulse. I spoke with on-call vascular surgeon Dr. Marcha DuttonSchneider, who comes to the patient in ED. I'm placing order for heparin bolus and drip. Patient is subtherapeutic on his INR.  CONSULTATIONS:   Vascular Surgery, Dr. Lorretta HarpSchneir.   Patient / Family / Caregiver informed of clinical course,  medical decision-making process, and agree with plan.     ___________________________________________   FINAL CLINICAL IMPRESSION(S) / ED DIAGNOSES   Final diagnoses:  Muscle spasm  Left leg pain  Artery occlusion (HCC)              Note: This dictation was prepared with Dragon dictation.  Any transcriptional errors that result from this process are unintentional    Governor Rooks, MD 06/09/16 1510

## 2016-06-09 NOTE — Progress Notes (Signed)
ANTICOAGULATION CONSULT NOTE - Initial Consult  Pharmacy Consult for Heparin Drip Indication: chest pain/ACS  No Known Allergies  Patient Measurements: Height: 5\' 9"  (175.3 cm) Weight: 170 lb (77.1 kg) IBW/kg (Calculated) : 70.7  Vital Signs: Temp: 97.5 F (36.4 C) (01/17 1757) Temp Source: Oral (01/17 1116) BP: 136/79 (01/17 1827) Pulse Rate: 67 (01/17 1827)  Labs:  Recent Labs  06/09/16 1218  HGB 15.6  HCT 44.4  PLT 191  APTT 27  LABPROT 19.1*  INR 1.59  CREATININE 1.31*    Estimated Creatinine Clearance: 47.2 mL/min (by C-G formula based on SCr of 1.31 mg/dL (H)).   Medical History: Past Medical History:  Diagnosis Date  . Arthritis   . Blind   . BPH (benign prostatic hyperplasia)   . CHF (congestive heart failure) (HCC)   . Chronic a-fib (HCC)   . Dysrhythmia    ATRIAL FIB  . HLD (hyperlipidemia)   . Hypertension     Assessment: 76 yo male with arterial occlusion. Pharmacy consulted for Heparin dosing and monitoring.   Goal of Therapy:  Heparin level 0.3-0.7 units/ml Monitor platelets by anticoagulation protocol: Yes   Plan:  Per Dr. Gilda CreaseSchnier, will restart heparin drip at 1250units/hr at 2200 tonight. Heparin level ordered for 8 hours after drip begins. Pharmacy to continue to monitor and adjust dose per consult.   Gardner CandleSheema M Danity Schmelzer, PharmD, BCPS Clinical Pharmacist 06/09/2016 6:32 PM

## 2016-06-09 NOTE — Anesthesia Preprocedure Evaluation (Signed)
Anesthesia Evaluation  Patient identified by MRN, date of birth, ID band Patient awake    Reviewed: Allergy & Precautions, H&P , NPO status , Patient's Chart, lab work & pertinent test results, reviewed documented beta blocker date and time   History of Anesthesia Complications Negative for: history of anesthetic complications  Airway Mallampati: I  TM Distance: >3 FB Neck ROM: full    Dental  (+) Edentulous Upper, Edentulous Lower   Pulmonary neg shortness of breath, neg sleep apnea, neg COPD, neg recent URI, former smoker,    Pulmonary exam normal breath sounds clear to auscultation       Cardiovascular Exercise Tolerance: Good hypertension, (-) angina+ Peripheral Vascular Disease and +CHF  (-) CAD, (-) Past MI, (-) Cardiac Stents and (-) CABG negative cardio ROS Normal cardiovascular exam+ dysrhythmias (-) Valvular Problems/Murmurs Rhythm:regular Rate:Normal     Neuro/Psych negative neurological ROS  negative psych ROS   GI/Hepatic negative GI ROS, Neg liver ROS,   Endo/Other  negative endocrine ROS  Renal/GU negative Renal ROS  negative genitourinary   Musculoskeletal   Abdominal   Peds  Hematology negative hematology ROS (+)   Anesthesia Other Findings Past Medical History: No date: Arthritis No date: Blind No date: BPH (benign prostatic hyperplasia) No date: CHF (congestive heart failure) (HCC) No date: Chronic a-fib (HCC) No date: Dysrhythmia     Comment: ATRIAL FIB No date: HLD (hyperlipidemia) No date: Hypertension   Reproductive/Obstetrics negative OB ROS                             Anesthesia Physical Anesthesia Plan  ASA: III and emergent  Anesthesia Plan: General   Post-op Pain Management:    Induction:   Airway Management Planned:   Additional Equipment:   Intra-op Plan:   Post-operative Plan:   Informed Consent: I have reviewed the patients History  and Physical, chart, labs and discussed the procedure including the risks, benefits and alternatives for the proposed anesthesia with the patient or authorized representative who has indicated his/her understanding and acceptance.   Dental Advisory Given  Plan Discussed with: Anesthesiologist, CRNA and Surgeon  Anesthesia Plan Comments:         Anesthesia Quick Evaluation

## 2016-06-09 NOTE — ED Notes (Signed)
Pt to surgery.

## 2016-06-09 NOTE — Transfer of Care (Signed)
Immediate Anesthesia Transfer of Care Note  Patient: Luis Byrd  Procedure(s) Performed: Procedure(s): EMBOLECTOMY (Left)  Patient Location: PACU  Anesthesia Type:General  Level of Consciousness: sedated  Airway & Oxygen Therapy: Patient Spontanous Breathing and Patient connected to face mask oxygen  Post-op Assessment: Report given to RN and Post -op Vital signs reviewed and stable  Post vital signs: Reviewed  Last Vitals:  Vitals:   06/09/16 1330 06/09/16 1757  BP: 130/69 (!) 104/59  Pulse: 78 90  Resp: 15 14  Temp:  36.4 C    Last Pain:  Vitals:   06/09/16 1601  TempSrc:   PainSc: 6          Complications: No apparent anesthesia complications

## 2016-06-09 NOTE — Anesthesia Postprocedure Evaluation (Signed)
Anesthesia Post Note  Patient: Luis Byrd  Procedure(s) Performed: Procedure(s) (LRB): EMBOLECTOMY (Left)  Patient location during evaluation: PACU Anesthesia Type: General Level of consciousness: awake and alert Pain management: pain level controlled Vital Signs Assessment: post-procedure vital signs reviewed and stable Respiratory status: spontaneous breathing, nonlabored ventilation, respiratory function stable and patient connected to nasal cannula oxygen Cardiovascular status: blood pressure returned to baseline and stable Postop Assessment: no signs of nausea or vomiting Anesthetic complications: no     Last Vitals:  Vitals:   06/09/16 1912 06/09/16 1959  BP: (!) 144/75 140/74  Pulse: 77 74  Resp: 14 15  Temp: 36.7 C 36.7 C    Last Pain:  Vitals:   06/09/16 1912  TempSrc:   PainSc: 3                  Lenard SimmerAndrew Rotunda Worden

## 2016-06-09 NOTE — Progress Notes (Signed)
ANTICOAGULATION CONSULT NOTE - Initial Consult  Pharmacy Consult for Heparin Drip Indication: chest pain/ACS  No Known Allergies  Patient Measurements: Height: 5\' 9"  (175.3 cm) Weight: 170 lb (77.1 kg) IBW/kg (Calculated) : 70.7  Vital Signs: Temp: 97.9 F (36.6 C) (01/17 1116) Temp Source: Oral (01/17 1116) BP: 138/73 (01/17 1116) Pulse Rate: 103 (01/17 1116)  Labs:  Recent Labs  06/09/16 1218  HGB 15.6  HCT 44.4  PLT 191  APTT 27  LABPROT 19.1*  INR 1.59  CREATININE 1.31*    Estimated Creatinine Clearance: 47.2 mL/min (by C-G formula based on SCr of 1.31 mg/dL (H)).   Medical History: Past Medical History:  Diagnosis Date  . Arthritis   . Blind   . BPH (benign prostatic hyperplasia)   . CHF (congestive heart failure) (HCC)   . Chronic a-fib (HCC)   . Dysrhythmia    ATRIAL FIB  . HLD (hyperlipidemia)   . Hypertension     Assessment: 76 yo male with arterial occlusion. Pharmacy consulted for Heparin dosing and monitoring.   Goal of Therapy:  Heparin level 0.3-0.7 units/ml Monitor platelets by anticoagulation protocol: Yes   Plan:  Baseline labs ordered.  Give 4000 units bolus x 1 Start heparin infusion at 1250 units/hr Check anti-Xa level in 8 hours and daily while on heparin Continue to monitor H&H and platelets  Gardner CandleSheema M Meriel Kelliher, PharmD, BCPS Clinical Pharmacist 06/09/2016 3:15 PM

## 2016-06-09 NOTE — Anesthesia Post-op Follow-up Note (Cosign Needed)
Anesthesia QCDR form completed.        

## 2016-06-09 NOTE — Anesthesia Procedure Notes (Signed)
Procedure Name: LMA Insertion Performed by: Margarethe Virgen Pre-anesthesia Checklist: Patient identified, Patient being monitored, Timeout performed, Emergency Drugs available and Suction available Patient Re-evaluated:Patient Re-evaluated prior to inductionOxygen Delivery Method: Circle system utilized Preoxygenation: Pre-oxygenation with 100% oxygen Intubation Type: IV induction Ventilation: Mask ventilation without difficulty LMA: LMA inserted LMA Size: 4.5 Tube type: Oral Number of attempts: 1 Placement Confirmation: positive ETCO2 and breath sounds checked- equal and bilateral Tube secured with: Tape Dental Injury: Teeth and Oropharynx as per pre-operative assessment      

## 2016-06-09 NOTE — Op Note (Signed)
    OPERATIVE NOTE   PROCEDURE: Open embolectomy of the left common femoral superficial femoral and profunda femoris arteries  PRE-OPERATIVE DIAGNOSIS: Embolus of the left leg with ischemia of the left foot  POST-OPERATIVE DIAGNOSIS: Same  SURGEON: Luis Byrd  ASSISTANT(S): None  ANESTHESIA: general  ESTIMATED BLOOD LOSS: 75 cc  FINDING(S): Thrombus within the common femoral superficial femoral and popliteal as well as the deep femoral arteries of the left leg  SPECIMEN(S):  Thromboembolus to pathology  INDICATIONS:   Luis Byrd is a 76 y.o. male who presents with painful ischemia of the left leg.  DESCRIPTION: After full informed written consent was obtained from the patient, the patient was brought back to the operating room and placed supine upon the operating table.  Prior to induction, the patient received IV antibiotics.   After obtaining adequate anesthesia, the patient was then prepped and draped in the standard fashion for a  left leg embolectomy femoral approach.  The left leg was extended, linear incision was created overlying the common femoral impulse. The dissection was then carried down through the soft tissues to expose the common femoral artery. The dissection was then carried distally exposing the origins of the SFA and profunda femoris and these were looped individually with Silastic vessel loops.   3000  Units of heparin was given and allowed to circulate.  Transverse arteriotomy was then made with an 11 blade scalpel. A #4 Fogarty catheter was then passed proximally. Multiple passes were made until no further thrombotic material was returned. Forward bleeding was excellent. The artery was then irrigated with heparinized saline. Attention was then turned to the distal aspect where again several passes were made with the Fogarty individually down the deep femoral and then the superficial femoral. 2 passes were made in the deep femoral with the second pass  not retrieving any further thrombotic material although the first past retrieved a significant amount excellent backbleeding was noted and the profunda was flushed with heparinized saline. Attention was then turned to the superficial femoral where 4 passes were made the catheter was passed all the way to 70 cm which were approximated over his leg would be into the peroneal typically. The first 3 passes returned a significant amount of thrombus no thrombus was returned on the fourth and excellent backbleeding was noted. The SFA was then flushed with heparinized saline and reclamped. This ensured no further embolic material was present and then the artery was irrigated with heparinized saline as noted.  The common femoral artery on the left was then closed using interrupted 6-0 Prolene.  Flushing maneuvers were performed and flow was reestablished distally. The wound was then inspected and additional 6-0 Prolene sutures were utilized as needed. The wound was then irrigated Surgicel was placed in the bed of the wound over the suture line and the wound was reapproximated with interrupted 3-0 Vicryl for the subcutaneous layer and 4-0 Monocryl was used to close the skin in a subcuticular.  The patient tolerated this procedure well. At the conclusion of the procedure the patient's foot was now pink and there was a 2+ palpable posterior tibial pulse on the left.  COMPLICATIONS: None  CONDITION: Luis Byrd   Luis Byrd  Office: [phone]   06/09/2016, 6:20 PM

## 2016-06-09 NOTE — H&P (Signed)
[place] [facility] Admission History & Physical  MRN : [id number]  Luis Byrd is a 76 y.o. (Dec 07, 1940) male who presents with chief complaint of  Chief Complaint  Patient presents with  . Leg Pain  .  History of Present Illness: I am asked to evaluate the patient by Dr. Shaune Pollack. He is a 76 y.o. male presents to [facility] regional ER with left leg pain. The pain was acute onset and was severe and left him "lying on the ground holding his leg and crying in pain". He states that during that time his foot felt numb. Symptoms seem to ease off then his symptoms abruptly worsened to the point where using continuous 10 out of 10 pain.   No swelling or skin rash.    He denies palpitations but states that one point he felt dizzy, but not sure if due to pain. No headache.Her speech. No arm weakness or numbness. No abdominal pain.  Current Facility-Administered Medications  Medication Dose Route Frequency Provider Last Rate Last Dose  . [START ON 06/10/2016] ceFAZolin (ANCEF) IVPB 2g/100 mL premix  2 g Intravenous On Call to OR Renford Dills, MD      . heparin bolus via infusion 4,000 Units  4,000 Units Intravenous Once Sheema M Hallaji, RPH       Followed by  . heparin ADULT infusion 100 units/mL (25000 units/254mL sodium chloride 0.45%)  1,250 Units/hr Intravenous Continuous Sheema M Hallaji, [facility]       Current Outpatient Prescriptions  Medication Sig Dispense Refill  . acetaminophen (TYLENOL) 500 MG tablet Take 1,000 mg by mouth every 6 (six) hours as needed.    Marland Kitchen amLODipine (NORVASC) 10 MG tablet Take 10 mg by mouth 2 (two) times daily.    . Ascorbic Acid (VITAMIN C) 1000 MG tablet Take 1,000 mg by mouth daily.    . cholecalciferol (VITAMIN D) 1000 units tablet Take 1,000 Units by mouth daily.    Marland Kitchen DIGOX 125 MCG tablet Take 125 mcg by mouth daily.    Marland Kitchen docusate sodium (COLACE) 50 MG capsule Take 150 mg by mouth at bedtime.     Marland Kitchen escitalopram (LEXAPRO) 10 MG tablet Take  10 mg by mouth at bedtime.    . hydrochlorothiazide (HYDRODIURIL) 25 MG tablet Take 25 mg by mouth 2 (two) times daily.    . Multiple Vitamin (MULTIVITAMIN WITH MINERALS) TABS tablet Take 1 tablet by mouth daily.    . potassium chloride SA (K-DUR,KLOR-CON) 20 MEQ tablet Take 40 mEq by mouth 2 (two) times daily.    . prednisoLONE sodium phosphate (INFLAMASE FORTE) 1 % ophthalmic solution Place 1 drop into both eyes 2 (two) times daily.    . predniSONE (DELTASONE) 10 MG tablet Please take 3 tablets for 1 day, then 2 tablets for 2 days, then continue 10 mg oral daily thereafter (Patient taking differently: Take 10 mg by mouth daily. Please take 3 tablets for 1 day, then 2 tablets for 2 days, then continue 10 mg oral daily thereafter)    . warfarin (COUMADIN) 5 MG tablet Take 5-7.5 mg by mouth at bedtime. Alternates between 5 mg and 7.5 mg. Had 5 mg dose Tuesday the 16th and is due for 7.5 mg dose Wednesday the 17th    . cyclobenzaprine (FLEXERIL) 10 MG tablet Take 1 tablet (10 mg total) by mouth 3 (three) times daily as needed for muscle spasms. 21 tablet 0  . Glucosamine-Chondroitin 250-200 MG TABS Take 2 tablets by mouth daily.    Marland Kitchen  pantoprazole (PROTONIX) 40 MG tablet Take 1 tablet (40 mg total) by mouth at bedtime. 30 tablet 0  . senna-docusate (SENOKOT-S) 8.6-50 MG tablet Take 1 tablet by mouth at bedtime as needed for mild constipation.    Marland Kitchen. warfarin (COUMADIN) 2 MG tablet Take 3 tablets (6 mg total) by mouth daily. Take three of the 2mg  tablet    . Zinc 50 MG TABS Take 50 mg by mouth every morning.      Past Medical History:  Diagnosis Date  . Arthritis   . Blind   . BPH (benign prostatic hyperplasia)   . CHF (congestive heart failure) (HCC)   . Chronic a-fib (HCC)   . Dysrhythmia    ATRIAL FIB  . HLD (hyperlipidemia)   . Hypertension     Past Surgical History:  Procedure Laterality Date  . HERNIA REPAIR    . KNEE SURGERY      Social History Social History  Substance Use  Topics  . Smoking status: Former Smoker    Quit date: 05/24/1980  . Smokeless tobacco: Never Used  . Alcohol use No    Family History Family History  Problem Relation Age of Onset  . Heart attack Father   . Cancer Mother   No family history of bleeding clotting disorders porphyria or autoimmune disease   No Known Allergies   REVIEW OF SYSTEMS (Negative unless checked)  Constitutional: [] Weight loss  [] Fever  [] Chills Cardiac: [] Chest pain   [] Chest pressure   [] Palpitations   [] Shortness of breath when laying flat   [] Shortness of breath at rest   [] Shortness of breath with exertion. Vascular:  [] Pain in legs with walking   [] Pain in legs at rest   [] Pain in legs when laying flat   [] Claudication   [] Pain in feet when walking  [] Pain in feet at rest  [] Pain in feet when laying flat   [] History of DVT   [] Phlebitis   [] Swelling in legs   [] Varicose veins   [] Non-healing ulcers Pulmonary:   [] Uses home oxygen   [] Productive cough   [] Hemoptysis   [] Wheeze  [] COPD   [] Asthma Neurologic:  [] Dizziness  [] Blackouts   [] Seizures   [] History of stroke   [] History of TIA  [] Aphasia   [] Temporary blindness   [] Dysphagia   [] Weakness or numbness in arms   [] Weakness or numbness in legs Musculoskeletal:  [] Arthritis   [] Joint swelling   [] Joint pain   [] Low back pain Hematologic:  [] Easy bruising  [] Easy bleeding   [] Hypercoagulable state   [] Anemic  [] Hepatitis Gastrointestinal:  [] Blood in stool   [] Vomiting blood  [] Gastroesophageal reflux/heartburn   [] Difficulty swallowing. Genitourinary:  [] Chronic kidney disease   [] Difficult urination  [] Frequent urination  [] Burning with urination   [] Blood in urine Skin:  [] Rashes   [] Ulcers   [] Wounds Psychological:  [] History of anxiety   []  History of major depression.  Physical Examination  Vitals:   06/09/16 1116 06/09/16 1117  BP: 138/73   Pulse: (!) 103   Resp: 20   Temp: 97.9 F (36.6 C)   TempSrc: Oral   SpO2: 100%   Weight:  77.1 kg  (170 lb)  Height:  5\' 9"  (1.753 m)   Body mass index is 25.1 kg/m. Gen: WD/WN, NAD Head: [place]/AT, No temporalis wasting. Prominent temp pulse not noted. Ear/Nose/Throat: Hearing grossly intact, nares w/o erythema or drainage, oropharynx w/o Erythema/Exudate,  Eyes: Conjunctiva clear, sclera non-icteric Neck: Trachea midline.  No JVD.  Pulmonary:  Good  air movement, respirations not labored, no use of accessory muscles.  Cardiac: RRR, normal S1, S2. Vascular: Left leg is cold from the knee down is pale and there is no capillary refill. He is insensate across the forefoot. He is having moderate motor weakness of the ankle and definite motor weakness of the toes  Vessel Right Left  Radial Palpable Palpable  Ulnar Palpable Palpable  Brachial Palpable Palpable  Carotid Palpable, without bruit Palpable, without bruit  Aorta Not palpable N/A  Femoral Palpable Palpable  Popliteal Palpable Not Palpable  PT Palpable Not Palpable  DP Palpable Not Palpable   Gastrointestinal: soft, non-tender/non-distended. No guarding/reflex.  Musculoskeletal: M/S 5/5 throughout.  Extremities without ischemic changes.  No deformity or atrophy.  Neurologic: Sensation grossly intact in extremities.  Symmetrical.  Speech is fluent. Motor exam as listed above. Psychiatric: Judgment intact, Mood & affect appropriate for pt's clinical situation. Dermatologic: No rashes or ulcers noted.  No cellulitis or open wounds. Lymph : No Cervical, Axillary, or Inguinal lymphadenopathy.     CBC Lab Results  Component Value Date   WBC 7.0 06/09/2016   HGB 15.6 06/09/2016   HCT 44.4 06/09/2016   MCV 86.1 06/09/2016   PLT 191 06/09/2016    BMET    Component Value Date/Time   NA 142 06/09/2016 1218   NA 139 01/18/2013 0609   K 3.1 (L) 06/09/2016 1218   K 3.2 (L) 01/18/2013 0609   CL 105 06/09/2016 1218   CL 100 01/18/2013 0609   CO2 25 06/09/2016 1218   CO2 34 (H) 01/18/2013 0609   GLUCOSE 169 (H) 06/09/2016 1218    GLUCOSE 104 (H) 01/18/2013 0609   BUN 20 06/09/2016 1218   BUN 24 (H) 01/18/2013 0609   CREATININE 1.31 (H) 06/09/2016 1218   CREATININE 1.30 01/18/2013 0609   CALCIUM 9.8 06/09/2016 1218   CALCIUM 8.8 01/18/2013 0609   GFRNONAA 51 (L) 06/09/2016 1218   GFRNONAA 54 (L) 01/18/2013 0609   GFRAA 59 (L) 06/09/2016 1218   GFRAA >60 01/18/2013 0609   Estimated Creatinine Clearance: 47.2 mL/min (by C-G formula based on SCr of 1.31 mg/dL (H)).  COAG Lab Results  Component Value Date   INR 1.59 06/09/2016   INR 1.07 12/03/2015   INR 3.55 (H) 10/15/2015    Radiology US Venous Img Lower Unilateral Left  Result Date: 06/09/2016 CLINICAL DATA:  Acute left leg pain for 7 hours EXAM: LEFT LOWER EXTREMITY VENOUS DOPPLER ULTRASOUND TECHNIQUE: Gray-scale sonography with graded compression, as well as color Doppler and duplex ultrasound were performed to evaluate the lower extremity deep venous systems from the level of the common femoral vein and including the common femoral, femoral, profunda femoral, popliteal and calf veins including the posterior tibial, peroneal and gastrocnemius veins when visible. The superficial great saphenous vein was also interrogated. Spectral Doppler was utilized to evaluate flow at rest and with distal augmentation maneuvers in the common femoral, femoral and popliteal veins. COMPARISON:  None. FINDINGS: Contralateral Common Femoral Vein: Respiratory phasicity is normal and symmetric with the symptomatic side. No evidence of thrombus. Normal compressibility. Common Femoral Vein: No evidence of thrombus. Normal compressibility, respiratory phasicity and response to augmentation. Saphenofemoral Junction: No evidence of thrombus. Normal compressibility and flow on color Doppler imaging. Profunda Femoral Vein: No evidence of thrombus. Normal compressibility and flow on color Doppler imaging. Femoral Vein: No evidence of thrombus. Normal compressibility, respiratory phasicity and  response to augmentation. Popliteal Vein: No evidence of thrombus. Normal compressibility, respiratory phasicity  and response to augmentation. Calf Veins: No evidence of thrombus. Normal compressibility and flow on color Doppler imaging. Superficial Great Saphenous Vein: No evidence of thrombus. Normal compressibility and flow on color Doppler imaging. Venous Reflux:  None. Other Findings:  None. IMPRESSION: No evidence of deep venous thrombosis. Electronically Signed   By: Judie Petit.  Shick M.D.   On: 06/09/2016 14:18    Assessment/Plan 1. Ischemic left leg:   This is secondary to atrial fibrillation and acute embolization from cardiac source. He will require emergent surgery for limb salvage. The risks and benefits of been reviewed all questions been answered he and his son are in agreement with proceeding. He was started on a heparin drip. Of note he was subtherapeutic on his Coumadin upon admission.  2. Atrial fibrillation: Patient is a history of multiple time points where he is subtherapeutic on his Coumadin. He has talked to his medical doctor about other medications but the change was never made. At this point given these findings I will plan to discharge him on Eliquis.  Continue his antiarrhythmics as ordered  3. Hypertension:  Patient will be continued on his amlodipine postoperatively 4.  Hyperlipidemia:  Patient will be continued on his statin postoperatively 5.  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 He will be given appropriate controls and education.  Levora Dredge, MD  06/09/2016 3:38 PM

## 2016-06-10 ENCOUNTER — Encounter: Payer: Self-pay | Admitting: Vascular Surgery

## 2016-06-10 LAB — CBC
HEMATOCRIT: 36.9 % — AB (ref 40.0–52.0)
Hemoglobin: 12.9 g/dL — ABNORMAL LOW (ref 13.0–18.0)
MCH: 30.4 pg (ref 26.0–34.0)
MCHC: 34.9 g/dL (ref 32.0–36.0)
MCV: 87.2 fL (ref 80.0–100.0)
PLATELETS: 162 10*3/uL (ref 150–440)
RBC: 4.23 MIL/uL — ABNORMAL LOW (ref 4.40–5.90)
RDW: 15.2 % — AB (ref 11.5–14.5)
WBC: 6.9 10*3/uL (ref 3.8–10.6)

## 2016-06-10 LAB — BASIC METABOLIC PANEL
Anion gap: 8 (ref 5–15)
BUN: 19 mg/dL (ref 6–20)
CO2: 29 mmol/L (ref 22–32)
CREATININE: 1.15 mg/dL (ref 0.61–1.24)
Calcium: 8.6 mg/dL — ABNORMAL LOW (ref 8.9–10.3)
Chloride: 106 mmol/L (ref 101–111)
GFR calc Af Amer: >60 mL/min (ref >60)
GFR, EST NON AFRICAN AMERICAN: 60 mL/min — AB (ref >60)
Glucose, Bld: 94 mg/dL (ref 65–99)
POTASSIUM: 2.9 mmol/L — AB (ref 3.5–5.1)
SODIUM: 143 mmol/L (ref 135–145)

## 2016-06-10 LAB — HEPARIN LEVEL (UNFRACTIONATED): HEPARIN UNFRACTIONATED: 0.47 [IU]/mL (ref 0.30–0.70)

## 2016-06-10 MED ORDER — PREDNISOLONE ACETATE 1 % OP SUSP
1.0000 [drp] | Freq: Two times a day (BID) | OPHTHALMIC | Status: DC
  Administered 2016-06-10 – 2016-06-11 (×3): 1 [drp] via OPHTHALMIC
  Filled 2016-06-10: qty 1

## 2016-06-10 MED ORDER — PNEUMOCOCCAL VAC POLYVALENT 25 MCG/0.5ML IJ INJ: 0.5000 mL | INJECTION | INTRAMUSCULAR | Status: DC

## 2016-06-10 MED ORDER — APIXABAN 5 MG PO TABS
5.0000 mg | ORAL_TABLET | Freq: Two times a day (BID) | ORAL | Status: DC
  Administered 2016-06-10 – 2016-06-11 (×3): 5 mg via ORAL
  Filled 2016-06-10 (×3): qty 1

## 2016-06-10 NOTE — Progress Notes (Signed)
Patient appropriately answering cognition questions, however reports that he is seeing things. Per pt " he is currently in a [place] with tombstone floating around his head". Also reporting that he is seeing "cats on the wall". Will continue to monitor.    Mayra NeerNesbitt, Kebra Lowrimore M

## 2016-06-10 NOTE — Progress Notes (Signed)
Patient and Son reports that patient is a DNR and has the form at home and request that the hospital keep him a DNR. Call on call provider Cleda DaubKimberly Stegmayer PA gave new order for DNR.

## 2016-06-10 NOTE — Progress Notes (Signed)
[facility]  Daily Progress Note   Subjective: 1 Day Post-Op: open embolectomy of the left common femoral superficial femoral and profunda femoris arteries for embolus of the left lower extremity with ischemia of the left foot.  Patient without complaint this afternoon. States he feels much better. His pain has improved greatly.   Objective: Vitals:   06/10/16 0029 06/10/16 0358 06/10/16 0815 06/10/16 1230  BP: (!) 114/56 124/66 115/77 (!) 128/55  Pulse: 70 82 79 74  Resp:  16 18 20   Temp:  98.5 F (36.9 C) 98.5 F (36.9 C) 98.3 F (36.8 C)  TempSrc:  Oral Oral Oral  SpO2: 97% 95% 96% 97%  Weight:      Height:        Intake/Output Summary (Last 24 hours) at 06/10/16 1500 Last data filed at 06/10/16 1451  Gross per 24 hour  Intake             1180 ml  Output               75 ml  Net             1105 ml   Physical Exam: A&Ox3, NAD CV: Irregularily Irregular Pulmonary: CTA Bilaterally Abdomen: Soft, Nontender, Nondistended Left Groin: OR dressing place. No drainage or swelling noted. Vascular:  Left Extremity: Thigh soft, calf soft, foot warm. Faint pedal pulses present.    Laboratory: CBC    Component Value Date/Time   WBC 6.9 06/10/2016 0615   HGB 12.9 (L) 06/10/2016 0615   HGB 12.5 (L) 01/16/2013 0544   HCT 36.9 (L) 06/10/2016 0615   HCT 39.6 (L) 01/02/2013 1014   PLT 162 06/10/2016 0615   PLT 165 01/16/2013 0544    BMET    Component Value Date/Time   NA 143 06/10/2016 0615   NA 139 01/18/2013 0609   K 2.9 (L) 06/10/2016 0615   K 3.2 (L) 01/18/2013 0609   CL 106 06/10/2016 0615   CL 100 01/18/2013 0609   CO2 29 06/10/2016 0615   CO2 34 (H) 01/18/2013 0609   GLUCOSE 94 06/10/2016 0615   GLUCOSE 104 (H) 01/18/2013 0609   BUN 19 06/10/2016 0615   BUN 24 (H) 01/18/2013 0609   CREATININE 1.15 06/10/2016 0615   CREATININE 1.30 01/18/2013 0609   CALCIUM 8.6 (L) 06/10/2016 0615   CALCIUM 8.8 01/18/2013 0609   GFRNONAA 60 (L)  06/10/2016 0615   GFRNONAA 54 (L) 01/18/2013 0609   GFRAA >60 06/10/2016 0615   GFRAA >60 01/18/2013 0609   Assessment/Planning: The patient is a 76 year old male with a past medical history of HTN, Afib and CHF who is 1 Day Post-Op: open embolectomy of the left common femoral superficial femoral and profunda femoris arteries for embolus of the left lower extremity with ischemia of the left foot - stable 1) Will ambulate today and order PT 2) Continue pain control. 3) Stopped heparin. Now on Eliquis and Plavix. 4) Most like d/c home tomorrow.   Cleda DaubKimberly Micco Bourbeau PA-C 06/10/2016 3:00 PM

## 2016-06-10 NOTE — Consult Note (Signed)
Reason for Consult :AFIB thrombus post-op Referring Physician: Dr Delana Meyer Vascular, Dr Doy Hutching primary Cardiologist Dr Ubaldo Glassing  Luis Byrd is an 76 y.o. male.  HPI: Pt is a 76 y/o male with known AFIB on coumadin but it was found that his INR was low. He c/o of acute left sided thigh pain then it moved to his foot. He was seen in the ER and had a cold pulseless foot. He underwent a vascular procedure to relieve the clot.Marland Kitchen He now feels reasonably well. He denies cp or sob. He is followed by Dr Ubaldo Glassing for AFIB. He denies any bleeding   Past Medical History:  Diagnosis Date  . Arthritis   . Blind   . BPH (benign prostatic hyperplasia)   . CHF (congestive heart failure) ([place])   . Chronic a-fib ([place])   . Dysrhythmia    ATRIAL FIB  . HLD (hyperlipidemia)   . Hypertension     Past Surgical History:  Procedure Laterality Date  . EMBOLECTOMY Left 06/09/2016   Procedure: EMBOLECTOMY;  Surgeon: Katha Cabal, MD;  Location: [facility] ORS;  Service: Vascular;  Laterality: Left;  . HERNIA REPAIR    . KNEE SURGERY      Family History  Problem Relation Age of Onset  . Heart attack Father   . Cancer Mother     Social History:  reports that he quit smoking about 36 years ago. He has never used smokeless tobacco. He reports that he does not drink alcohol or use drugs.  Allergies: No Known Allergies  Medications: I have reviewed the patient's current medications.  Results for orders placed or performed during the hospital encounter of 06/09/16 (from the past 48 hour(s))  APTT     Status: None   Collection Time: 06/09/16 12:18 PM  Result Value Ref Range   aPTT 27 24 - 36 seconds  Basic metabolic panel     Status: Abnormal   Collection Time: 06/09/16 12:18 PM  Result Value Ref Range   Sodium 142 135 - 145 mmol/L   Potassium 3.1 (L) 3.5 - 5.1 mmol/L   Chloride 105 101 - 111 mmol/L   CO2 25 22 - 32 mmol/L   Glucose, Bld 169 (H) 65 - 99 mg/dL   BUN 20 6 - 20 mg/dL   Creatinine, Ser 1.31 (H)  0.61 - 1.24 mg/dL   Calcium 9.8 8.9 - 10.3 mg/dL   GFR calc non Af Amer 51 (L) >60 mL/min   GFR calc Af Amer 59 (L) >60 mL/min    Comment: (NOTE) The eGFR has been calculated using the CKD EPI equation. This calculation has not been validated in all clinical situations. eGFR's persistently <60 mL/min signify possible Chronic Kidney Disease.    Anion gap 12 5 - 15  CBC     Status: None   Collection Time: 06/09/16 12:18 PM  Result Value Ref Range   WBC 7.0 3.8 - 10.6 K/uL   RBC 5.15 4.40 - 5.90 MIL/uL   Hemoglobin 15.6 13.0 - 18.0 g/dL   HCT 44.4 40.0 - 52.0 %   MCV 86.1 80.0 - 100.0 fL   MCH 30.4 26.0 - 34.0 pg   MCHC 35.3 32.0 - 36.0 g/dL   RDW 14.5 11.5 - 14.5 %   Platelets 191 150 - 440 K/uL  Fibrin derivatives D-Dimer ([facility] only)     Status: Abnormal   Collection Time: 06/09/16 12:18 PM  Result Value Ref Range   Fibrin derivatives D-dimer ([facility]) 991 (H)  0 - 499    Comment: <> Exclusion of Venous Thromboembolism (VTE) - OUTPATIENTS ONLY        (Emergency Department or [place])             0-499 ng/ml (FEU)  : With a low to intermediate pretest                                        probability for VTE this test result                                        excludes the diagnosis of VTE.           > 499 ng/ml (FEU)  : VTE not excluded.  Additional work up                                   for VTE is required.   <>  Testing on Inpatients and Evaluation of Disseminated Intravascular        Coagulation ([facility])             Reference Range:   0-499 ng/ml (FEU)   Protime-INR     Status: Abnormal   Collection Time: 06/09/16 12:18 PM  Result Value Ref Range   Prothrombin Time 19.1 (H) 11.4 - 15.2 seconds   INR 1.59   Type and screen [facility]     Status: None   Collection Time: 06/09/16  8:31 PM  Result Value Ref Range   ABO/RH(D) AB POS    Antibody Screen NEG    Sample Expiration 06/12/2016   Glucose, capillary     Status: Abnormal   Collection Time:  06/09/16  8:53 PM  Result Value Ref Range   Glucose-Capillary 117 (H) 65 - 99 mg/dL  Heparin level (unfractionated)     Status: None   Collection Time: 06/10/16  6:15 AM  Result Value Ref Range   Heparin Unfractionated 0.47 0.30 - 0.70 IU/mL    Comment:        IF HEPARIN RESULTS ARE BELOW EXPECTED VALUES, AND PATIENT DOSAGE HAS BEEN CONFIRMED, SUGGEST FOLLOW UP TESTING OF ANTITHROMBIN III LEVELS.   Basic metabolic panel     Status: Abnormal   Collection Time: 06/10/16  6:15 AM  Result Value Ref Range   Sodium 143 135 - 145 mmol/L   Potassium 2.9 (L) 3.5 - 5.1 mmol/L   Chloride 106 101 - 111 mmol/L   CO2 29 22 - 32 mmol/L   Glucose, Bld 94 65 - 99 mg/dL   BUN 19 6 - 20 mg/dL   Creatinine, Ser 1.15 0.61 - 1.24 mg/dL   Calcium 8.6 (L) 8.9 - 10.3 mg/dL   GFR calc non Af Amer 60 (L) >60 mL/min   GFR calc Af Amer >60 >60 mL/min    Comment: (NOTE) The eGFR has been calculated using the CKD EPI equation. This calculation has not been validated in all clinical situations. eGFR's persistently <60 mL/min signify possible Chronic Kidney Disease.    Anion gap 8 5 - 15  CBC     Status: Abnormal   Collection Time: 06/10/16  6:15 AM  Result Value Ref Range   WBC 6.9 3.8 - 10.6  K/uL   RBC 4.23 (L) 4.40 - 5.90 MIL/uL   Hemoglobin 12.9 (L) 13.0 - 18.0 g/dL   HCT 36.9 (L) 40.0 - 52.0 %   MCV 87.2 80.0 - 100.0 fL   MCH 30.4 26.0 - 34.0 pg   MCHC 34.9 32.0 - 36.0 g/dL   RDW 15.2 (H) 11.5 - 14.5 %   Platelets 162 150 - 440 K/uL    US Venous Img Lower Unilateral Left  Result Date: 06/09/2016 CLINICAL DATA:  Acute left leg pain for 7 hours EXAM: LEFT LOWER EXTREMITY VENOUS DOPPLER ULTRASOUND TECHNIQUE: Gray-scale sonography with graded compression, as well as color Doppler and duplex ultrasound were performed to evaluate the lower extremity deep venous systems from the level of the common femoral vein and including the common femoral, femoral, profunda femoral, popliteal and calf veins  including the posterior tibial, peroneal and gastrocnemius veins when visible. The superficial great saphenous vein was also interrogated. Spectral Doppler was utilized to evaluate flow at rest and with distal augmentation maneuvers in the common femoral, femoral and popliteal veins. COMPARISON:  None. FINDINGS: Contralateral Common Femoral Vein: Respiratory phasicity is normal and symmetric with the symptomatic side. No evidence of thrombus. Normal compressibility. Common Femoral Vein: No evidence of thrombus. Normal compressibility, respiratory phasicity and response to augmentation. Saphenofemoral Junction: No evidence of thrombus. Normal compressibility and flow on color Doppler imaging. Profunda Femoral Vein: No evidence of thrombus. Normal compressibility and flow on color Doppler imaging. Femoral Vein: No evidence of thrombus. Normal compressibility, respiratory phasicity and response to augmentation. Popliteal Vein: No evidence of thrombus. Normal compressibility, respiratory phasicity and response to augmentation. Calf Veins: No evidence of thrombus. Normal compressibility and flow on color Doppler imaging. Superficial Great Saphenous Vein: No evidence of thrombus. Normal compressibility and flow on color Doppler imaging. Venous Reflux:  None. Other Findings:  None. IMPRESSION: No evidence of deep venous thrombosis. Electronically Signed   By: Jerilynn Mages.  Shick M.D.   On: 06/09/2016 14:18    Review of Systems  Constitutional: Positive for malaise/fatigue.  HENT: Negative.   Eyes:       Blindness  Respiratory: Negative.   Cardiovascular: Positive for palpitations and leg swelling.  Gastrointestinal: Negative.   Genitourinary: Negative.   Musculoskeletal: Negative.   Skin: Negative.   Neurological: Positive for weakness.  Endo/Heme/Allergies: Negative.   Psychiatric/Behavioral: Negative.    Blood pressure (!) 128/55, pulse 74, temperature 98.3 F (36.8 C), temperature source Oral, resp. rate 20,  height 5' 9"  (1.753 m), weight 77.1 kg (170 lb), SpO2 97 %. Physical Exam  Vitals reviewed. Constitutional: He is oriented to person, place, and time. He appears well-developed and well-nourished.  HENT:  Head: Normocephalic and atraumatic.  Eyes: Conjunctivae are normal.  blindness  Neck: Normal range of motion. Neck supple.  Cardiovascular: Normal rate, S1 normal and normal pulses.  An irregularly irregular rhythm present.  Murmur heard.  Systolic murmur is present with a grade of 2/6  Respiratory: Effort normal and breath sounds normal.  GI: Soft. Bowel sounds are normal.  Musculoskeletal: Normal range of motion.  Neurological: He is alert and oriented to person, place, and time. He has normal reflexes.  Skin: Skin is warm and dry.  Psychiatric: He has a normal mood and affect.    Assessment/Plan: Post op thrombectomy left leg AFIB controlled Sub therapeutic INR HTN Hx CHF Hyperlipidemia DJD Blindness . PLAN Agree with post op care Continue heparin for short term anticoug Switch to Eliquis for long term antiicoug Agree  with Bp control Correct electrolytes Mild hydration for mild CRI D/C coumadin F/U with cardiology as outpt   Ramandeep Arington D Danniela Mcbrearty 06/10/2016, 1:55 PM

## 2016-06-10 NOTE — Care Management (Signed)
Patient admitted after having open embolectomy left leg.  Currently on heparin drip and anticipate will discharge home either on Xarelto or Eliquis.  Patient lives with his son and he is totally blind since May 2017.  Etiology unknown per son.  Patient is able to perform his adls and does not use any assistive device for ambulation.

## 2016-06-10 NOTE — Care Management Important Message (Signed)
Important Message  Patient Details  Name: Luis RamsayLorin B Kozloski MRN: [id number] Date of Birth: 06-07-1940   Medicare Important Message Given:  Yes Son signed the form   Eber HongGreene, Anh Bigos R, [place]RN 06/10/2016, 4:05 PM

## 2016-06-11 LAB — CBC
HCT: 35 % — ABNORMAL LOW (ref 40.0–52.0)
Hemoglobin: 12 g/dL — ABNORMAL LOW (ref 13.0–18.0)
MCH: 29.9 pg (ref 26.0–34.0)
MCHC: 34.4 g/dL (ref 32.0–36.0)
MCV: 86.9 fL (ref 80.0–100.0)
PLATELETS: 133 10*3/uL — AB (ref 150–440)
RBC: 4.03 MIL/uL — AB (ref 4.40–5.90)
RDW: 14.6 % — ABNORMAL HIGH (ref 11.5–14.5)
WBC: 6.3 10*3/uL (ref 3.8–10.6)

## 2016-06-11 LAB — BASIC METABOLIC PANEL
Anion gap: 6 (ref 5–15)
Anion gap: 6 (ref 5–15)
BUN: 19 mg/dL (ref 6–20)
BUN: 21 mg/dL — AB (ref 6–20)
CALCIUM: 8.3 mg/dL — AB (ref 8.9–10.3)
CALCIUM: 8.4 mg/dL — AB (ref 8.9–10.3)
CO2: 30 mmol/L (ref 22–32)
CO2: 29 mmol/L (ref 22–32)
CREATININE: 1.07 mg/dL (ref 0.61–1.24)
CREATININE: 1.33 mg/dL — AB (ref 0.61–1.24)
Chloride: 104 mmol/L (ref 101–111)
Chloride: 100 mmol/L — ABNORMAL LOW (ref 101–111)
GFR calc Af Amer: >60 mL/min (ref >60)
GFR calc Af Amer: 58 mL/min — ABNORMAL LOW (ref >60)
GFR calc non Af Amer: >60 mL/min (ref >60)
GFR, EST NON AFRICAN AMERICAN: 50 mL/min — AB (ref >60)
GLUCOSE: 130 mg/dL — AB (ref 65–99)
Glucose, Bld: 96 mg/dL (ref 65–99)
POTASSIUM: 3.1 mmol/L — AB (ref 3.5–5.1)
Potassium: 3.4 mmol/L — ABNORMAL LOW (ref 3.5–5.1)
Sodium: 140 mmol/L (ref 135–145)
Sodium: 135 mmol/L (ref 135–145)

## 2016-06-11 LAB — SURGICAL PATHOLOGY

## 2016-06-11 LAB — MAGNESIUM: MAGNESIUM: 1.8 mg/dL (ref 1.7–2.4)

## 2016-06-11 MED ORDER — APIXABAN 5 MG PO TABS: 5.0000 mg | ORAL_TABLET | Freq: Two times a day (BID) | ORAL | 11 refills | Status: AC

## 2016-06-11 MED ORDER — METOPROLOL TARTRATE 25 MG PO TABS
25.0000 mg | ORAL_TABLET | Freq: Two times a day (BID) | ORAL | Status: DC
  Administered 2016-06-11: 25 mg via ORAL
  Filled 2016-06-11: qty 1

## 2016-06-11 MED ORDER — APIXABAN 5 MG PO TABS: 5.0000 mg | ORAL_TABLET | Freq: Two times a day (BID) | ORAL | 11 refills | Status: DC

## 2016-06-11 MED ORDER — PANTOPRAZOLE SODIUM 40 MG PO TBEC: 40.0000 mg | DELAYED_RELEASE_TABLET | Freq: Every day | ORAL | 11 refills | Status: DC

## 2016-06-11 MED ORDER — PANTOPRAZOLE SODIUM 40 MG PO TBEC: 40.0000 mg | DELAYED_RELEASE_TABLET | Freq: Every day | ORAL | 11 refills | Status: AC

## 2016-06-11 MED ORDER — CYCLOBENZAPRINE HCL 10 MG PO TABS: 10.0000 mg | ORAL_TABLET | Freq: Three times a day (TID) | ORAL | 0 refills | Status: AC | PRN

## 2016-06-11 MED ORDER — TRAMADOL HCL 50 MG PO TABS: 50.0000 mg | ORAL_TABLET | Freq: Four times a day (QID) | ORAL | Status: DC | PRN

## 2016-06-11 MED ORDER — METOPROLOL TARTRATE 5 MG/5ML IV SOLN: 5.0000 mg | Freq: Four times a day (QID) | INTRAVENOUS | Status: DC | PRN

## 2016-06-11 MED ORDER — TRAMADOL HCL 50 MG PO TABS: 50.0000 mg | ORAL_TABLET | Freq: Four times a day (QID) | ORAL | 0 refills | Status: AC | PRN

## 2016-06-11 MED ORDER — MAGNESIUM SULFATE 2 GM/50ML IV SOLN
2.0000 g | Freq: Once | INTRAVENOUS | Status: AC
  Administered 2016-06-11: 2 g via INTRAVENOUS
  Filled 2016-06-11: qty 50

## 2016-06-11 MED ORDER — POTASSIUM CHLORIDE CRYS ER 20 MEQ PO TBCR
40.0000 meq | EXTENDED_RELEASE_TABLET | Freq: Once | ORAL | Status: AC
  Administered 2016-06-11: 40 meq via ORAL
  Filled 2016-06-11: qty 2

## 2016-06-11 NOTE — Evaluation (Signed)
Physical Therapy Evaluation Patient Details Name: Luis Byrd MRN: [id number] DOB: 07/11/1940 Today's Date: 06/11/2016   History of Present Illness  The patient is a 77 year old male with a past medical history of HTN, Afib and CHF who is s/p open embolectomy of the left common femoral superficial femoral and profunda femoris arteries for embolus of the left lower extremitywith ischemia of the left foot - stable.   Clinical Impression  Pt alert and oriented and in good spirits reporting significant decrease in LLE pain compared to pre-embolectomy levels.  Pt independent and confident with bed mobility and transfers with good stability.  Pt able to amb 200' with HHA secondary to pt's visual impairment.  Pt steady with gait without LOB.  Pt conversational during entire ambulation session without SOB or other adverse symptoms.  Pt reports decreased pain at incision site after ambulation compared to baseline.  Pt reports being at baseline functionally and is not appropriate for skilled PT services at this time.  PT orders will be completed and will reassess at a future date if there is a change in status upon receipt of new PT orders.    Follow Up Recommendations No PT follow up    Equipment Recommendations  None recommended by PT    Recommendations for Other Services       Precautions / Restrictions Precautions Precautions: Other (comment) (Pt blind) Restrictions Weight Bearing Restrictions: No      Mobility  Bed Mobility Overal bed mobility: Independent                Transfers Overall transfer level: Independent                  Ambulation/Gait Ambulation/Gait assistance: Supervision Ambulation Distance (Feet): 200 Feet Assistive device: 1 person hand held assist (HHA secondary to visual impairment) Gait Pattern/deviations: Decreased step length - right;Decreased step length - left Gait velocity: Cautious gait with slow cadence secondary to unfamiliar  environment with visual impairment, cadence improved with verbal cues that pt was in open space without obstacles.   General Gait Details: Pt steady with amb with HHA secondary to visual impairment  Stairs            Wheelchair Mobility    Modified Rankin (Stroke Patients Only)       Balance Overall balance assessment: Independent                                           Pertinent Vitals/Pain Pain Assessment: 0-10 Pain Score: 4  Pain Location: Incision site Pain Descriptors / Indicators: Sore Pain Intervention(s): Monitored during session    Home Living Family/patient expects to be discharged to:: Private residence Living Arrangements: Children;Other relatives Available Help at Discharge: Family Type of Home: House Home Access: Stairs to enter Entrance Stairs-Rails: Left Entrance Stairs-Number of Steps: 3 Home Layout: One level Home Equipment: Walker - 2 wheels;Cane - single point      Prior Function Level of Independence: Independent         Comments: Pt ind with amb without AD, no fall history     Hand Dominance        Extremity/Trunk Assessment   Upper Extremity Assessment Upper Extremity Assessment: Overall WFL for tasks assessed    Lower Extremity Assessment Lower Extremity Assessment: Overall WFL for tasks assessed       Communication  Communication: No difficulties  Cognition Arousal/Alertness: Awake/alert Behavior During Therapy: WFL for tasks assessed/performed Overall Cognitive Status: Within Functional Limits for tasks assessed                      General Comments      Exercises     Assessment/Plan    PT Assessment Patent does not need any further PT services  PT Problem List            PT Treatment Interventions      PT Goals (Current goals can be found in the Care Plan section)  Acute Rehab PT Goals Patient Stated Goal: To return home PT Goal Formulation: With patient Time For Goal  Achievement: 06/23/16 Potential to Achieve Goals: Good    Frequency     Barriers to discharge        Co-evaluation               End of Session Equipment Utilized During Treatment: Gait belt   Patient left: in bed;with bed alarm set;with call bell/phone within reach           Time: 1050-1110 PT Time Calculation (min) (ACUTE ONLY): 20 min   Charges:   PT Evaluation $PT Eval Low Complexity: 1 Procedure     PT G Codes:        Elly Modena. Scott Jermani Pund PT, DPT 06/11/16, 1:33 PM

## 2016-06-11 NOTE — Care Management (Signed)
Provided with Eliquis coupon

## 2016-06-11 NOTE — Progress Notes (Signed)
Discharge instructions explained to pt and pts son/ verbalized an understanding/ iv and tele removed/ eliquis coupon given to pt/ transported off  Unit via wheelchair.

## 2016-06-11 NOTE — Care Management (Signed)
Patient resides at this address- [address] [place].  So far it does not appear that decision has been made on anticoagulation agent- Xarelto or Eliquis

## 2016-06-11 NOTE — Discharge Summary (Signed)
[facility]    Discharge Summary    Patient ID:  Luis Byrd MRN: [id number] DOB/AGE: 01-10-1941 76 y.o.  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 Arthritis   . Blind   . BPH (benign prostatic hyperplasia)   . CHF (congestive heart failure) (HCC)   . Chronic a-fib (HCC)   . Dysrhythmia    ATRIAL FIB  . HLD (hyperlipidemia)   . Hypertension     Procedure(s): EMBOLECTOMY  Discharged Condition: good  HPI:  The patient presented to the emergency room on the day of admission and was found to have an acutely ischemic left leg. Workup was consistent with embolization secondary to atrial fibrillation. He says was taken to surgery where embolectomy was performed through the left femoral approach. This was successful. Postoperatively he has done well. He was evaluated by medicine and cardiology. He has been started on Eliquis. We will stop his Coumadin. He will follow-up with me in the office in approximately 2 weeks for staple removal.  Hospital Course:  Luis Byrd is a 76 y.o. male is S/P Left Procedure(s): EMBOLECTOMY Extubated: POD # 0 Physical exam: Patient's left groin is clean dry and intact with minimal hematoma and a small amount of ecchymoses. He has a 2+ palpable posterior tibial pulse on the left. Post-op wounds clean, dry, intact or healing well Pt. Ambulating, voiding and taking PO diet without difficulty. Pt pain controlled with PO pain meds. Labs as below Complications:none  Consults:    Significant Diagnostic Studies: CBC Lab Results  Component Value Date   WBC 6.3 06/11/2016   HGB 12.0 (L) 06/11/2016   HCT 35.0 (L) 06/11/2016   MCV 86.9 06/11/2016   PLT 133 (L) 06/11/2016    BMET    Component Value Date/Time   NA 135 06/11/2016 1626   NA 139 01/18/2013 0609   K 3.4 (L) 06/11/2016 1626   K 3.2 (L) 01/18/2013 0609   CL 100 (L) 06/11/2016 1626   CL 100 01/18/2013 0609   CO2 29 06/11/2016 1626   CO2 34 (H) 01/18/2013 0609   GLUCOSE 130 (H) 06/11/2016 1626   GLUCOSE 104 (H) 01/18/2013 0609   BUN 21 (H) 06/11/2016 1626   BUN 24 (H) 01/18/2013 0609   CREATININE 1.33 (H) 06/11/2016 1626   CREATININE 1.30 01/18/2013 0609   CALCIUM 8.4 (L) 06/11/2016 1626   CALCIUM 8.8 01/18/2013 0609   GFRNONAA 50 (L) 06/11/2016 1626   GFRNONAA 54 (L) 01/18/2013 0609   GFRAA 58 (L) 06/11/2016 1626   GFRAA >60 01/18/2013 0609   COAG Lab Results  Component Value Date   INR 1.59 06/09/2016   INR 1.07 12/03/2015   INR 3.55 (H) 10/15/2015     Disposition:  Discharge to :Home Discharge Instructions    Call MD for:  redness, tenderness, or signs of infection (pain, swelling, bleeding, redness, odor or green/yellow discharge around incision site)    Complete by:  As directed    Call MD for:  severe or increased pain, loss or decreased feeling  in affected limb(s)    Complete by:  As directed    Call MD for:  temperature >100.5    Complete by:  As directed    Discharge instructions    Complete by:  As directed    Okay to shower beginning Sunday  Remove the bandage left groin Sunday and apply a clean bandage daily as needed   No dressing needed    Complete by:  As directed    Replace only if drainage present   Resume previous diet    Complete by:  As directed       Verbal and written Discharge instructions given to the patient. Wound care per Discharge AVS Follow-up Information    SPARKS,JEFFREY D, MD. Schedule an appointment as soon as possible for a visit in 1 week(s).   Specialty:  Internal Medicine Why:  Call office to make the appointment Contact  information: [address] [facility] [place] [address] [phone]        Dalia HeadingKenneth A Fath, MD Follow up in 2 week(s).   Specialty:  Cardiology Contact information: [address] [facility] [place] [address] [phone]        Levora DredgeGregory Flint Hakeem, MD Follow up in 2 week(s).   Specialties:  Vascular Surgery, Cardiology, Radiology, Vascular Surgery Why:  OK with me or Selena BattenKim needs to have staples removed Contact information: [address] [place] [address] [phone]           Signed: Levora DredgeGregory Auston Halfmann, MD  06/11/2016, 6:48 PM

## 2016-06-11 NOTE — Progress Notes (Signed)
Dr. Juliann Paresallwood paged to make aware of V. Tach on tele today/ 14 beat earlier today / and 8 beat reported this evening/ pt asymptomatic/ will continue to monitor closely.

## 2016-06-11 NOTE — Discharge Instructions (Signed)
You were evaluated for her severe leg pain which is now essentially gone, and as we discussed her exam and evaluation are reassuring and I suspect this was a muscle spasm. He may use heat and massage to help with the sore muscle. He may try over-the-counter ibuprofen for inflammation and mild discomfort/pain, and I am prescribing muscle relaxer medication for use as needed.  Return to the emergency room for any worsening symptoms such as uncontrolled pain, redness leg numbness or weakness, and certainly any additional symptoms such as chest pain or trouble breathing, slurred speech, arm weakness or numbness.  Information on my medicine - ELIQUIS (apixaban)  This medication education was reviewed with me or my healthcare representative as part of my discharge preparation.  The pharmacist that spoke with me during my hospital stay was:  Olene FlossMelissa D Ahmya Bernick, [facility]PH  Why was Eliquis prescribed for you? Eliquis was prescribed for you to reduce the risk of a blood clot forming that can cause a stroke if you have a medical condition called atrial fibrillation (a type of irregular heartbeat).  What do You need to know about Eliquis ? Take your Eliquis TWICE DAILY - one tablet in the morning and one tablet in the evening with or without food. If you have difficulty swallowing the tablet whole please discuss with your pharmacist how to take the medication safely.  Take Eliquis exactly as prescribed by your doctor and DO NOT stop taking Eliquis without talking to the doctor who prescribed the medication.  Stopping may increase your risk of developing a stroke.  Refill your prescription before you run out.  After discharge, you should have regular check-up appointments with your healthcare provider that is prescribing your Eliquis.  In the future your dose may need to be changed if your kidney function or weight changes by a significant amount or as you get older.  What do you do if you miss a dose? If you  miss a dose, take it as soon as you remember on the same day and resume taking twice daily.  Do not take more than one dose of ELIQUIS at the same time to make up a missed dose.  Important Safety Information A possible side effect of Eliquis is bleeding. You should call your healthcare provider right away if you experience any of the following: ? Bleeding from an injury or your nose that does not stop. ? Unusual colored urine (red or dark brown) or unusual colored stools (red or black). ? Unusual bruising for unknown reasons. ? A serious fall or if you hit your head (even if there is no bleeding).  Some medicines may interact with Eliquis and might increase your risk of bleeding or clotting while on Eliquis. To help avoid this, consult your healthcare provider or pharmacist prior to using any new prescription or non-prescription medications, including herbals, vitamins, non-steroidal anti-inflammatory drugs (NSAIDs) and supplements.  This website has more information on Eliquis (apixaban): http://www.eliquis.com/eliquis/home

## 2016-06-24 ENCOUNTER — Encounter (INDEPENDENT_AMBULATORY_CARE_PROVIDER_SITE_OTHER): Payer: PRIVATE HEALTH INSURANCE | Admitting: Vascular Surgery

## 2016-06-24 ENCOUNTER — Encounter (INDEPENDENT_AMBULATORY_CARE_PROVIDER_SITE_OTHER): Payer: Self-pay | Admitting: Vascular Surgery

## 2016-06-24 ENCOUNTER — Ambulatory Visit (INDEPENDENT_AMBULATORY_CARE_PROVIDER_SITE_OTHER): Payer: Self-pay | Admitting: Vascular Surgery

## 2016-06-24 VITALS — BP 132/70 | HR 90 | Resp 16 | Ht 69.0 in | Wt 174.8 lb

## 2016-06-24 DIAGNOSIS — I482 Chronic atrial fibrillation, unspecified: Secondary | ICD-10-CM

## 2016-06-24 DIAGNOSIS — I10 Essential (primary) hypertension: Secondary | ICD-10-CM

## 2016-06-24 DIAGNOSIS — E782 Mixed hyperlipidemia: Secondary | ICD-10-CM

## 2016-06-24 DIAGNOSIS — I998 Other disorder of circulatory system: Secondary | ICD-10-CM

## 2016-06-24 NOTE — Progress Notes (Signed)
MRN : [id number]  Luis Byrd is a 76 y.o. (21-Dec-1940) male who presents with chief complaint of  Chief Complaint  Patient presents with  . New Patient (Initial Visit)  .  History of Present Illness: The patient returns to the office for followup s/p embolectomy for acute ischemia. There have been no interval changes in lower extremity symptoms. No interval shortening of the patient's claudication distance or development of rest pain symptoms. No new ulcers or wounds have occurred since the last visit.  There have been no significant changes to the patient's overall health care.  The patient denies amaurosis fugax or recent TIA symptoms. There are no recent neurological changes noted. The patient denies history of DVT, PE or superficial thrombophlebitis. The patient denies recent episodes of angina or shortness of breath.    Current Meds  Medication Sig  . acetaminophen (TYLENOL) 500 MG tablet Take 1,000 mg by mouth every 6 (six) hours as needed.  Marland Kitchen amLODipine (NORVASC) 10 MG tablet Take 10 mg by mouth 2 (two) times daily.  Marland Kitchen apixaban (ELIQUIS) 5 MG TABS tablet Take 1 tablet (5 mg total) by mouth 2 (two) times daily.  . Ascorbic Acid (VITAMIN C) 1000 MG tablet Take 1,000 mg by mouth daily.  . cholecalciferol (VITAMIN D) 1000 units tablet Take 1,000 Units by mouth daily.  . cyclobenzaprine (FLEXERIL) 10 MG tablet Take 1 tablet (10 mg total) by mouth 3 (three) times daily as needed for muscle spasms.  Marland Kitchen DIGOX 125 MCG tablet Take 125 mcg by mouth daily.  Marland Kitchen docusate sodium (COLACE) 50 MG capsule Take 150 mg by mouth at bedtime.   Marland Kitchen escitalopram (LEXAPRO) 10 MG tablet Take 10 mg by mouth at bedtime.  . hydrochlorothiazide (HYDRODIURIL) 25 MG tablet Take 25 mg by mouth 2 (two) times daily.  . Multiple Vitamin (MULTIVITAMIN WITH MINERALS) TABS tablet Take 1 tablet by mouth daily.  . pantoprazole (PROTONIX) 40 MG tablet Take 1 tablet (40 mg total) by mouth daily.  . potassium chloride  SA (K-DUR,KLOR-CON) 20 MEQ tablet Take 40 mEq by mouth 2 (two) times daily.  . prednisoLONE sodium phosphate (INFLAMASE FORTE) 1 % ophthalmic solution Place 1 drop into both eyes 2 (two) times daily.  . predniSONE (DELTASONE) 10 MG tablet Please take 3 tablets for 1 day, then 2 tablets for 2 days, then continue 10 mg oral daily thereafter (Patient taking differently: Take 10 mg by mouth daily. Please take 3 tablets for 1 day, then 2 tablets for 2 days, then continue 10 mg oral daily thereafter)    Past Medical History:  Diagnosis Date  . Arthritis   . Blind   . BPH (benign prostatic hyperplasia)   . CHF (congestive heart failure) (HCC)   . Chronic a-fib (HCC)   . Dysrhythmia    ATRIAL FIB  . HLD (hyperlipidemia)   . Hypertension     Past Surgical History:  Procedure Laterality Date  . EMBOLECTOMY Left 06/09/2016   Procedure: EMBOLECTOMY;  Surgeon: Renford Dills, MD;  Location: [facility] ORS;  Service: Vascular;  Laterality: Left;  . HERNIA REPAIR    . KNEE SURGERY      Social History Social History  Substance Use Topics  . Smoking status: Former Smoker    Quit date: 05/24/1980  . Smokeless tobacco: Never Used  . Alcohol use No    Family History Family History  Problem Relation Age of Onset  . Heart attack Father   . Cancer Mother  No family history of bleeding/clotting disorders, porphyria or autoimmune disease   No Known Allergies   REVIEW OF SYSTEMS (Negative unless checked)  Constitutional: [] Weight loss  [] Fever  [] Chills Cardiac: [] Chest pain   [] Chest pressure   [] Palpitations   [] Shortness of breath when laying flat   [] Shortness of breath with exertion. Vascular:  [] Pain in legs with walking   [] Pain in legs at rest  [] History of DVT   [] Phlebitis   [] Swelling in legs   [] Varicose veins   [] Non-healing ulcers Pulmonary:   [] Uses home oxygen   [] Productive cough   [] Hemoptysis   [] Wheeze  [] COPD   [] Asthma Neurologic:  [] Dizziness   [] Seizures   [] History of  stroke   [] History of TIA  [] Aphasia   [] Vissual changes   [] Weakness or numbness in arm   [] Weakness or numbness in leg Musculoskeletal:   [] Joint swelling   [] Joint pain   [] Low back pain Hematologic:  [] Easy bruising  [] Easy bleeding   [] Hypercoagulable state   [] Anemic Gastrointestinal:  [] Diarrhea   [] Vomiting  [] Gastroesophageal reflux/heartburn   [] Difficulty swallowing. Genitourinary:  [] Chronic kidney disease   [] Difficult urination  [] Frequent urination   [] Blood in urine Skin:  [] Rashes   [] Ulcers  Psychological:  [] History of anxiety   []  History of major depression.  Physical Examination  Vitals:   06/24/16 1602  BP: 132/70  Pulse: 90  Resp: 16  Weight: 174 lb 12.8 oz (79.3 kg)  Height: 5\' 9"  (1.753 m)   Body mass index is 25.81 kg/m. Gen: WD/WN, NAD Head: [place]/AT, No temporalis wasting.  Ear/Nose/Throat: Hearing grossly intact, nares w/o erythema or drainage, poor dentition Eyes: PER, EOMI, sclera nonicteric.  Neck: Supple, no masses.  No bruit or JVD.  Pulmonary:  Good air movement, clear to auscultation bilaterally, no use of accessory muscles.  Cardiac: RRR, normal S1, S2, no Murmurs. Vascular: left groin incision well healed Vessel Right Left  Radial Palpable Palpable  Ulnar Palpable Palpable  Brachial Palpable Palpable  Carotid Palpable Palpable  Femoral Palpable Palpable  Popliteal Palpable Palpable  PT Palpable Palpable  DP Palpable Palpable   Gastrointestinal: soft, non-distended. No guarding/no peritoneal signs.  Musculoskeletal: M/S 5/5 throughout.  No deformity or atrophy.  Neurologic: CN 2-12 intact. Pain and light touch intact in extremities.  Symmetrical.  Speech is fluent. Motor exam as listed above. Psychiatric: Judgment intact, Mood & affect appropriate for pt's clinical situation. Dermatologic: No rashes or ulcers noted.  No changes consistent with cellulitis. Lymph : No Cervical lymphadenopathy, no lichenification or skin changes of chronic  lymphedema.  CBC Lab Results  Component Value Date   WBC 6.3 06/11/2016   HGB 12.0 (L) 06/11/2016   HCT 35.0 (L) 06/11/2016   MCV 86.9 06/11/2016   PLT 133 (L) 06/11/2016    BMET    Component Value Date/Time   NA 135 06/11/2016 1626   NA 139 01/18/2013 0609   K 3.4 (L) 06/11/2016 1626   K 3.2 (L) 01/18/2013 0609   CL 100 (L) 06/11/2016 1626   CL 100 01/18/2013 0609   CO2 29 06/11/2016 1626   CO2 34 (H) 01/18/2013 0609   GLUCOSE 130 (H) 06/11/2016 1626   GLUCOSE 104 (H) 01/18/2013 0609   BUN 21 (H) 06/11/2016 1626   BUN 24 (H) 01/18/2013 0609   CREATININE 1.33 (H) 06/11/2016 1626   CREATININE 1.30 01/18/2013 0609   CALCIUM 8.4 (L) 06/11/2016 1626   CALCIUM 8.8 01/18/2013 0609   GFRNONAA 50 (L)  06/11/2016 1626   GFRNONAA 54 (L) 01/18/2013 0609   GFRAA 58 (L) 06/11/2016 1626   GFRAA >60 01/18/2013 0609   Estimated Creatinine Clearance: 46.5 mL/min (by C-G formula based on SCr of 1.33 mg/dL (H)).  COAG Lab Results  Component Value Date   INR 1.59 06/09/2016   INR 1.07 12/03/2015   INR 3.55 (H) 10/15/2015    Radiology US Venous Img Lower Unilateral Left  Result Date: 06/09/2016 CLINICAL DATA:  Acute left leg pain for 7 hours EXAM: LEFT LOWER EXTREMITY VENOUS DOPPLER ULTRASOUND TECHNIQUE: Gray-scale sonography with graded compression, as well as color Doppler and duplex ultrasound were performed to evaluate the lower extremity deep venous systems from the level of the common femoral vein and including the common femoral, femoral, profunda femoral, popliteal and calf veins including the posterior tibial, peroneal and gastrocnemius veins when visible. The superficial great saphenous vein was also interrogated. Spectral Doppler was utilized to evaluate flow at rest and with distal augmentation maneuvers in the common femoral, femoral and popliteal veins. COMPARISON:  None. FINDINGS: Contralateral Common Femoral Vein: Respiratory phasicity is normal and symmetric with the  symptomatic side. No evidence of thrombus. Normal compressibility. Common Femoral Vein: No evidence of thrombus. Normal compressibility, respiratory phasicity and response to augmentation. Saphenofemoral Junction: No evidence of thrombus. Normal compressibility and flow on color Doppler imaging. Profunda Femoral Vein: No evidence of thrombus. Normal compressibility and flow on color Doppler imaging. Femoral Vein: No evidence of thrombus. Normal compressibility, respiratory phasicity and response to augmentation. Popliteal Vein: No evidence of thrombus. Normal compressibility, respiratory phasicity and response to augmentation. Calf Veins: No evidence of thrombus. Normal compressibility and flow on color Doppler imaging. Superficial Great Saphenous Vein: No evidence of thrombus. Normal compressibility and flow on color Doppler imaging. Venous Reflux:  None. Other Findings:  None. IMPRESSION: No evidence of deep venous thrombosis. Electronically Signed   By: Judie Petit.  Shick M.D.   On: 06/09/2016 14:18    Assessment/Plan 1. Ischemic leg  Recommend:  The patient has evidence of successful embolectomy.  The patient does not voice lifestyle limiting changes at this point in time.  No invasive studies, angiography or surgery at this time The patient should continue walking and begin a more formal exercise program.  The patient should continue antiplatelet therapy and aggressive treatment of the lipid abnormalities  No changes in the patient's medications at this time  The patient should continue wearing graduated compression socks 10-15 mmHg strength to control the mild edema.   2. Ischemia of left lower extremity See  #1  3. Essential hypertension Continue antihypertensive medications as already ordered, these medications have been reviewed and there are no changes at this time.  4. Chronic atrial fibrillation (HCC) Continue antiarrhythmia medications as already ordered, these medications have been  reviewed and there are no changes at this time.  Continue anticoagulation as ordered by Cardiology Service  5. Mixed hyperlipidemia Continue statin as ordered and reviewed, no changes at this time  Levora Dredge, MD  06/24/2016 10:32 PM

## 2016-10-23 IMAGING — MR MR ORBITS WO/W CM
6 of 9 series · 32 of 48 positions shown · IV contrast (multihance)
Comparison: 10/03/2015 and 09/28/2015

CLINICAL DATA: Eye pain and optic neuritis.

EXAM:
MRI OF THE ORBITS WITHOUT AND WITH CONTRAST
TECHNIQUE: Multiplanar, multisequence MR imaging of the orbits was performed
both before and after the administration of intravenous contrast.
CONTRAST:  18mL MULTIHANCE GADOBENATE DIMEGLUMINE 529 MG/ML IV SOLN

[Series 2: FLAIR · sagittal · 5.0mm · 0.47mm/px · 5 of 23 slices shown]
[im 1/23]
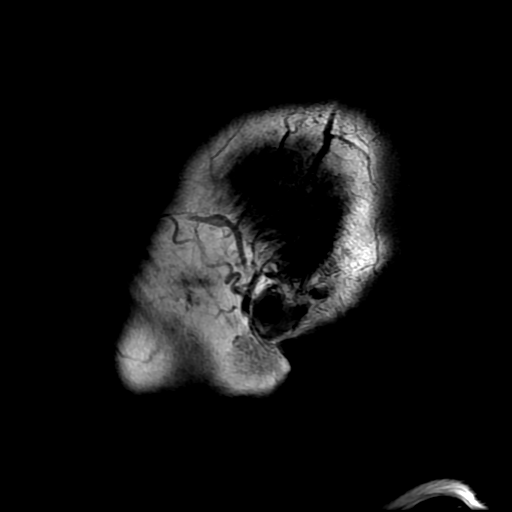
[im 6/23]
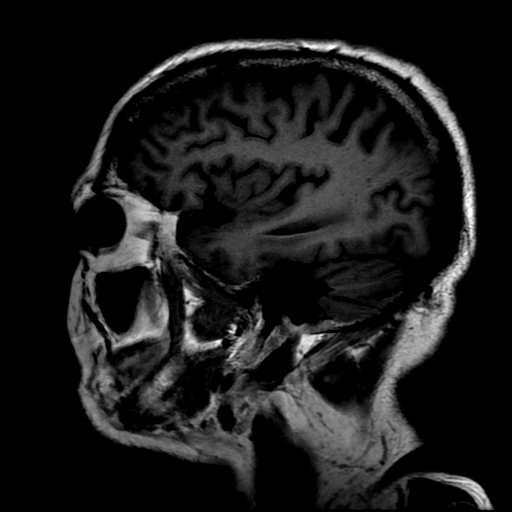
[im 12/23]
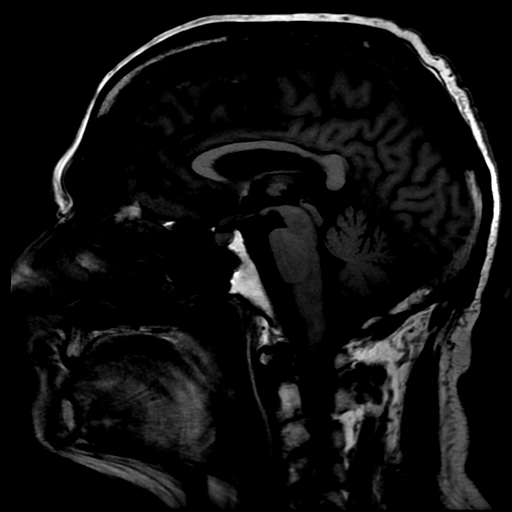
[im 17/23]
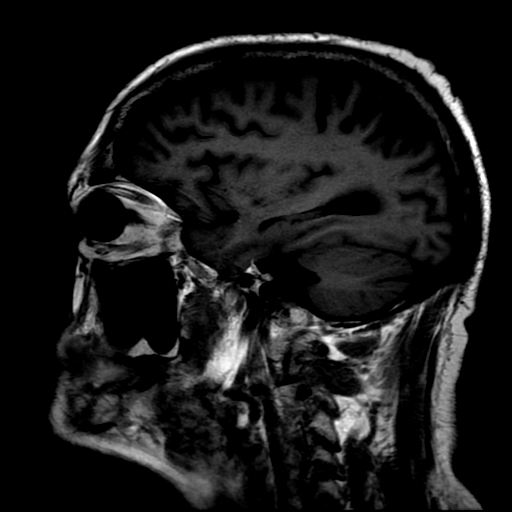
[im 23/23]
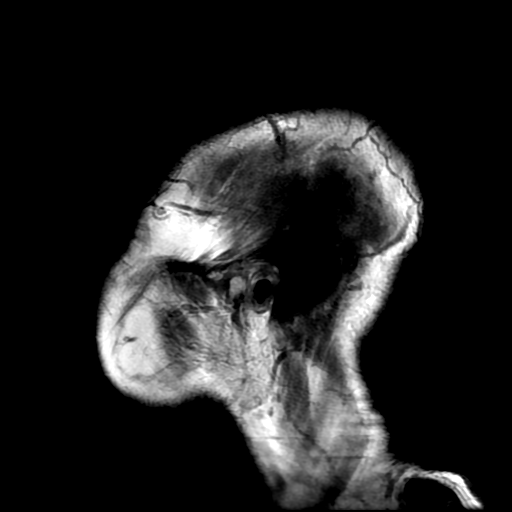

[Series 4: DWI · axial · 3.0mm · 0.94mm/px · z∈[-38,+88]mm · 11 of 96 slices shown]
[im 6/96]
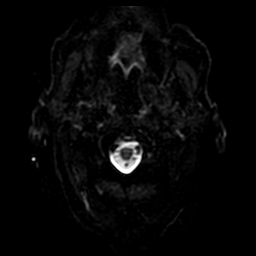
[im 12/96]
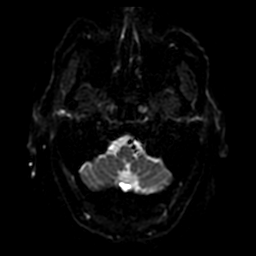
[im 18/96]
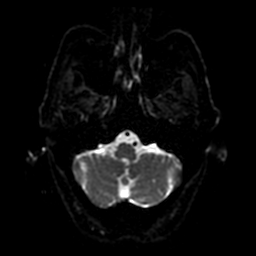
[im 30/96]
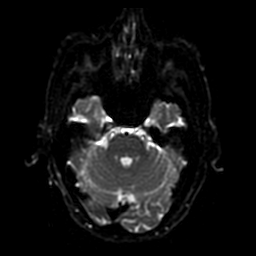
[im 42/96]
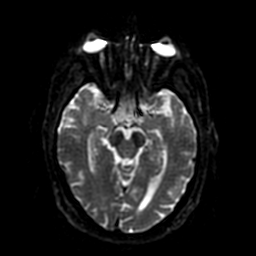
[im 48/96]
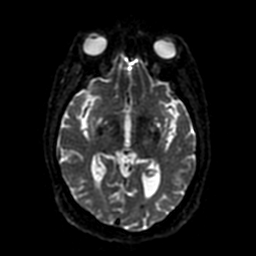
[im 54/96]
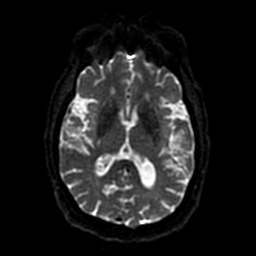
[im 66/96]
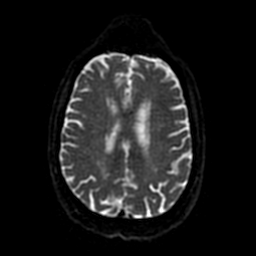
[im 78/96]
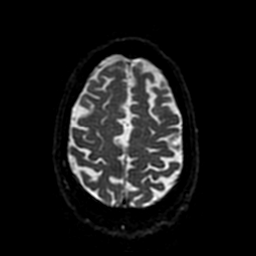
[im 84/96]
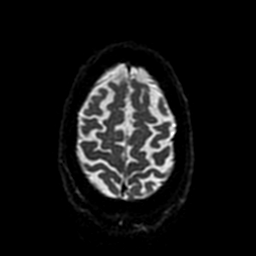
[im 90/96]
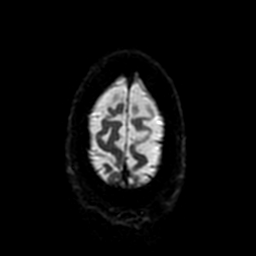

[Series 5: T2 fat-sat · axial · 3.0mm · 0.35mm/px · z∈[-3,+54]mm · 3 of 20 slices shown (1 of 2)]
[im 1/20]
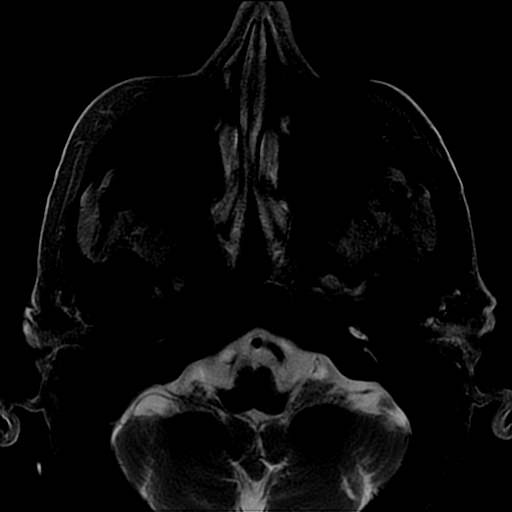
[im 10/20]
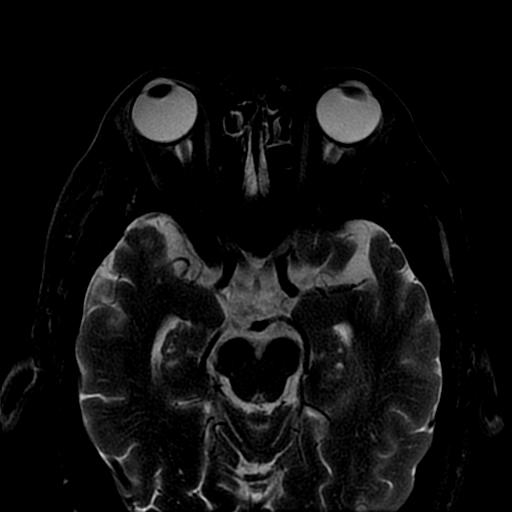
[im 20/20]
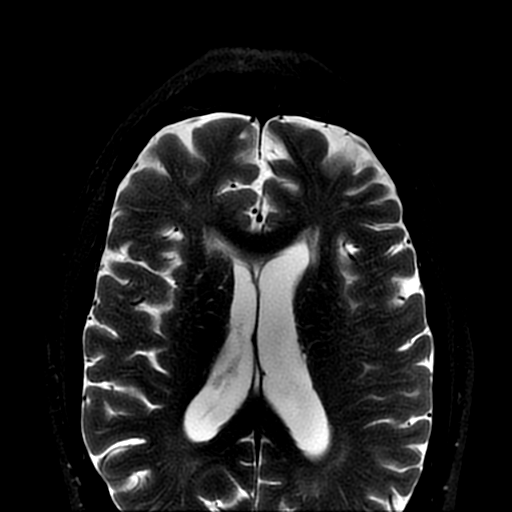

[Series 10: T1 · axial · 3.0mm · 0.35mm/px · z∈[-1,+26]mm · 2 of 20 slices shown]
[im 1/20]
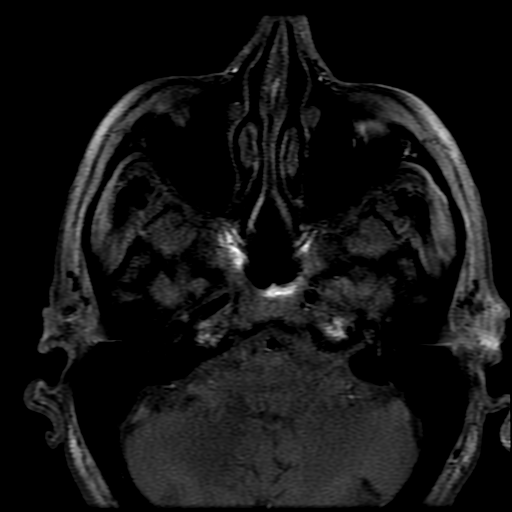
[im 10/20]
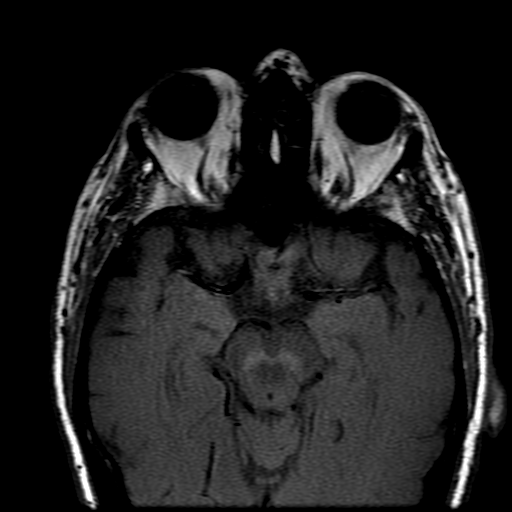

[Series 11: T2 fat-sat · coronal · 4.0mm · 0.35mm/px · 3 of 20 slices shown (2 of 2)]
[im 1/20]
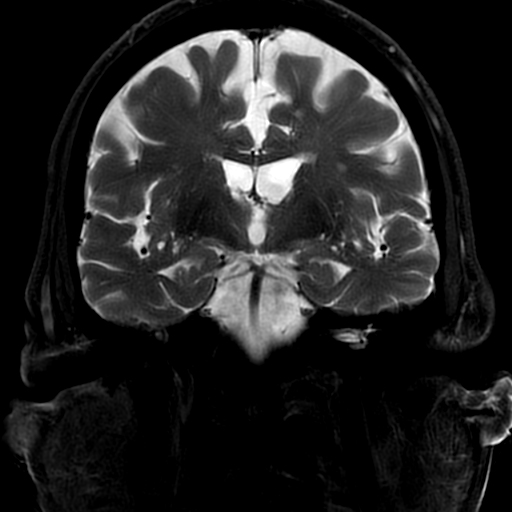
[im 10/20]
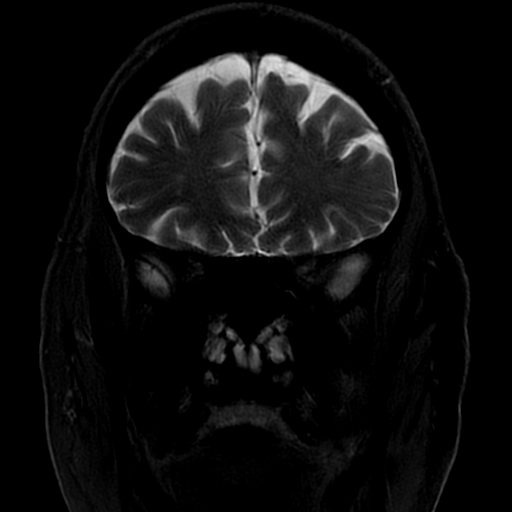
[im 20/20]
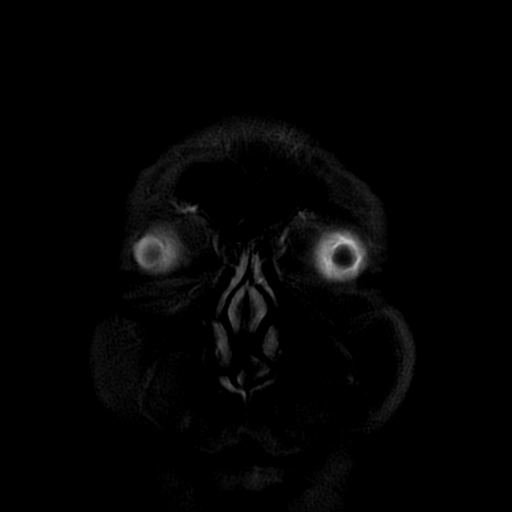

[Series 450: ADC · axial · 3.0mm · 0.94mm/px · z∈[-44,+97]mm · 8 of 48 slices shown]
[im 1/48]
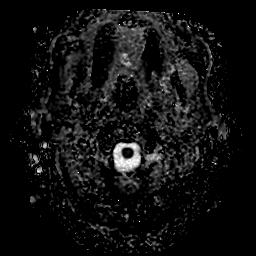
[im 7/48]
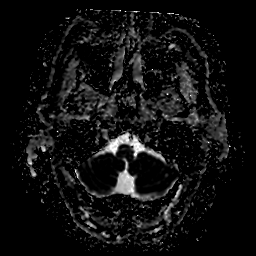
[im 14/48]
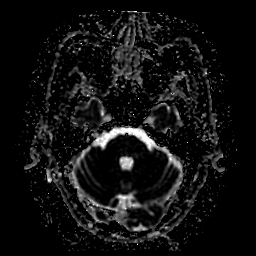
[im 21/48]
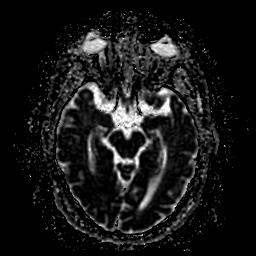
[im 27/48]
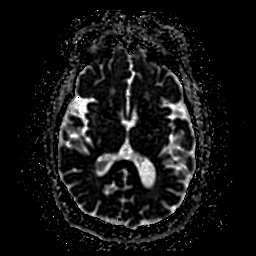
[im 34/48]
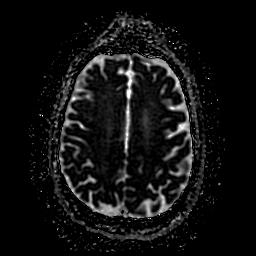
[im 41/48]
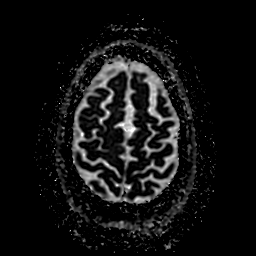
[im 48/48]
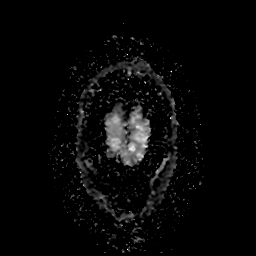

[32 of 48 positions shown; findings below may reference images not displayed]

FINDINGS: Left optic neuritis has evolved. There is decreased edema and
swelling of the left optic nerve but new enhancement at the level of
the orbital apex and pre chiasmatic segment. Enhancement at the
level of the mid and superficial orbit is peripheral and increased.
Based on previous imaging and clinical factors there is likely a
combination of left optic neuritis and subsequent ischemic optic
neuropathy (anterior ischemic optic neuropathy reported on
funduscopic exam).

On the right, mild discontinuous peripheral enhancement at the
orbital apex is stable, but there is new nerve enhancement at and
just proximal to the chiasm.

Stable negative appearance of the globes and extraocular muscles.

No new intracranial white matter disease to suggest intracranial
demyelination. Diffusion restriction as significantly diminished in
the left optic tracts and pre ganglionic radiations. No acute
infarct. Remote small vessel infarcts in the left cerebellum.
IMPRESSION: 1. Newly seen optic neuritis on the right.
2. Evolving optic neuritis/ischemia on the left.

## 2018-01-30 ENCOUNTER — Other Ambulatory Visit: Payer: Self-pay | Admitting: Internal Medicine

## 2018-01-30 DIAGNOSIS — R0609 Other forms of dyspnea: Principal | ICD-10-CM

## 2018-02-07 ENCOUNTER — Ambulatory Visit
Admission: RE | Admit: 2018-02-07 | Discharge: 2018-02-07 | Disposition: A | Discharge status: Home / Self Care | Payer: Medicare Other | Source: Ambulatory Visit | Attending: Internal Medicine | Admitting: Internal Medicine

## 2018-02-07 DIAGNOSIS — I2510 Atherosclerotic heart disease of native coronary artery without angina pectoris: Secondary | ICD-10-CM | POA: Diagnosis not present

## 2018-02-07 DIAGNOSIS — R0609 Other forms of dyspnea: Secondary | ICD-10-CM | POA: Diagnosis present

## 2018-02-07 DIAGNOSIS — I700 Atherosclerosis of aorta: Secondary | ICD-10-CM | POA: Diagnosis not present

## 2018-02-07 DIAGNOSIS — I7789 Other specified disorders of arteries and arterioles: Secondary | ICD-10-CM | POA: Insufficient documentation

## 2018-07-26 ENCOUNTER — Emergency Department (HOSPITAL_COMMUNITY)
Admission: EM | Admit: 2018-07-26 | Discharge: 2018-08-23 | Disposition: E | Payer: Medicare Other | Attending: Emergency Medicine | Admitting: Emergency Medicine

## 2018-07-26 ENCOUNTER — Encounter (HOSPITAL_COMMUNITY): Payer: Self-pay

## 2018-07-26 DIAGNOSIS — I469 Cardiac arrest, cause unspecified: Secondary | ICD-10-CM | POA: Diagnosis not present

## 2018-07-27 ENCOUNTER — Encounter (INDEPENDENT_AMBULATORY_CARE_PROVIDER_SITE_OTHER): Payer: Self-pay | Admitting: Vascular Surgery

## 2018-08-23 NOTE — Code Documentation (Signed)
Ultrasound performed, no cardiac activity, time of death 29-Sep-2027

## 2018-08-23 NOTE — Code Documentation (Signed)
Pulse check, PEA 

## 2018-08-23 NOTE — Progress Notes (Signed)
   2018/08/01 2200  Clinical Encounter Type  Visited With Family  Visit Type Initial;Psychological support;Spiritual support;ED;Death  Referral From Nurse  Consult/Referral To Chaplain  Spiritual Encounters  Spiritual Needs Emotional;Other (Comment) (Spiritual Care Conversation/Support)  Stress Factors  Family Stress Factors Loss;Lack of knowledge   I visited with the patient's family per referral from the nurse. The patient's grandchildren and brother were at the bedside. The family were at peace with the patient passing the way he did in [place] and feel that he is with his wife, who had passed away previously.  The grandchildren were concerned about letting the patient's son know. The patient's son is out of the country and won't be back until Friday. They do not want him to find out through a text message and want to be the ones who tell him about the patient passing.  The nurse and I reassured them that we would respect their wishes.   Please, contact Spiritual Care for further assistance.   Chaplain Clint Bolder M.Div., [facility]

## 2018-08-23 NOTE — ED Notes (Signed)
Paged Dr Judithann Sheen in [place] per Dr Rosalia Hammers

## 2018-08-23 NOTE — Code Documentation (Signed)
Patient time of death occurred at 09-29-2027

## 2018-08-23 NOTE — ED Provider Notes (Addendum)
[facility] EMERGENCY DEPARTMENT Provider Note   CSN: [id number] Arrival date & time: Aug 22, 2018  2023    History   Chief Complaint Chief Complaint  Patient presents with  . Cardiac Arrest    HPI Luis Byrd is a 78 y.o. male.   level 5 caveat  HPI  Patient presented via ems with cpr for 1 hour 15 minutes.  [place] collapsed at church.  EMS reports they had been at seen performing cpr since 1911.  Patient received king airway, epi x 9 and multiple defibrillations with most rhythms v fib.  Patient without rosc during entire event.  Just prior to arrival pea on monitor.   No past medical history on file.  There are no active problems to display for this patient.    The histories are not reviewed yet. Please review them in the "History" navigator section and refresh this SmartLink.      Home Medications    Prior to Admission medications   Not on File    Family History No family history on file.  Social History Social History   Tobacco Use  . Smoking status: Not on file  Substance Use Topics  . Alcohol use: Not on file  . Drug use: Not on file     Allergies   Patient has no allergy information on record.   Review of Systems Review of Systems  Unable to perform ROS: Acuity of condition     Physical Exam Updated Vital Signs Pulse (!) 23   Resp (!) 0   Ht 1.753 m (5\' 9" )   Wt 99.8 kg   BMI 32.49 kg/m   Physical Exam Vitals signs reviewed.  Constitutional:      Comments: King airway in place Color gray  HENT:     Head: Normocephalic.     Right Ear: External ear normal.     Left Ear: External ear normal.     Nose: Nose normal.  Eyes:     Comments: Pupils midsize and nr  Neck:     Musculoskeletal: Neck supple.  Cardiovascular:     Comments: No cardiac sound auscultated Pulse with compressions only Pulmonary:     Comments: bs with bvm Abdominal:     General: There is distension.  Musculoskeletal: Normal  range of motion.  Skin:    General: Skin is dry.      ED Treatments / Results  Labs (all labs ordered are listed, but only abnormal results are displayed) Labs Reviewed - No data to display  EKG None  Radiology No results found.  Procedures Procedures (including critical care time)  Medications Ordered in ED Medications - No data to display   Initial Impression / Assessment and Plan / ED Course  I have reviewed the triage vital signs and the nursing notes.  Pertinent labs & imaging results that were available during my care of the patient were reviewed by me and considered in my medical decision making (see chart for details).    Patient assessed at bedside with ultrasound and no cardiac activity noted. CPR discontinued at 1828 Family in conference room and notified by me PMD is Dr. Judithann Sheen with [facility] in [place] level attempt to contact to see if he will sign the certificate Discussed with grandchildren who are present in ED.  The patient is a widower and the children are on a cruise currently. Discussed with Mr. Debbra Riding on call for Dr.Sparkes and we can send death certificate to  Dr.Sparkes Final Clinical Impressions(s) / ED Diagnoses   Final diagnoses:  Cardiac arrest [facility])    ED Discharge Orders    None       Margarita Grizzle, MD 2018-08-13 [id number]    Margarita Grizzle, MD Aug 13, 2018 2110

## 2018-08-23 NOTE — ED Notes (Signed)
Paged Chaplin for family

## 2018-08-23 NOTE — ED Notes (Signed)
Pt [facility] [phone]  [address] [place], [place] [address]

## 2018-08-23 NOTE — Code Documentation (Signed)
Pt was a witnessed arrest at a church, upon EMS arrival he was in v-fib, shocked 8-9 times, given 9 epi, 300 amio, 2.5 versed due to clenched jaw, now in PEA rate of 30's. Initial downtime at 1911

## 2018-08-23 DEATH — deceased
